# Patient Record
Sex: Female | Born: 1935 | Race: White | Hispanic: No | Marital: Married | Smoking: Former smoker
Health system: Southern US, Community
[De-identification: ages and names within clinical notes are randomized; demographics above are authoritative.]

## PROBLEM LIST (undated history)

## (undated) DIAGNOSIS — F329 Major depressive disorder, single episode, unspecified: Secondary | ICD-10-CM

## (undated) DIAGNOSIS — J449 Chronic obstructive pulmonary disease, unspecified: Secondary | ICD-10-CM

## (undated) DIAGNOSIS — I499 Cardiac arrhythmia, unspecified: Secondary | ICD-10-CM

## (undated) DIAGNOSIS — I10 Essential (primary) hypertension: Secondary | ICD-10-CM

## (undated) DIAGNOSIS — I639 Cerebral infarction, unspecified: Secondary | ICD-10-CM

## (undated) DIAGNOSIS — Z720 Tobacco use: Secondary | ICD-10-CM

## (undated) DIAGNOSIS — K56609 Unspecified intestinal obstruction, unspecified as to partial versus complete obstruction: Secondary | ICD-10-CM

## (undated) DIAGNOSIS — F32A Depression, unspecified: Secondary | ICD-10-CM

## (undated) DIAGNOSIS — Z950 Presence of cardiac pacemaker: Secondary | ICD-10-CM

## (undated) DIAGNOSIS — I779 Disorder of arteries and arterioles, unspecified: Secondary | ICD-10-CM

## (undated) DIAGNOSIS — E7800 Pure hypercholesterolemia, unspecified: Secondary | ICD-10-CM

## (undated) DIAGNOSIS — I739 Peripheral vascular disease, unspecified: Secondary | ICD-10-CM

## (undated) DIAGNOSIS — Z9289 Personal history of other medical treatment: Secondary | ICD-10-CM

## (undated) DIAGNOSIS — I4891 Unspecified atrial fibrillation: Secondary | ICD-10-CM

## (undated) HISTORY — DX: Cerebral infarction, unspecified: I63.9

## (undated) HISTORY — DX: Tobacco use: Z72.0

## (undated) HISTORY — DX: Chronic obstructive pulmonary disease, unspecified: J44.9

## (undated) HISTORY — PX: COLPORRHAPHY: SHX921

## (undated) HISTORY — PX: ABDOMINAL SURGERY: SHX537

## (undated) HISTORY — PX: ABDOMINAL HYSTERECTOMY: SHX81

## (undated) HISTORY — DX: Peripheral vascular disease, unspecified: I73.9

## (undated) HISTORY — PX: APPENDECTOMY: SHX54

## (undated) HISTORY — DX: Disorder of arteries and arterioles, unspecified: I77.9

## (undated) HISTORY — DX: Personal history of other medical treatment: Z92.89

## (undated) HISTORY — PX: TONSILLECTOMY: SUR1361

---

## 1999-04-29 ENCOUNTER — Other Ambulatory Visit: Admission: RE | Admit: 1999-04-29 | Discharge: 1999-04-29 | Payer: Self-pay | Admitting: Obstetrics and Gynecology

## 1999-07-01 ENCOUNTER — Other Ambulatory Visit: Admission: RE | Admit: 1999-07-01 | Discharge: 1999-07-01 | Payer: Self-pay | Admitting: Gastroenterology

## 1999-07-01 ENCOUNTER — Encounter (INDEPENDENT_AMBULATORY_CARE_PROVIDER_SITE_OTHER): Payer: Self-pay

## 2000-03-30 ENCOUNTER — Encounter: Payer: Self-pay | Admitting: Urology

## 2000-03-30 ENCOUNTER — Encounter: Admission: RE | Admit: 2000-03-30 | Discharge: 2000-03-30 | Payer: Self-pay | Admitting: Urology

## 2000-05-04 ENCOUNTER — Other Ambulatory Visit: Admission: RE | Admit: 2000-05-04 | Discharge: 2000-05-04 | Payer: Self-pay | Admitting: Obstetrics and Gynecology

## 2001-01-20 ENCOUNTER — Encounter: Payer: Self-pay | Admitting: Urology

## 2001-01-22 ENCOUNTER — Observation Stay (HOSPITAL_COMMUNITY): Admission: RE | Admit: 2001-01-22 | Discharge: 2001-01-23 | Payer: Self-pay | Admitting: Urology

## 2001-05-05 ENCOUNTER — Other Ambulatory Visit: Admission: RE | Admit: 2001-05-05 | Discharge: 2001-05-05 | Payer: Self-pay | Admitting: Obstetrics and Gynecology

## 2002-05-12 ENCOUNTER — Other Ambulatory Visit (HOSPITAL_COMMUNITY): Admission: RE | Admit: 2002-05-12 | Discharge: 2002-05-14 | Payer: Self-pay | Admitting: Psychiatry

## 2002-05-14 ENCOUNTER — Inpatient Hospital Stay (HOSPITAL_COMMUNITY): Admission: EM | Admit: 2002-05-14 | Discharge: 2002-05-18 | Payer: Self-pay | Admitting: Psychiatry

## 2002-07-13 ENCOUNTER — Encounter: Admission: RE | Admit: 2002-07-13 | Discharge: 2002-07-13 | Payer: Self-pay | Admitting: Psychiatry

## 2002-10-03 ENCOUNTER — Encounter: Admission: RE | Admit: 2002-10-03 | Discharge: 2002-10-03 | Payer: Self-pay | Admitting: Psychiatry

## 2003-01-02 ENCOUNTER — Encounter: Admission: RE | Admit: 2003-01-02 | Discharge: 2003-01-02 | Payer: Self-pay | Admitting: *Deleted

## 2007-04-16 ENCOUNTER — Encounter: Admission: RE | Admit: 2007-04-16 | Discharge: 2007-04-16 | Payer: Self-pay | Admitting: Family Medicine

## 2007-05-03 ENCOUNTER — Encounter: Admission: RE | Admit: 2007-05-03 | Discharge: 2007-05-03 | Payer: Self-pay | Admitting: Sports Medicine

## 2007-08-13 ENCOUNTER — Ambulatory Visit: Payer: Self-pay | Admitting: Internal Medicine

## 2007-08-13 ENCOUNTER — Observation Stay (HOSPITAL_COMMUNITY): Admission: EM | Admit: 2007-08-13 | Discharge: 2007-08-16 | Payer: Self-pay | Admitting: Emergency Medicine

## 2007-08-13 ENCOUNTER — Ambulatory Visit: Payer: Self-pay | Admitting: Surgery

## 2007-08-13 ENCOUNTER — Encounter (INDEPENDENT_AMBULATORY_CARE_PROVIDER_SITE_OTHER): Payer: Self-pay | Admitting: Internal Medicine

## 2008-01-13 ENCOUNTER — Encounter: Admission: RE | Admit: 2008-01-13 | Discharge: 2008-02-23 | Payer: Self-pay | Admitting: Neurology

## 2009-01-23 ENCOUNTER — Encounter: Admission: RE | Admit: 2009-01-23 | Discharge: 2009-01-23 | Payer: Self-pay | Admitting: Urology

## 2009-10-11 ENCOUNTER — Ambulatory Visit (HOSPITAL_COMMUNITY): Admission: RE | Admit: 2009-10-11 | Discharge: 2009-10-11 | Payer: Self-pay | Admitting: Urology

## 2009-11-07 DIAGNOSIS — Z9289 Personal history of other medical treatment: Secondary | ICD-10-CM

## 2009-11-07 HISTORY — DX: Personal history of other medical treatment: Z92.89

## 2010-02-01 ENCOUNTER — Ambulatory Visit (HOSPITAL_COMMUNITY): Admission: RE | Admit: 2010-02-01 | Discharge: 2010-02-01 | Payer: Self-pay | Admitting: Cardiology

## 2010-05-20 ENCOUNTER — Ambulatory Visit
Admission: RE | Admit: 2010-05-20 | Discharge: 2010-05-21 | Payer: Self-pay | Source: Home / Self Care | Attending: Urology | Admitting: Urology

## 2010-05-29 ENCOUNTER — Emergency Department (HOSPITAL_COMMUNITY)
Admission: EM | Admit: 2010-05-29 | Discharge: 2010-05-29 | Payer: Self-pay | Source: Home / Self Care | Admitting: Emergency Medicine

## 2010-07-04 ENCOUNTER — Other Ambulatory Visit: Payer: Self-pay | Admitting: Cardiovascular Disease

## 2010-07-04 DIAGNOSIS — R42 Dizziness and giddiness: Secondary | ICD-10-CM

## 2010-07-11 ENCOUNTER — Ambulatory Visit
Admission: RE | Admit: 2010-07-11 | Discharge: 2010-07-11 | Disposition: A | Discharge status: Home / Self Care | Payer: 59 | Source: Ambulatory Visit | Attending: Cardiovascular Disease | Admitting: Cardiovascular Disease

## 2010-07-11 DIAGNOSIS — R42 Dizziness and giddiness: Secondary | ICD-10-CM

## 2010-08-12 LAB — BASIC METABOLIC PANEL
BUN: 14 mg/dL (ref 6–23)
CO2: 25 mEq/L (ref 19–32)
CO2: 26 mEq/L (ref 19–32)
Calcium: 8.9 mg/dL (ref 8.4–10.5)
Calcium: 9.1 mg/dL (ref 8.4–10.5)
Chloride: 107 mEq/L (ref 96–112)
Chloride: 108 mEq/L (ref 96–112)
Creatinine, Ser: 0.8 mg/dL (ref 0.4–1.2)
GFR calc Af Amer: >60 mL/min (ref >60)
GFR calc Af Amer: >60 mL/min (ref >60)
Glucose, Bld: 120 mg/dL — ABNORMAL HIGH (ref 70–99)
Sodium: 141 mEq/L (ref 135–145)

## 2010-08-12 LAB — DIFFERENTIAL
Basophils Relative: 1 % (ref 0–1)
Eosinophils Absolute: 0.2 10*3/uL (ref 0.0–0.7)
Eosinophils Relative: 2 % (ref 0–5)
Lymphs Abs: 2.1 10*3/uL (ref 0.7–4.0)
Monocytes Absolute: 1 10*3/uL (ref 0.1–1.0)
Monocytes Relative: 11 % (ref 3–12)

## 2010-08-12 LAB — CBC
Hemoglobin: 10.2 g/dL — ABNORMAL LOW (ref 12.0–15.0)
MCH: 30 pg (ref 26.0–34.0)
MCHC: 33.3 g/dL (ref 30.0–36.0)
MCV: 90 fL (ref 78.0–100.0)
RBC: 3.4 MIL/uL — ABNORMAL LOW (ref 3.87–5.11)

## 2010-08-12 LAB — PROTIME-INR
INR: 1.05 (ref 0.00–1.49)
Prothrombin Time: 13.9 seconds (ref 11.6–15.2)

## 2010-08-15 LAB — PROTIME-INR: INR: 2.5 — ABNORMAL HIGH (ref 0.00–1.49)

## 2010-10-15 NOTE — Consult Note (Signed)
NAMEBETTYANNE, [name]               ACCOUNT NO.:  [id number]   MEDICAL RECORD NO.:  [id number]          PATIENT TYPE:  INP   LOCATION:  4729                         FACILITY:  [facility]   PHYSICIAN:  Pramod P. Pearlean Brownie, MD    DATE OF BIRTH:  04/06/1932   DATE OF CONSULTATION:  DATE OF DISCHARGE:                                 CONSULTATION   REASON FOR REFERRAL:  Dizziness.   HISTORY OF PRESENT ILLNESS:  [name] is a 77 year old pleasant  Caucasian lady who was admitted 2 days ago for an episode of dizziness  after she got up from sleep and tried to get up.  She describes  dizziness as a sensation of being off balance.  She denied any nausea,  vertigo, extremity weakness, numbness and double vision.  She has also  been complaining of fatigue, weakness, decreased appetite for the last  couple of weeks.  She denies any known prior history of stroke, TIAs or  significant neurological problems.  She has noticed improvement over the  last couple of days.  She now describes the dizziness only when she  makes a sudden movement, and she gets up and moves her head a sudden  way.  This lasts only for a few seconds and the feeling passes.   PAST MEDICAL HISTORY:  1. Significant for depression.  2. Psychosis.  3. Chronic low-back.  4. Hypertension.  5. __________  6. Macular degeneration.   PAST SURGICAL HISTORY:  1. Cystocele repair.  2. Cataract removal.  3. LASIK surgery.   MEDICATIONS AT HOME:  Atenolol, hydrochlorothiazide, Paxil and Tylenol.   SOCIAL HISTORY:  The patient is retired.  She is married, lives with  husband in [place].  She quit smoking 6 months ago.  She does not  drink.   FAMILY HISTORY:  Unknown.   REVIEW OF SYSTEMS:  As documented above.   PHYSICAL EXAMINATION:  GENERAL:  Reveals a frail, elderly, Caucasian  lady who is at present not in distress.  VITAL SIGNS:  She is febrile today with temperature of 97.8, pulse rate  is 64, regular rate 18, blood pressure  160/98, sats 96% on room air.  HEENT:  Head is nontraumatic.  ENT exam is unremarkable.  NECK:  Supple.  There is no bruit.  CARDIAC;  no murmur or gallop.  LUNGS:  Clear to auscultation.  ABDOMEN:  Soft, nontender.  NEUROLOGIC:  She is pleasant, awake, alert, cooperative.  There is no  aphasia, apraxia or dysarthria.  Pupils irregular but reactive.  Movements are full range.  There is no nystagmus.  Visual acuity and  fields adequate.  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  Air conduction is greater than bone conduction on  the right, but bone conduction is greater than air conduction on the  left.  Tongue is midline.  Motor system exam reveals no upper extremity  drift, symmetric strength, tone, reflexes, coordination sensation.  The  patient is able to walk with a slightly broad-based and very cautious  gait.  She appears very nervous when she is  walking.  She is unsteady on  a  narrow base and when walking tandem.  On head shaking, she complained  of subjective dizziness, but no objective nystagmus was noted.  On  Fukuda stepping test she was clearly moved off base.  Hallpike maneuver  was not done.   DATA REVIEWED:  MRI scan of the brain shows no acute infarct.  MRA of  the brain shows no antegrade flow in the proximal left vertebral artery.  There is some flow in the terminal left vertebral artery which is likely  cross filling from the right side.   IMPRESSION:  A 77 year old lady with intermittent dizziness due to gait  ataxia which is probably a peripheral labyrinthine dysfunction.  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  I would  recommend physical, occupational therapy consults as well as vestibular  rehab.   PLAN:  Start aspirin for stroke prevention and strict control of  hypertension.  I do not believe further invasive diagnostic   neurovascular evaluation is indicated at the present time unless she  clearly has more focal neurovascular symptoms.           ______________________________  Sunny Schlein. Pearlean Brownie, MD     PPS/MEDQ  D:  08/15/2007  T:  08/16/2007  Job:  [id number]

## 2010-10-18 NOTE — Discharge Summary (Signed)
[name], [name]               ACCOUNT NO.:  [id number]   MEDICAL RECORD NO.:  [id number]          PATIENT TYPE:  OBV   LOCATION:  4729                         FACILITY:  [facility]   PHYSICIAN:  Alvester Morin, M.D.  DATE OF BIRTH:  [birth year]   DATE OF ADMISSION:  08/13/2007  DATE OF DISCHARGE:  08/16/2007                               DISCHARGE SUMMARY   DISCHARGE DIAGNOSES:  1. Dizziness likely secondary to labyrinthitis.  2. Hypertension.  3. Constipation.  4. Depression.  5. Failure to thrive.  6. Generalized weakness/deconditioning.   DISCHARGE MEDICATIONS:  Lisinopril 20 mg p.o. daily, Meclizine 12.5 mg  p.o. daily, Colace 1 mg p.o. twice daily, MiraLax 17 grams p.o. daily as  needed for constipation, Paxil 30 mg p.o. daily.   CONSULTATIONS:  [facility] was consulted in regards to the  patient's dizziness.   PROCEDURE PERFORMED:  1. Carotid Dopplers August 13, 2007, indicating no significant internal      carotid stenosis.  2. MRI of brain performed August 14, 2007, indicating no acute process.      There is age related generalized atrophy.  There is chronic small      vessel disease within the hemispheric white matter.  3. MRA of the brain performed August 14, 2007, indicating no acute      anterior circulation pathology.  There is left vertebral artery      occlusion in the neck.  There is retrograde flow from the basilar      back into the left vertebral arteries as far as the PICA.  4.  CT      scan performed August 15, 2007, indicating no acute intracranial      abnormality.   FOLLOW UP:  The patient will follow with Dr. Jeannetta Nap, her primary care  physician, in regards to her dizziness which is, again, likely related  to labyrinthitis.  She should be followed up in regards to her symptoms  and any relief obtained from taking Meclizine.   HISTORY:  [name] is a 77 year old white female with a history of  hypertension and major depressive disorder who  presented to the ED with  an episode of dizziness after waking up from sleep this a.m.  She was  walking to her bathroom and she reports that she felt dizzy while  walking and while coming back from the bathroom, continued to feel dizzy  and had an acute worsening of his.  She did not fall or lose  consciousness.  She did not have any episodes of nausea, vomiting,  diarrhea, fever, abdominal pain, chest pain, shortness of breath prior  to this or after.  She has noted that ever since she began to feel dizzy  this morning she has continually felt dizzy while sitting up.  Again,  while lying down, her dizziness completely resolves.  She has noted that  she has been feeling somewhat more fatigued, weak, and had decreased  p.o. intake and decreased appetite and some social isolation over the  last two weeks.  She feels like her depression is getting worse.   PHYSICAL  EXAMINATION:  VITALS:  Temperature 97, blood pressure 163/70,  pulse 57, respirations 17, and saturation 99% on 2 liters.  GENERAL:  The patient was not in any acute distress.  She is resting  comfortably.  EYES:  Pupils equally round and reactive to light.  Extraocular eye  movements intact.  Peripheral vision intact.  ENT: Oropharynx clear.  There is no erythema or exudates.  NECK:  Supple.  No lymphadenopathy.  No carotid bruits.  RESPIRATORY:  Lungs were clear bilaterally with good air movement.  CARDIOVASCULAR:  Regular rate and rhythm.  No murmurs, rubs or gallops.  GI: Soft, nontender, nondistended.  Good bowel sounds.  EXTREMITIES:  Non-edematous.  SKIN:  No rashes found.  NEURO:  Alert and oriented x3.  Cranial nerves II-XII intact.  Muscle  strength +5 in all extremities.  Reflexes are +2 bilaterally in the  lower extremities.  Sensation is intact to light touch.  Finger-to-nose  test is normal bilaterally.  Gait is not assessed secondary to  dizziness.  PSYCHIATRIC:  The patient is appropriately interactive but  does have a  flat affect.   ADMITTING LABS:  Orthostatic blood pressures are negative for signs of  orthostatic hypotension.  White count 12.3, hemoglobin 15, platelet  count 262.  Sodium 139, potassium 3.2, chloride 103, bicarb 30, BUN 19,  creatinine 0.9, and glucose 111.   HOSPITAL COURSE:  Problem 1:  Dizziness.  The differential for the patient's dizziness  originally included labyrinthitis, benign positional vertigo,  orthostatic hypotension, CVA, cardiogenic causes such as bradycardia.  We obtained orthostatics blood pressures on the patient while she was  continuing to have problems with dizziness and they were negative.  We  obtained an MRI and this did not show any signs of a stroke.  We  obtained carotid Dopplers that were completely normal.  We did obtain a  neurology consult and Dr. Pearlean Brownie found that the patient most likely had  dizziness secondary to peripheral labyrinthitis.  He did note that her  left vertebral artery occlusion was likely unrelated.  He recommended  physical therapy, occupational therapy, aspirin, and a trial of  Meclizine.  After receiving IV fluids and general supportive care, the  patient did have improved symptoms during the course of her stay.  She  was ambulating with assistance and tolerating her Meclizine.  We decided  to let the patient go home with close hospital follow-up with her  regular primary care doctor.  Again, we will continue meclizine in the  hopes that it will help her labyrinthitis and ultimately help her regain  the same function as she had prior to feeling dizzy.   Problem 2:  Hypertension.  The patient's blood pressure was stable  during the course of her stay.  Her systolics were generally held below  160.  We discontinued the patient's atenolol because of worry that it  could be causing bradycardia which ultimately could be contributing to  her dizziness.  We continued the patient on lisinopril 20 mg p.o. daily.  Again, she  will need to be followed up on the therapy in regards to her  blood pressure by her primary care physician.   Problem 3:  Depression. The patient has significant depression with  anhedonia, decreased energy, psychomotor retardation, decreased  appetite, and social isolation.  The patient is refusing any type of  psychotherapy.  She is taking Paxil and during the course of her  hospitalization, we chose to increase the dose to 30 mg  p.o. daily.  Considering that she is refusing psychotherapy, the options of treating  her depression is somewhat limited.  We hope that her primary care  physician can help improve her depressive symptoms in the future.   Problem 4:  Constipation.  The patient has a history of chronic  constipation.  We placed her on Colace and MiraLax.  The last  colonoscopy the patient had was five years ago.  She does not know the  results of that.  She likely needs another colonoscopy.  We have  reviewed her medications and it appears that Paxil can cause  constipation in a significant amount of patients.  Given the severity of  her depression, however, we feel that we should continue the Paxil and  try to treat her constipation symptomatically.  So we continued the  patient's MiraLax and Colace on an outpatient basis.   Discharge vital signs:  Temperature 97.8, blood pressure 160/98, pulse  64, respirations 18, saturation 96% on room air.      Lollie Sails, MD  Electronically Signed      Alvester Morin, M.D.  Electronically Signed    CB/MEDQ  D:  09/15/2007  T:  09/16/2007  Job:  [id number]   cc:   Windle Guard, M.D.

## 2010-10-18 NOTE — Op Note (Signed)
[facility]  Patient:    KYRAN, WHITTIER Visit Number: [id number] MRN: [id number]          Service Type: SUR Location: 3W [id number] 01 Attending Physician:  Londell Moh Proc. Date: 01/22/01 Adm. Date:  01/22/2001 Disc. Date: 01/23/2001                             Operative Report  PREOPERATIVE DIAGNOSES: 1. Stress urinary incontinence. 2. Cystocele.  POSTOPERATIVE DIAGNOSES: 1. Stress urinary incontinence. 2. Cystocele.  PROCEDURE:  Cystoscopy, pubovaginal sling, anterior repair, and suprapubic tube placement.  SURGEON:  Jamison Neighbor, M.D.  ANESTHESIA:  General.  COMPLICATIONS:  None.  DRAINS:  Bonnano Cystocath.  BRIEF HISTORY:  This 77 year old female has been evaluated by me for problems with stress urinary incontinence as well as a large, prolapsing cystocele that has caused a significant amount of lower abdominal pressure and pain.  The patient had a standing cystogram done which showed a large, prolapsing cystocele with an elevated postvoid residual.  It also showed that she did have some leakage of the bladder.  The patient underwent cystoscopic examination.  There were no signs of interstitial cystitis or other sources for pain.  Urodynamic evaluation showed a large capacity bladder with some degree of bladder contractility and irritability trying to empty her bladder, but she did not empty particularly well due to the obstructing cystocele.  The patient is now to undergo correction of the cystocele with placement of a sling.  She understands the risks and benefits of the procedure and gave full and informed consent.  DESCRIPTION OF PROCEDURE:  After the successful induction of general anesthesia, the patient was placed in the dorsal lithotomy position and prepped with Betadine and draped in the usual sterile fashion.  Careful bimanual examination showed that she had a large, prolapsing cystocele, prolapsing well out  beyond the introitus.  There was a modest rectocele which is not symptomatic for the patient.  There were no signs of enterocele.  The anterior vaginal mucosa was infiltrated with local anesthetic.  A weighted vaginal speculum was placed, and the labia were sutured at the medial thigh for exposure.  Foley catheter was inserted.  An incision was made from the mid urethra all the way back to the vaginal cuff.  Flaps were raised bilaterally. The cystocele was developed through sharp dissection.  The space of Retzius was entered on each side, and the urethra was mobilized.  The cardinal ligaments were pulled together posteriorly with Vicryl sutures, and then the cystocele was corrected by correcting the central defect to the middle through the use of a series of Vicryl sutures.  A sling was then constructed out of a 4 x 7 piece of dermis.  This was shaped into a T-shape with the long end connected to #1 nylon sutures.  The patient had a small incision made directly above the pubic bone.  A tonsil clamp was passed through the space of Retzius on each side from the abdominal incision down to the vaginal incision. This was used to pull the #1 sutures up bilaterally.  The sling was positioned and tacked in place with a series of 2-0 Vicryls so that it completely covered over the repair and allowed for elevation of the entire bladder base.  The bottom of the sling was tacked down at the level of the cardinal ligaments. The anterior vaginal mucosa was then trimmed and closed with a series  of 2-0 Vicryl sutures.  The labial sutures were taken down.  The weighted vaginal speculum was removed.  Cystoscopy was performed, and the bladder was carefully inspected.  It was free of any tumor or stones.  The ureteral orifices were identified.  Clear urine was seen to efflux from each.  The bladder base had been distorted by the cystocele repair, but the ureters did not appear to have been injured in any way.   The lateral aspects of the bladder were carefully inspected with both 12 degree and 70 degree lenses.  There was no evidence of any suture material or sling material anywhere within the bladder.  Elevation of the sling showed good correction of the bladder neck.  Under direct vision, a Bonnano Cystocath was passed from a small stab wound into the bladder.  This was sutured in place with nylon sutures.  The sling was then tied down with an appropriate degree of tension.  With a full bladder, a crede maneuver showed a prompt, straight flow of urine, but there was no leakage without crede.  The patient had two fingers that could be placed underneath the sling as it was tied down.  There were 30-45 degrees of free play with the cystoscope, and visual inspection showed that there was coaptation but not angulation of the urethra.  The patient tolerated the procedure well and was taken to the recovery room in good condition.  She had a vaginal pack in place.  She will have 23-hour observation and will be discharged home tomorrow with pain medication as well as doxycycline.  She will be given instructions on how to use her suprapubic tube and will return to see [place] in follow-up. Attending Physician:  Londell Moh DD:  01/22/01 TD:  01/23/01 Job: [id number]

## 2010-10-18 NOTE — H&P (Signed)
[name], [name]                         ACCOUNT NO.:  [id number]   MEDICAL RECORD NO.:  [id number]                   PATIENT TYPE:  IPS   LOCATION:  0303                                 FACILITY:  [facility]   PHYSICIAN:  Geoffery Lyons, M.D.                   DATE OF BIRTH:  10-Dec-1931   DATE OF ADMISSION:  05/13/2002  DATE OF DISCHARGE:                         PSYCHIATRIC ADMISSION ASSESSMENT   DATE OF ASSESSMENT:  May 14, 2002   PATIENT IDENTIFICATION:  This is a 77 year old married white female who is a  voluntary admission.   HISTORY OF PRESENT ILLNESS:  This 77 year old white female with no prior  psychiatric hospitalization had started some treatment in the outpatient  clinic for tearfulness with depressed mood but was found crying yesterday in  the waiting room, holding a chair in the fetal position, and said to the  staff, Please help me, please help me.  The patient apparently has had a  deterioration in her mood with frequent crying episodes and poor  concentration.  She has been unable to focus on her daily activities or  accomplish her activities of daily living with any consistency.  She has  been fearful and anxious today.  She is perseverating on her husband and his  spending habits, feels that he is going spend them into the poor house.  The  patient's mother also died two years ago of Alzheimer's disease and the  patient has had some feelings of guilt about putting her mother in a nursing  home.  The patient also reports that she had a poor relationship with her  mother and that her mother had always told her repeatedly that she had  regretted having her.  The patient also has a history of bladder surgery and  has been recently worried that there might be additional physical problems.  The patient reports that she has some distant history of depression that  apparently was never treated after she had some children.  The patient,  today, reports that she has  been repeatedly fatigued, has not been able to  sleep or have any restful sleep for the past three months.   PAST PSYCHIATRIC HISTORY:  The patient has no history of prior psychiatric  treatment or inpatient hospitalization.  She had been established at [facility] and had one appointment on  the 11th.  At that time, she had been started on Lexapro 5 mg.  She denies  any prior history of suicide attempts.   SUBSTANCE ABUSE HISTORY:  The patient has no history of substance abuse  other than tobacco abuse.  She states she quit smoking approximately two  months ago.   PAST MEDICAL HISTORY:  The patient has been followed by Dr. Jeannetta Nap in  [place], [place].  Medical problems include a history of  hypertension.  The patient is also status post  contusion to her coccyx after  a fall in May 2003 from which she describes chronic pain.  Past medical  history is remarkable for tonsillectomy, adenoidectomy, total abdominal  hysterectomy, appendectomy in the past.  The patient has also had bladder  repair with a bladder sling procedure and she fears that she may have  ruptured this or somehow disrupted this surgical intervention after she fell  in May 2003, although she is unable to describe any specific symptoms  attached to her fears.  The patient has a distant history of multiple  urinary tract infections.   MEDICATIONS:  1. Atenolol 50 mg p.o. daily.  2. Hydrochlorothiazide 25 mg p.o. daily.  3. Lexapro 5 mg daily started December 5.   DRUG ALLERGIES:  SULFA.   REVIEW OF SYSTEMS:  Review of systems is remarkable for some lower back  pain, which is reportedly a 4/10 and is worse when the patient sits for long  periods of time.  She has been taking routine Tylenol or ibuprofen for this  pain.  Her reports of this pain have fluctuated from moderate amounts of  pain if she sits for times and is very tearful to no back pain at all at  the  time the nurses were assessing her.  Review of systems is also remarkable  for significant loss of sleep.  The patient reports she has been unable to  sleep more than two or three hours a night and also has nightmares and night  sweats at night.   PHYSICAL EXAMINATION:  GENERAL:  This is a well nourished, well developed,  medium built female who appears to be her stated age of 16.  She is  generally cooperative but mildly agitated and tearful, unable to collect her  thoughts and has quite a bit of difficulty interviewing because of thought  blocking.  VITAL SIGNS:  On admission to the unit, temperature 97.3, pulse 88, blood  pressure 153/94.  She is 5 feet 3 inches and weighed 134 pounds.  HEENT:  Head is normocephalic, atraumatic.  EENT: Sclerae are nonicteric.  PERRLA.  Hearing intact to normal voice.  No rhinorrhea.  Oropharynx is in  satisfactory condition, noninjected.  NECK:  Supple, no thyromegaly, no masses palpated, full range of motion  without tenderness.  CARDIOVASCULAR:  S1 and S2 heard, no clicks, murmurs, or gallops, no extra  heart sounds.  Apical pulse is synchronous with radial pulse.  No edema of  her lower extremities.  No JVD.  LUNGS:  Clear to auscultation.  ABDOMEN:  Flat, soft, and quiet, no tenderness to palpation, no masses  appreciated.  GENITALIA:  Deferred.  BREAST:  Exam is deferred.  MUSCULOSKELETAL:  Spine is generally straight, posture upright.  Strength is  5/5 throughout.  No swelling or deformity noted of joints, although she does  have some mild joint enlargement in her fingers and her hands.  NEUROLOGIC:  Cranial nerves II-XII are intact.  EOM are intact.  No evidence  of nystagmus.  She does display some psychomotor slowing and some slowed  motor movements, seems to be a bit perplexed and lost at times but motor  movements are generally smooth, no tremor.  Sensory is grossly intact. Facial symmetry is present.  Deep tendon reflexes are 1+/5  and are sluggish  by symmetrical.  Romberg: Without findings.   LABORATORY DATA:  Diagnostic studies reveal the patient had mildly white  blood cell count 11.9, hemoglobin 15.8, hematocrit 45.5.  Her electrolytes  are  within normal limits; BUN 10, creatinine 0.7.  Thyroid panel reveals  normal TSH at 1.253, T4 is mildly elevated at 11.1.  Routine UA and urine  drug screen is currently pending.   SOCIAL HISTORY:  The patient has a ninth grade education.  She reportedly  had a poor relationship with her mother as noted above.  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  The patient has been married for the past 47 years  and feels that generally, she has had a satisfactory marriage.  She has at  least one son age 57 years old who lives in a nearby town.   FAMILY HISTORY:  Family history is unclear.   MENTAL STATUS EXAM:  This is a medium-built, older female whose general  hygiene is satisfactory.  She is fully alert.  She is crying with mild  agitation.  She has significant thought blocking and has difficulty  gathering her thoughts or expressing herself in any way other than to whine  and express her fears.  Speech is mildly pressured.  Her manner is generally  cooperative.  She is directable.  Mood is depressed and tearful.  Thought  process is slowed.  She has trouble processing information or instruction,  needs gentle direction, coaxing, and cuing for all activities.  No overt  evidence of homicidal ideation or overt suicidal ideation.  She does have  significant thought blocking.  Her thoughts are dominated her concerns about  her own physical health and fears that her husband is going to make them  poor with his spending, although she is unable to clarify her thinking into  any specific complaints or goal-oriented actions.  She does seem  to have  possibly some vague paranoia.  No evidence of any auditory hallucinations or  internal distractions.  Cognitive: Intact and oriented x 3.  Her  concentration is poor.  Insight: Poor.  Judgment: Impaired.  Impulse  control: Questionable.  The patient is an unreliable historian.   ADMISSION DIAGNOSES:   AXIS I:  Major depressive disorder, recurrent, severe with  psychosis/agitation.   AXIS II:  No diagnosis.   AXIS III:  1. Chronic low back pain.  2. Hypertension.  3. Rule out urinary tract infection.   AXIS IV:  Deferred.   AXIS V:  Current --, past year --   INITIAL PLAN OF CARE:  Plan is to voluntarily admit the patient with q.66m.  checks in place with goal to alleviate her agitation, improve her focus,  alleviate her tearfulness, and ensure she can be safe and focus and  accomplish her ADLs.  We are going to check a routine urine drug screen and a routine urinalysis.  Meanwhile, we have started her back on her Lexapro 5  mg daily.  We are really unclear if she was taking this at home or not.  We  have also decided to give her Zyprexa 2.5 mg q.8h. p.r.n. for these episodes  of agitation.  We may add a continuous dose later when we see how she  responds.  We also are giving her Ambien 10 mg p.r.n. at h.s. for insomnia.   ESTIMATED LENGTH OF STAY:  Six days.     Margaret A. Scott, N.P.  Geoffery Lyons, M.D.    MAS/MEDQ  D:  05/16/2002  T:  05/16/2002  Job:  [id number]

## 2011-02-24 LAB — CBC
HCT: 43.5
Hemoglobin: 12.6
Hemoglobin: 15
MCHC: 33.7
MCV: 85.9
MCV: 87.2
Platelets: 262
RBC: 4.3
RBC: 5.07
WBC: 12.3 — ABNORMAL HIGH
WBC: 8.1

## 2011-02-24 LAB — POCT I-STAT CREATININE
Creatinine, Ser: 0.9
Operator id: 270651

## 2011-02-24 LAB — I-STAT 8, (EC8 V) (CONVERTED LAB)
BUN: 19
Chloride: 103
Glucose, Bld: 111 — ABNORMAL HIGH
HCT: 47 — ABNORMAL HIGH
Hemoglobin: 16 — ABNORMAL HIGH
Operator id: 270651
pCO2, Ven: 48.9

## 2011-02-24 LAB — BASIC METABOLIC PANEL
CO2: 29
Chloride: 106
GFR calc Af Amer: >60
Sodium: 140

## 2011-02-24 LAB — COMPREHENSIVE METABOLIC PANEL
ALT: 14
Alkaline Phosphatase: 41
CO2: 29
Chloride: 103
GFR calc non Af Amer: >60
Glucose, Bld: 112 — ABNORMAL HIGH
Potassium: 3.5
Sodium: 140
Total Bilirubin: 0.5

## 2011-02-24 LAB — DIFFERENTIAL
Eosinophils Relative: 0
Lymphocytes Relative: 18
Lymphs Abs: 2.2
Monocytes Absolute: 0.7
Monocytes Relative: 6

## 2011-02-24 LAB — CARDIAC PANEL(CRET KIN+CKTOT+MB+TROPI)
CK, MB: 0.8
CK, MB: 0.9
Relative Index: INVALID

## 2011-02-24 LAB — MAGNESIUM
Magnesium: 2
Magnesium: 2.1

## 2011-02-24 LAB — URINALYSIS, ROUTINE W REFLEX MICROSCOPIC
Bilirubin Urine: NEGATIVE
Ketones, ur: NEGATIVE
Nitrite: NEGATIVE
Specific Gravity, Urine: 1.019
Urobilinogen, UA: 1

## 2011-02-24 LAB — SEDIMENTATION RATE: Sed Rate: 1

## 2011-02-24 LAB — CK TOTAL AND CKMB (NOT AT ARMC): Relative Index: INVALID

## 2011-09-29 ENCOUNTER — Encounter (INDEPENDENT_AMBULATORY_CARE_PROVIDER_SITE_OTHER): Payer: Medicare Other | Admitting: Ophthalmology

## 2011-09-29 DIAGNOSIS — H43819 Vitreous degeneration, unspecified eye: Secondary | ICD-10-CM

## 2011-09-29 DIAGNOSIS — H3530 Unspecified macular degeneration: Secondary | ICD-10-CM

## 2011-09-29 DIAGNOSIS — H26499 Other secondary cataract, unspecified eye: Secondary | ICD-10-CM

## 2011-10-07 ENCOUNTER — Ambulatory Visit (INDEPENDENT_AMBULATORY_CARE_PROVIDER_SITE_OTHER): Payer: Medicare Other | Admitting: Ophthalmology

## 2011-10-09 ENCOUNTER — Ambulatory Visit (INDEPENDENT_AMBULATORY_CARE_PROVIDER_SITE_OTHER): Payer: Medicare Other | Admitting: Ophthalmology

## 2011-10-09 DIAGNOSIS — H2700 Aphakia, unspecified eye: Secondary | ICD-10-CM

## 2011-10-09 DIAGNOSIS — H3530 Unspecified macular degeneration: Secondary | ICD-10-CM

## 2012-05-17 ENCOUNTER — Emergency Department (HOSPITAL_COMMUNITY): Payer: Medicare Other

## 2012-05-17 ENCOUNTER — Encounter (HOSPITAL_COMMUNITY): Payer: Self-pay | Admitting: Adult Health

## 2012-05-17 ENCOUNTER — Inpatient Hospital Stay (HOSPITAL_COMMUNITY)
Admission: EM | Admit: 2012-05-17 | Discharge: 2012-05-24 | DRG: 193 | Disposition: A | Discharge status: Skilled Nursing Facility | Payer: Medicare Other | Attending: Internal Medicine | Admitting: Internal Medicine

## 2012-05-17 DIAGNOSIS — I480 Paroxysmal atrial fibrillation: Secondary | ICD-10-CM | POA: Diagnosis present

## 2012-05-17 DIAGNOSIS — I5033 Acute on chronic diastolic (congestive) heart failure: Secondary | ICD-10-CM | POA: Diagnosis present

## 2012-05-17 DIAGNOSIS — I5031 Acute diastolic (congestive) heart failure: Secondary | ICD-10-CM

## 2012-05-17 DIAGNOSIS — I2510 Atherosclerotic heart disease of native coronary artery without angina pectoris: Secondary | ICD-10-CM | POA: Diagnosis present

## 2012-05-17 DIAGNOSIS — J9691 Respiratory failure, unspecified with hypoxia: Secondary | ICD-10-CM

## 2012-05-17 DIAGNOSIS — F3289 Other specified depressive episodes: Secondary | ICD-10-CM | POA: Diagnosis present

## 2012-05-17 DIAGNOSIS — A419 Sepsis, unspecified organism: Secondary | ICD-10-CM

## 2012-05-17 DIAGNOSIS — I4891 Unspecified atrial fibrillation: Secondary | ICD-10-CM | POA: Diagnosis present

## 2012-05-17 DIAGNOSIS — I509 Heart failure, unspecified: Secondary | ICD-10-CM

## 2012-05-17 DIAGNOSIS — J9611 Chronic respiratory failure with hypoxia: Secondary | ICD-10-CM | POA: Diagnosis present

## 2012-05-17 DIAGNOSIS — E876 Hypokalemia: Secondary | ICD-10-CM | POA: Diagnosis present

## 2012-05-17 DIAGNOSIS — I10 Essential (primary) hypertension: Secondary | ICD-10-CM | POA: Diagnosis present

## 2012-05-17 DIAGNOSIS — J811 Chronic pulmonary edema: Secondary | ICD-10-CM

## 2012-05-17 DIAGNOSIS — Z7902 Long term (current) use of antithrombotics/antiplatelets: Secondary | ICD-10-CM

## 2012-05-17 DIAGNOSIS — E7800 Pure hypercholesterolemia, unspecified: Secondary | ICD-10-CM | POA: Diagnosis present

## 2012-05-17 DIAGNOSIS — Z79899 Other long term (current) drug therapy: Secondary | ICD-10-CM

## 2012-05-17 DIAGNOSIS — Z7982 Long term (current) use of aspirin: Secondary | ICD-10-CM

## 2012-05-17 DIAGNOSIS — J9600 Acute respiratory failure, unspecified whether with hypoxia or hypercapnia: Secondary | ICD-10-CM | POA: Diagnosis present

## 2012-05-17 DIAGNOSIS — F329 Major depressive disorder, single episode, unspecified: Secondary | ICD-10-CM | POA: Diagnosis present

## 2012-05-17 DIAGNOSIS — D72829 Elevated white blood cell count, unspecified: Secondary | ICD-10-CM | POA: Diagnosis present

## 2012-05-17 DIAGNOSIS — J8410 Pulmonary fibrosis, unspecified: Secondary | ICD-10-CM | POA: Diagnosis present

## 2012-05-17 DIAGNOSIS — J189 Pneumonia, unspecified organism: Principal | ICD-10-CM | POA: Diagnosis present

## 2012-05-17 HISTORY — DX: Pure hypercholesterolemia, unspecified: E78.00

## 2012-05-17 HISTORY — DX: Depression, unspecified: F32.A

## 2012-05-17 HISTORY — DX: Cardiac arrhythmia, unspecified: I49.9

## 2012-05-17 HISTORY — DX: Essential (primary) hypertension: I10

## 2012-05-17 HISTORY — DX: Unspecified atrial fibrillation: I48.91

## 2012-05-17 HISTORY — DX: Major depressive disorder, single episode, unspecified: F32.9

## 2012-05-17 LAB — URINE MICROSCOPIC-ADD ON

## 2012-05-17 LAB — BASIC METABOLIC PANEL
Calcium: 8.8 mg/dL (ref 8.4–10.5)
GFR calc Af Amer: >90 mL/min (ref >90)
GFR calc non Af Amer: 85 mL/min — ABNORMAL LOW (ref >90)
Glucose, Bld: 143 mg/dL — ABNORMAL HIGH (ref 70–99)
Potassium: 3 mEq/L — ABNORMAL LOW (ref 3.5–5.1)
Sodium: 137 mEq/L (ref 135–145)

## 2012-05-17 LAB — POCT I-STAT 3, ART BLOOD GAS (G3+)
Acid-Base Excess: 5 mmol/L — ABNORMAL HIGH (ref 0.0–2.0)
Bicarbonate: 26.8 mEq/L — ABNORMAL HIGH (ref 20.0–24.0)
O2 Saturation: 95 %
Patient temperature: 98.4
TCO2: 28 mmol/L (ref 0–100)
pO2, Arterial: 66 mmHg — ABNORMAL LOW (ref 80.0–100.0)

## 2012-05-17 LAB — URINALYSIS, ROUTINE W REFLEX MICROSCOPIC
Glucose, UA: NEGATIVE mg/dL
Nitrite: NEGATIVE
Nitrite: NEGATIVE
Protein, ur: NEGATIVE mg/dL
Specific Gravity, Urine: 1.006 (ref 1.005–1.030)
Specific Gravity, Urine: 1.006 (ref 1.005–1.030)
Urobilinogen, UA: 1 mg/dL (ref 0.0–1.0)
pH: 7.5 (ref 5.0–8.0)

## 2012-05-17 LAB — CBC
Hemoglobin: 13.9 g/dL (ref 12.0–15.0)
MCH: 29.2 pg (ref 26.0–34.0)
MCHC: 33.9 g/dL (ref 30.0–36.0)
Platelets: 377 10*3/uL (ref 150–400)
RBC: 4.76 MIL/uL (ref 3.87–5.11)

## 2012-05-17 LAB — POCT I-STAT TROPONIN I: Troponin i, poc: 0.06 ng/mL (ref 0.00–0.08)

## 2012-05-17 MED ORDER — LEVOFLOXACIN IN D5W 750 MG/150ML IV SOLN
750.0000 mg | Freq: Once | INTRAVENOUS | Status: AC
  Administered 2012-05-17: 750 mg via INTRAVENOUS
  Filled 2012-05-17: qty 150

## 2012-05-17 MED ORDER — ASPIRIN 81 MG PO CHEW
CHEWABLE_TABLET | ORAL | Status: AC
  Filled 2012-05-17: qty 1

## 2012-05-17 MED ORDER — SODIUM CHLORIDE 0.9 % IV SOLN
1000.0000 mL | Freq: Once | INTRAVENOUS | Status: AC
  Administered 2012-05-17: 1000 mL via INTRAVENOUS

## 2012-05-17 MED ORDER — SODIUM CHLORIDE 0.9 % IV SOLN
1000.0000 mL | INTRAVENOUS | Status: DC
  Administered 2012-05-17: 1000 mL via INTRAVENOUS

## 2012-05-17 MED ORDER — DEXTROSE 5 % IV SOLN
2.0000 g | Freq: Once | INTRAVENOUS | Status: AC
  Administered 2012-05-17: 2 g via INTRAVENOUS
  Filled 2012-05-17 (×2): qty 2

## 2012-05-17 MED ORDER — DIGOXIN 125 MCG PO TABS
0.5000 mg | ORAL_TABLET | Freq: Once | ORAL | Status: AC
  Administered 2012-05-17: 0.5 mg via ORAL
  Filled 2012-05-17: qty 4

## 2012-05-17 MED ORDER — ALBUTEROL SULFATE (5 MG/ML) 0.5% IN NEBU
2.5000 mg | INHALATION_SOLUTION | RESPIRATORY_TRACT | Status: DC
  Administered 2012-05-17 – 2012-05-20 (×12): 2.5 mg via RESPIRATORY_TRACT
  Filled 2012-05-17 (×11): qty 0.5

## 2012-05-17 MED ORDER — FUROSEMIDE 10 MG/ML IJ SOLN
40.0000 mg | Freq: Two times a day (BID) | INTRAMUSCULAR | Status: AC
  Administered 2012-05-17 – 2012-05-19 (×4): 40 mg via INTRAVENOUS
  Filled 2012-05-17 (×4): qty 4

## 2012-05-17 MED ORDER — ASPIRIN EC 81 MG PO TBEC
81.0000 mg | DELAYED_RELEASE_TABLET | Freq: Every day | ORAL | Status: DC
  Administered 2012-05-18 – 2012-05-24 (×7): 81 mg via ORAL
  Filled 2012-05-17 (×9): qty 1

## 2012-05-17 MED ORDER — POTASSIUM CHLORIDE CRYS ER 20 MEQ PO TBCR
80.0000 meq | EXTENDED_RELEASE_TABLET | Freq: Once | ORAL | Status: AC
  Administered 2012-05-17: 80 meq via ORAL
  Filled 2012-05-17: qty 4

## 2012-05-17 MED ORDER — IPRATROPIUM BROMIDE 0.02 % IN SOLN
0.5000 mg | RESPIRATORY_TRACT | Status: DC | PRN
  Administered 2012-05-18: 0.5 mg via RESPIRATORY_TRACT
  Filled 2012-05-17 (×2): qty 2.5

## 2012-05-17 MED ORDER — IPRATROPIUM BROMIDE 0.02 % IN SOLN
0.5000 mg | RESPIRATORY_TRACT | Status: DC
  Administered 2012-05-17 – 2012-05-20 (×14): 0.5 mg via RESPIRATORY_TRACT
  Filled 2012-05-17 (×15): qty 2.5

## 2012-05-17 MED ORDER — DEXTROSE 5 % IV SOLN
2.0000 g | Freq: Three times a day (TID) | INTRAVENOUS | Status: DC
  Administered 2012-05-18 – 2012-05-20 (×8): 2 g via INTRAVENOUS
  Filled 2012-05-17 (×10): qty 2

## 2012-05-17 MED ORDER — BIOTENE DRY MOUTH MT LIQD
15.0000 mL | Freq: Two times a day (BID) | OROMUCOSAL | Status: DC
  Administered 2012-05-17 – 2012-05-20 (×6): 15 mL via OROMUCOSAL

## 2012-05-17 MED ORDER — LEVOFLOXACIN IN D5W 750 MG/150ML IV SOLN
750.0000 mg | INTRAVENOUS | Status: DC
  Administered 2012-05-18 – 2012-05-20 (×3): 750 mg via INTRAVENOUS
  Filled 2012-05-17 (×4): qty 150

## 2012-05-17 MED ORDER — VANCOMYCIN HCL IN DEXTROSE 1-5 GM/200ML-% IV SOLN
1000.0000 mg | INTRAVENOUS | Status: DC
  Administered 2012-05-18: 1000 mg via INTRAVENOUS
  Filled 2012-05-17 (×2): qty 200

## 2012-05-17 MED ORDER — SIMVASTATIN 20 MG PO TABS
20.0000 mg | ORAL_TABLET | Freq: Every day | ORAL | Status: DC
  Administered 2012-05-18 – 2012-05-19 (×2): 20 mg via ORAL
  Filled 2012-05-17 (×3): qty 1

## 2012-05-17 MED ORDER — HEPARIN SODIUM (PORCINE) 5000 UNIT/ML IJ SOLN
5000.0000 [IU] | Freq: Three times a day (TID) | INTRAMUSCULAR | Status: DC
  Administered 2012-05-18 – 2012-05-19 (×6): 5000 [IU] via SUBCUTANEOUS
  Filled 2012-05-17 (×7): qty 1

## 2012-05-17 MED ORDER — FUROSEMIDE 10 MG/ML IJ SOLN
40.0000 mg | Freq: Once | INTRAMUSCULAR | Status: AC
  Administered 2012-05-17: 40 mg via INTRAVENOUS
  Filled 2012-05-17: qty 4

## 2012-05-17 MED ORDER — ALBUTEROL SULFATE (5 MG/ML) 0.5% IN NEBU
2.5000 mg | INHALATION_SOLUTION | RESPIRATORY_TRACT | Status: DC
  Administered 2012-05-17 (×2): 2.5 mg via RESPIRATORY_TRACT
  Filled 2012-05-17 (×3): qty 0.5

## 2012-05-17 MED ORDER — ALBUTEROL SULFATE (5 MG/ML) 0.5% IN NEBU
2.5000 mg | INHALATION_SOLUTION | RESPIRATORY_TRACT | Status: DC | PRN
  Administered 2012-05-18: 2.5 mg via RESPIRATORY_TRACT
  Filled 2012-05-17 (×2): qty 0.5

## 2012-05-17 MED ORDER — CLOPIDOGREL BISULFATE 75 MG PO TABS
75.0000 mg | ORAL_TABLET | Freq: Every day | ORAL | Status: DC
  Administered 2012-05-18 – 2012-05-20 (×4): 75 mg via ORAL
  Filled 2012-05-17 (×6): qty 1

## 2012-05-17 MED ORDER — DILTIAZEM HCL 100 MG IV SOLR
5.0000 mg/h | INTRAVENOUS | Status: DC
  Administered 2012-05-17: 5 mg/h via INTRAVENOUS
  Administered 2012-05-17: 15 mg/h via INTRAVENOUS
  Administered 2012-05-18: 5 mg/h via INTRAVENOUS
  Filled 2012-05-17 (×2): qty 100

## 2012-05-17 NOTE — ED Notes (Signed)
One week of Shortness of breath. Went to Dr. Isidore Moos today and sent here for further evaluation. Pt is tachypneic, with accessory muscle use sats 94-96% on 2 liters. Heart rate 130s. +cough that is productive thick phlegm, c/o body aches.

## 2012-05-17 NOTE — ED Notes (Signed)
istat lactic acid reported to Dr.Lockwood  

## 2012-05-17 NOTE — H&P (Signed)
PULMONARY  / CRITICAL CARE MEDICINE  Name: [name] MRN: [id number] DOB: [birth year]    LOS: 0  REFERRING MD:  EDP  CHIEF COMPLAINT:  Acute dyspnea  BRIEF PATIENT DESCRIPTION: 78 yo with history of atrial fibrillation who presented to [facility] ED om 12/16 with dyspnea after being seen by her PCP.  Seen one week ago with influenza symptoms and started on Tamiflu.  Since then had increased respiratory symptoms, weakness and decreased fluid intake as well as fevers. Denies chest pain or pedal edema. In ED noted to be in atrial fibrillation with RVR.  LINES / TUBES: 12/16  Foley  CULTURES: 12/16  Urine >>> 12/16  Blood >>>  ANTIBIOTICS: Aztreonam (pneumonia, PCN allergy) >>> Levaquin (pneumonia, atypical) >>>  Vancomycin (pneumonia, community MRSA) >>>  SIGNIFICANT EVENTS:  12/16  Admitted with pneumonia / pulmonary edema, afib/RVR, impending respiratory failure  LEVEL OF CARE:  ICU  PRIMARY SERVICE:  PCCN  CONSULTANTS:    CODE STATUS:  Full  DIET:  NPO  DVT Px:  Heparin  GI Px:  Not indicated  HISTORY OF PRESENT ILLNESS:  78 yo with history of atrial fibrillation who presented to [facility] ED om 12/16 with dyspnea after being seen by her PCP.  Seen one week ago with influenza symptoms and started on Tamiflu.  Since then had increased respiratory symptoms, weakness and decreased fluid intake as well as fevers.  In ED noted to be in atrial fibrillation with RVR.  Denies chest pain or pedal edema.  PAST MEDICAL HISTORY :  Past Medical History  Diagnosis Date  . Irregular heart beat   . Hypertension   . Hypercholesteremia   . Depression   . Atrial fibrillation    History reviewed. No pertinent past surgical history. Prior to Admission medications   Medication Sig Start Date End Date Taking? Authorizing Provider  acetaminophen (TYLENOL) 500 MG tablet Take 500 mg by mouth at bedtime as needed. For pain to help sleep   Yes Historical Provider, MD  aspirin EC 81 MG tablet Take  81 mg by mouth daily.   Yes Historical Provider, MD  clopidogrel (PLAVIX) 75 MG tablet Take 75 mg by mouth daily.   Yes Historical Provider, MD  diltiazem (CARDIZEM CD) 180 MG 24 hr capsule Take 180 mg by mouth daily.   Yes Historical Provider, MD  lisinopril (PRINIVIL,ZESTRIL) 10 MG tablet Take 10 mg by mouth daily.   Yes Historical Provider, MD  pravastatin (PRAVACHOL) 40 MG tablet Take 40 mg by mouth daily.   Yes Historical Provider, MD   Allergies  Allergen Reactions  . Amoxicillin     Can't remember reaction  . Doxycycline     Can't remember reaction  . Penicillins     Can't remember reaction  . Sulfa Antibiotics     Can't remember reaction   FAMILY HISTORY:  History reviewed. No pertinent family history.  SOCIAL HISTORY:  reports that she has never smoked. She does not have any smokeless tobacco history on file. She reports that she does not drink alcohol or use illicit drugs.  REVIEW OF SYSTEMS:  Negative, except as in HPI.  INTERVAL HISTORY:  VITAL SIGNS: Temp:  [98.4 F (36.9 C)-100 F (37.8 C)] 98.4 F (36.9 C) (12/16 1830) Pulse Rate:  [89-148] 113  (12/16 2130) Resp:  [25-39] 26  (12/16 2130) BP: (95-138)/(64-98) 122/75 mmHg (12/16 2130) SpO2:  [91 %-95 %] 92 % (12/16 2130) HEMODYNAMICS:   VENTILATOR SETTINGS:   INTAKE / OUTPUT:  Intake/Output    None    PHYSICAL EXAMINATION: General:  Appears acutely ill, increased work of breathing Neuro:  Somnolent, but arouses to stimulation, nonfocal HEENT:  PERRL Cardiovascular:  Irregular, rappid Lungs:  Bilateral diminished air entry, rales, wheezes Abdomen:  Soft, nontender, bowel sounds present Musculoskeletal:  Moves all extremities, no edema Skin:  Intact  LABS:  Lab 05/17/12 1648 05/17/12 1615  HGB -- 13.9  WBC -- 19.9*  PLT -- 377  NA -- 137  K -- 3.0*  CL -- 97  CO2 -- 26  GLUCOSE -- 143*  BUN -- 11  CREATININE -- 0.60  CALCIUM -- 8.8  MG -- --  PHOS -- --  AST -- --  ALT -- --  ALKPHOS  -- --  BILITOT -- --  PROT -- --  ALBUMIN -- --  APTT -- --  INR -- --  LATICACIDVEN 1.61 --  TROPONINI -- --  PROCALCITON -- --  PROBNP -- 1688.0*  O2SATVEN -- --  PHART -- --  PCO2ART -- --  PO2ART -- --   No results found for this basename: GLUCAP:5 in the last 168 hours  IMAGING: 12/16  Alveolar / interstitial airspace disease  ECG:  12/16 >>> Afib/RVR, nonspecific ST-T changes  DIAGNOSES: Active Problems:  Atrial fibrillation with rapid ventricular response  Pulmonary edema  Congestive heart failure  CAP (community acquired pneumonia)  Respiratory failure with hypoxia  Hypokalemia  ASSESSMENT / PLAN:  PULMONARY  A:  CAP.  Acute pulmonary edema. P:   Gaol SpO2>92 Supplemental oxygen Albuterol / Atrovent ABG CXR in AM  CARDIOVASCULAR  A: Atrial fibrillation with RVR.  Suspected acute diastolic CHF.  CAD. R/o ACS.   P:  Goal MAP>60 Trend troponin 2D echo Cardizem gtt Continue ASA, Plavix, Pravachol Hold Lisinopril - risk of hypotension with diuresis Given Digoxin IV and Metoprolol IV x 1  RENAL  A:  Normal renal function.  Hypokalemia. P:   Trend BMP Mg level Lasix 40 bid x 2 days  GASTROINTESTINAL  A:  No active issues. P:   NPO as at risk for intubation  HEMATOLOGIC  A:  Leukocytosis. P:  Trend CBC  INFECTIOUS  A:  CAP.  Treated empirically for influenza x 1 week. P:   Cultures and antibiotics as above PCT Rapid flu  ENDOCRINE   A:  No active issues. P:   No interventions required  NEUROLOGIC  A:  No active issues. P:   No interventions required  CLINICAL SUMMARY: 77 yo with history of atrial fibrillation who presented to [facility] ED om 12/16 with dyspnea after being seen by her PCP.  Seen one week prior with influenza symptoms and started on Tamiflu.  Since then had increased respiratory symptoms, weakness and decreased fluid intake as well as fevers. Denies chest pain or pedal edema. In ED noted to be in atrial  fibrillation with RVR.  Hypoxemic respiratory failure.  Empirical antibiotics for CAP.  Diuresis.  Cardizem gtt. 2D echo / cardiac enzymes. Possible BiPAP / intubation.  I have personally obtained a history, examined the patient, evaluated laboratory and imaging results, formulated the assessment and plan and placed orders.  CRITICAL CARE:  The patient is critically ill with multiple organ systems failure and requires high complexity decision making for assessment and support, frequent evaluation and titration of therapies, application of advanced monitoring technologies and extensive interpretation of multiple databases. Critical Care Time devoted to patient care services described in this note is 45 minutes.  Lonia Farber, MD  Pulmonary and Critical Care Medicine [facility] Pager: [phone]  05/17/2012, 9:57 PM

## 2012-05-17 NOTE — ED Provider Notes (Signed)
History     CSN: [id number]  Arrival date & time 05/17/12  1547   First MD Initiated Contact with Patient 05/17/12 1620      Chief Complaint  Patient presents with  . Shortness of Breath    (Consider location/radiation/quality/duration/timing/severity/associated sxs/prior treatment) HPI 78 year old female presents to the emergency department with difficulty breathing.  Patient was sent over from her primary care physician at [facility].  Patient was seen one week ago with flulike symptoms and started on Tamiflu.  She had worsening of upper respiratory symptoms weakness, decreased appetite and fluid intake.  Had some sick Subjective fevers.  Today she felt extremely short of breath and weak.  Her initial EKG showed A. fib with RVR.  She has a history of atrial fibrillation. Family is present and states that patient is on blood thinner,but they are unsure of which one.  Denies CP, radiation to left arm, jaw or back, or diaphoresis. Denies dysuria, flank pain, suprapubic pain, frequency, urgency, or hematuria. Denies headaches, light headedness, weakness, visual disturbances. Denies abdominal pain, nausea, vomiting, diarrhea or constipation.   Past Medical History  Diagnosis Date  . Irregular heart beat   . Hypertension   . Hypercholesteremia   . Depression   . Atrial fibrillation     History reviewed. No pertinent past surgical history.  History reviewed. No pertinent family history.  History  Substance Use Topics  . Smoking status: Never Smoker   . Smokeless tobacco: Not on file  . Alcohol Use: No    OB History    Grav Para Term Preterm Abortions TAB SAB Ect Mult Living                  Review of Systems Ten systems reviewed and are negative for acute change, except as noted in the HPI.   Allergies  Amoxicillin; Doxycycline; Penicillins; and Sulfa antibiotics  Home Medications  No current outpatient prescriptions on file.  BP 138/98  Pulse  137  Temp 100 F (37.8 C) (Oral)  Resp 35  SpO2 93%  Physical Exam  Nursing note and vitals reviewed. Constitutional: She is oriented to person, place, and time. She appears distressed.       She appears acutely ill.   HENT:  Head: Normocephalic and atraumatic.  Eyes: Conjunctivae normal are normal. No scleral icterus.       Very dry mucous membranes  Neck: Normal range of motion.  Cardiovascular: Intact distal pulses.  Exam reveals no gallop and no friction rub.   No murmur heard.      Irregular rhythm. Rapid rate  Pulmonary/Chest:       Tachypneic, using accesory mm. Diffuse inspiratory and expiratory wheezes. Productive cough  Abdominal: Soft. Bowel sounds are normal. She exhibits no distension and no mass. There is no tenderness. There is no guarding.  Musculoskeletal:       No peripheral edema   Neurological: She is alert and oriented to person, place, and time.  Skin: Skin is warm.    ED Course  Procedures (including critical care time) CRITICAL CARE Performed by: Arthor Captain   Total critical care time: 60  Critical care time was exclusive of separately billable procedures and treating other patients.  Critical care was necessary to treat or prevent imminent or life-threatening deterioration.  Critical care was time spent personally by me on the following activities: development of treatment plan with patient and/or surrogate as well as nursing, discussions with consultants, evaluation of patient's response  to treatment, examination of patient, obtaining history from patient or surrogate, ordering and performing treatments and interventions, ordering and review of laboratory studies, ordering and review of radiographic studies, pulse oximetry and re-evaluation of patient's condition.  Labs Reviewed  CBC - Abnormal; Notable for the following:    WBC 19.9 (*)     All other components within normal limits  BASIC METABOLIC PANEL - Abnormal; Notable for the  following:    Potassium 3.0 (*)     Glucose, Bld 143 (*)     GFR calc non Af Amer 85 (*)     All other components within normal limits  URINALYSIS, ROUTINE W REFLEX MICROSCOPIC - Abnormal; Notable for the following:    Hgb urine dipstick SMALL (*)     Leukocytes, UA SMALL (*)     All other components within normal limits  PRO B NATRIURETIC PEPTIDE - Abnormal; Notable for the following:    Pro B Natriuretic peptide (BNP) 1688.0 (*)     All other components within normal limits  CBC - Abnormal; Notable for the following:    WBC 17.4 (*)     Hemoglobin 11.9 (*)     HCT 35.7 (*)     All other components within normal limits  BASIC METABOLIC PANEL - Abnormal; Notable for the following:    Potassium 3.3 (*)     Glucose, Bld 130 (*)     Calcium 8.1 (*)     GFR calc non Af Amer 82 (*)     All other components within normal limits  URINALYSIS, ROUTINE W REFLEX MICROSCOPIC - Abnormal; Notable for the following:    APPearance CLOUDY (*)     Hgb urine dipstick TRACE (*)     Leukocytes, UA SMALL (*)     All other components within normal limits  POCT I-STAT 3, BLOOD GAS (G3+) - Abnormal; Notable for the following:    pH, Arterial 7.539 (*)     pCO2 arterial 31.5 (*)     pO2, Arterial 66.0 (*)     Bicarbonate 26.8 (*)     Acid-Base Excess 5.0 (*)     All other components within normal limits  GLUCOSE, CAPILLARY - Abnormal; Notable for the following:    Glucose-Capillary 133 (*)     All other components within normal limits  CULTURE, BLOOD (ROUTINE X 2)  CULTURE, BLOOD (ROUTINE X 2)  CG4 I-STAT (LACTIC ACID)  POCT I-STAT TROPONIN I  PHOSPHORUS  MAGNESIUM  PROCALCITONIN  TROPONIN I  TROPONIN I  TROPONIN I  URINE MICROSCOPIC-ADD ON  MRSA PCR SCREENING  INFLUENZA PANEL BY PCR  URINE MICROSCOPIC-ADD ON  PROCALCITONIN  TSH  URINE CULTURE  PROCALCITONIN  CBC WITH DIFFERENTIAL  COMPREHENSIVE METABOLIC PANEL   No results found.   1. Sepsis   2. Community acquired pneumonia    3. A-fib   4. Hypokalemia   5. Atrial fibrillation with rapid ventricular response   6. CAP (community acquired pneumonia)   7. Congestive heart failure   8. Pulmonary edema   9. Respiratory failure with hypoxia       Date: 05/17/2012  Rate: 130  Rhythm: atrial fibrillation  QRS Axis: left  Intervals: normal  ST/T Wave abnormalities: normal  Conduction Disutrbances:none  Narrative Interpretation: Afib With RVR  Old EKG Reviewed: none available    MDM  4:58 PM Filed Vitals:   05/17/12 1556 05/17/12 1630  BP: 138/98   Pulse:  137  Temp: 100 F (37.8 C)  TempSrc: Oral   Resp: 36 35  SpO2: 92% 93%   Patient with suspected pneumonia, Possible sepsis and afib with RVR. I have ordered a repeat EKG. Lactate is normal. Pending CXR.   5:41 PM Patient with confirmed pneumonia and fitting sepsis criteria. I have begun treatment with azactam and levaquin. Starting diltiazem. Patient appears to be breathing better, but still feels SOB. She is more tachycardic and still has diffuse wheezes. I am witholding further tx as i would like to bring her HR down     7:51 PM Filed Vitals:   05/17/12 1945  BP: 119/77  Pulse: 116  Temp:   Resp: 31   I have spoken with Dr. Blima Dessert. WHo feels that some of her sxs may be due  to pulmonary vascular congestion. He asks that we administer 40 ov Lasix and digoxin 0.5 mg.   Patient will be admitted by CCM. The patient appears reasonably stabilized for admission considering the current resources, flow, and capabilities available in the ED at this time, and I doubt any other [facility] requiring further screening and/or treatment in the ED prior to admission.   Arthor Captain, PA-C 05/18/12 [phone]

## 2012-05-17 NOTE — Progress Notes (Signed)
ANTIBIOTIC CONSULT NOTE - INITIAL  Pharmacy Consult for Vancomycin Indication: pneumonia  Allergies  Allergen Reactions  . Amoxicillin     Can't remember reaction  . Doxycycline     Can't remember reaction  . Penicillins     Can't remember reaction  . Sulfa Antibiotics     Can't remember reaction    Patient Measurements: Height: 5' (152.4 cm) Weight: 148 lb (67.132 kg) IBW/kg (Calculated) : 45.5   Vital Signs: Temp: 98.4 F (36.9 C) (12/16 1830) Temp src: Oral (12/16 1830) BP: 116/56 mmHg (12/16 2200) Pulse Rate: 112  (12/16 2200) Intake/Output from previous day:   Intake/Output from this shift:    Labs:  [facility] 05/17/12 1615  WBC 19.9*  HGB 13.9  PLT 377  LABCREA --  CREATININE 0.60   Estimated Creatinine Clearance: 49.5 ml/min (by C-G formula based on Cr of 0.6). No results found for this basename: VANCOTROUGH:2,VANCOPEAK:2,VANCORANDOM:2,GENTTROUGH:2,GENTPEAK:2,GENTRANDOM:2,TOBRATROUGH:2,TOBRAPEAK:2,TOBRARND:2,AMIKACINPEAK:2,AMIKACINTROU:2,AMIKACIN:2, in the last 72 hours   Microbiology: No results found for this or any previous visit (from the past 720 hour(s)).  Medical History: Past Medical History  Diagnosis Date  . Irregular heart beat   . Hypertension   . Hypercholesteremia   . Depression   . Atrial fibrillation     Medications:   (Not in a hospital admission) Assessment: 78 y/o female patient admitted with shortness of breath, tachypnea and leukocytosis requiring broad spectrum antibiotics for pneumonia.  Goal of Therapy:  Vancomycin trough level 15-20 mcg/ml  Plan:  Vancomycin 1g IV q24, monitor renal function and f/u c&s. Measure antibiotic drug levels at steady state  Verlene Mayer, PharmD, [place] Pager [phone] 05/17/2012,10:11 PM

## 2012-05-17 NOTE — ED Notes (Signed)
CCM at bedside 

## 2012-05-18 ENCOUNTER — Inpatient Hospital Stay (HOSPITAL_COMMUNITY): Payer: Medicare Other

## 2012-05-18 DIAGNOSIS — A419 Sepsis, unspecified organism: Secondary | ICD-10-CM

## 2012-05-18 LAB — PHOSPHORUS: Phosphorus: 2.4 mg/dL (ref 2.3–4.6)

## 2012-05-18 LAB — MAGNESIUM: Magnesium: 1.6 mg/dL (ref 1.5–2.5)

## 2012-05-18 LAB — BASIC METABOLIC PANEL
Calcium: 8.1 mg/dL — ABNORMAL LOW (ref 8.4–10.5)
Creatinine, Ser: 0.68 mg/dL (ref 0.50–1.10)
GFR calc non Af Amer: 82 mL/min — ABNORMAL LOW (ref >90)
Sodium: 142 mEq/L (ref 135–145)

## 2012-05-18 LAB — CBC
MCH: 28.5 pg (ref 26.0–34.0)
MCHC: 33.3 g/dL (ref 30.0–36.0)
MCV: 85.6 fL (ref 78.0–100.0)
Platelets: 361 10*3/uL (ref 150–400)

## 2012-05-18 LAB — INFLUENZA PANEL BY PCR (TYPE A & B)
H1N1 flu by pcr: NOT DETECTED
Influenza B By PCR: NEGATIVE

## 2012-05-18 LAB — MRSA PCR SCREENING: MRSA by PCR: NEGATIVE

## 2012-05-18 LAB — TROPONIN I
Troponin I: <0.3 ng/mL (ref <0.30)
Troponin I: <0.3 ng/mL (ref <0.30)

## 2012-05-18 MED ORDER — MAGNESIUM SULFATE 50 % IJ SOLN
2.0000 g | Freq: Once | INTRAMUSCULAR | Status: DC
  Filled 2012-05-18: qty 4

## 2012-05-18 MED ORDER — DILTIAZEM HCL 100 MG IV SOLR
5.0000 mg/h | INTRAVENOUS | Status: AC
  Administered 2012-05-19 – 2012-05-20 (×4): 10 mg/h via INTRAVENOUS
  Filled 2012-05-18 (×4): qty 100

## 2012-05-18 MED ORDER — POTASSIUM CHLORIDE CRYS ER 20 MEQ PO TBCR
20.0000 meq | EXTENDED_RELEASE_TABLET | ORAL | Status: AC
  Administered 2012-05-18 (×2): 20 meq via ORAL
  Filled 2012-05-18 (×2): qty 1

## 2012-05-18 MED ORDER — ACETAMINOPHEN 325 MG PO TABS
650.0000 mg | ORAL_TABLET | ORAL | Status: DC | PRN
  Administered 2012-05-18 – 2012-05-21 (×2): 650 mg via ORAL
  Filled 2012-05-18 (×2): qty 2

## 2012-05-18 MED ORDER — MAGNESIUM SULFATE 40 MG/ML IJ SOLN: 2.0000 g | Freq: Once | INTRAMUSCULAR | Status: DC

## 2012-05-18 MED ORDER — DILTIAZEM HCL 60 MG PO TABS
60.0000 mg | ORAL_TABLET | Freq: Three times a day (TID) | ORAL | Status: DC
  Filled 2012-05-18 (×3): qty 1

## 2012-05-18 MED ORDER — VANCOMYCIN HCL 500 MG IV SOLR
500.0000 mg | Freq: Two times a day (BID) | INTRAVENOUS | Status: DC
  Administered 2012-05-18 – 2012-05-19 (×3): 500 mg via INTRAVENOUS
  Filled 2012-05-18 (×4): qty 500

## 2012-05-18 NOTE — Progress Notes (Signed)
[facility] ADULT ICU REPLACEMENT PROTOCOL FOR AM LAB REPLACEMENT ONLY  The patient does apply for the [facility] Adult ICU Electrolyte Replacment Protocol based on the criteria listed below:   1. Is GFR >/= 50 ml/min? yes  Patient's GFR today is 82 2. Is urine output >/= 0.5 ml/kg/hr for the last 8 hours? yes Patient's UOP is 4.0 ml/kg/hr 3. Is BUN < 30 mg/dL? yes  Patient's BUN today is 9 4. Abnormal electrolyte(s):K3.3 5. Ordered repletion with: 68meq/KCL   Melrose Nakayama 05/18/2012 5:35 AM

## 2012-05-18 NOTE — Progress Notes (Signed)
PULMONARY  / CRITICAL CARE MEDICINE  Name: [name] MRN: [id number] DOB: [birth year]    LOS: 1  REFERRING MD:  EDP  CHIEF COMPLAINT:  Acute dyspnea  BRIEF PATIENT DESCRIPTION: 78 yo with history of atrial fibrillation who presented to [facility] ED om 12/16 with dyspnea after being seen by her PCP.  Seen one week ago with influenza symptoms and started on Tamiflu.  Since then had increased respiratory symptoms, weakness and decreased fluid intake as well as fevers. Denies chest pain or pedal edema. In ED noted to be in atrial fibrillation with RVR.  LINES / TUBES: 12/16  Foley  CULTURES: 12/16  Urine >>> 12/16  Blood >>> 12/16 flul>>>neg  ANTIBIOTICS: Aztreonam (pneumonia, PCN allergy) >>> Levaquin (pneumonia, atypical) >>>  Vancomycin (pneumonia, community MRSA) >>>  SIGNIFICANT EVENTS:  12/16  Admitted with pneumonia / pulmonary edema, afib/RVR, impending respiratory failure 12/17- improved resp status, fib rvr  LEVEL OF CARE:  ICU  PRIMARY SERVICE:  PCCN  CONSULTANTS:    CODE STATUS:  Full  DIET:  NPO  DVT Px:  Heparin  GI Px:  Not indicated   INTAKE / OUTPUT: Intake/Output      12/16 0701 - 12/17 0700 12/17 0701 - 12/18 0700   P.O. 228    I.V. (mL/kg) 120.4 (1.8) 20 (0.3)   IV Piggyback 250    Total Intake(mL/kg) 598.4 (9.1) 20 (0.3)   Urine (mL/kg/hr) 2235 (1.4) 45 (0.2)   Total Output 2235 45   Net -1636.6 -25         PHYSICAL EXAMINATION: Gen: no increased wob today Neuro:  Awake, alert HEENT:  PERRL 3mm Cardiovascular:  Irregular, rappid Lungs:  Bilateral wheezing, mild, diffuse Abdomen:  Soft, nontender, bowel sounds present Musculoskeletal:  Moves all extremities, no edema Skin:  Intact  LABS:  Lab 05/18/12 0425 05/17/12 2348 05/17/12 2213 05/17/12 2056 05/17/12 1648 05/17/12 1615  HGB 11.9* -- -- -- -- 13.9  WBC 17.4* -- -- -- -- 19.9*  PLT 361 -- -- -- -- 377  NA 142 -- -- -- -- 137  K 3.3* -- -- -- -- 3.0*  CL 103 -- -- -- -- 97   CO2 27 -- -- -- -- 26  GLUCOSE 130* -- -- -- -- 143*  BUN 9 -- -- -- -- 11  CREATININE 0.68 -- -- -- -- 0.60  CALCIUM 8.1* -- -- -- -- 8.8  MG 1.6 -- -- -- -- --  PHOS 2.4 -- -- -- -- --  AST -- -- -- -- -- --  ALT -- -- -- -- -- --  ALKPHOS -- -- -- -- -- --  BILITOT -- -- -- -- -- --  PROT -- -- -- -- -- --  ALBUMIN -- -- -- -- -- --  APTT -- -- -- -- -- --  INR -- -- -- -- -- --  LATICACIDVEN -- -- -- -- 1.61 --  TROPONINI <0.30 -- <0.30 -- -- --  PROCALCITON 0.34 -- -- 0.37 -- --  PROBNP -- -- -- -- -- 1688.0*  O2SATVEN -- -- -- -- -- --  PHART -- 7.539* -- -- -- --  PCO2ART -- 31.5* -- -- -- --  PO2ART -- 66.0* -- -- -- --    Lab 05/18/12 0018  GLUCAP 133*    IMAGING: 12/16  Alveolar / interstitial airspace disease, edema vs infectious cannot r/o, unchanged  ECG:  12/16 >>> Afib/RVR, nonspecific ST-T changes  DIAGNOSES: Active Problems:  Atrial fibrillation with rapid ventricular response  Pulmonary edema  Congestive heart failure  CAP (community acquired pneumonia)  Respiratory failure with hypoxia  Hypokalemia  ASSESSMENT / PLAN:  PULMONARY  A:  CAP.  Acute pulmonary edema. P:   Gaol SpO2>92 Supplemental oxygen Albuterol / Atrovent, may need xopenex Neg balance goal important given clinical status, edema likely pcxr reviewed Control rate abg x 1  CARDIOVASCULAR  A: Atrial fibrillation with RVR.  Suspected acute diastolic CHF.  CAD. R/o ACS.   P:  Goal MAP>60 Trend troponin 2D echo done, awaited Cardizem gttm roaly load cardizem, titrating drip to HR 65-105 Continue ASA, Plavix, Pravachol Correct lytes tsh  RENAL  A:  Normal renal function.  Hypokalemia. hypomag P:   Trend BMP in am  Mg replace Replace K  Lasix 40 bid kvo  GASTROINTESTINAL  A:  No active issues. P:   No  Distress ad diet  HEMATOLOGIC  A:  Leukocytosis, dvt risk P:  Trend CBC again in am Sub q heparin Known fib , no coumadin, fall risk? Hold off  systemic  INFECTIOUS  A:  CAP.  Treated empirically for influenza x 1 week. P:   continua empiric coverage Rapid flu neg  ENDOCRINE   A:  No active issues. P:   tsh with fib rvr  NEUROLOGIC  A:  No active issues. P:   No interventions required  CLINICAL SUMMARY: 78 yo with history of atrial fibrillation who presented to [facility] ED om 12/16 with dyspnea after being seen by her PCP.  Seen one week prior with influenza symptoms and started on Tamiflu. Failure a component, echo pending, improved, treating afib rvr, lasix Keep in icu  Ccm time 30 min     Nelda Bucks., MD  Pulmonary and Critical Care Medicine [facility] Pager: [phone]  05/18/2012, 11:24 AM

## 2012-05-18 NOTE — Progress Notes (Signed)
05/18/12 0300  Clinical Encounter Type  Visited With Patient  Visit Type Initial  Advance Directives (For Healthcare)  Advance Directive Patient does not have advance directive;Patient would like information  Patient requests advance directive information Advance directive packet given  Pre-existing out of facility DNR order (yellow form or pink MOST form) No   Visited with the patient and prayed for her.  [name]

## 2012-05-18 NOTE — ED Provider Notes (Signed)
This was a shared patient encounter.  With our mid-level provider, I discussed, managed to the patient.  He was clear from arrival, with her tachycardia, tachypnea, poor breath sounds, that she was acutely ill.  Given these initial vital signs are suspicion of both infection, as well as congestive heart failure, though the initial heart rhythm of atrial fibrillation was also noted.  Initial interventions included diltiazem, fluids.  With marginal improvement in her vital signs, but with more pronounced improvement clinically, we discussed the patient's care with critical care.  Given the initial dose of the patient was also treated empirically for sepsis.  The most reassuring for an initial evaluation with the patient's preserved mentation.  Following minimal improvement with diltiazem, digoxin, Lasix were provided.  Given the persistency of her symptoms, ongoing vital sign abnormalities, she required ICU admission for further evaluation and management.  I saw the ECG, relevant labs and studies - I agree with the interpretation.  CRITICAL CARE Performed by: Gerhard Munch   Total critical care time: 35  Critical care time was exclusive of separately billable procedures and treating other patients.  Critical care was necessary to treat or prevent imminent or life-threatening deterioration.  Critical care was time spent personally by me on the following activities: development of treatment plan with patient and/or surrogate as well as nursing, discussions with consultants, evaluation of patient's response to treatment, examination of patient, obtaining history from patient or surrogate, ordering and performing treatments and interventions, ordering and review of laboratory studies, ordering and review of radiographic studies, pulse oximetry and re-evaluation of patient's condition.   Gerhard Munch, MD 05/18/12 [phone]

## 2012-05-18 NOTE — Progress Notes (Signed)
Clinical Social Work Department BRIEF PSYCHOSOCIAL ASSESSMENT 05/18/2012  Patient:  Neidhardt,Adlynn A     Account Number:  [id number]     Admit date:  05/17/2012  Clinical Social Worker:  Dennison Bulla  Date/Time:  05/18/2012 11:15 AM  Referred by:  Physician  Date Referred:  05/18/2012 Referred for  SNF Placement   Other Referral:   Interview type:  Patient Other interview type:    PSYCHOSOCIAL DATA Living Status:  ALONE Admitted from facility:   Level of care:   Primary support name:  Fayrene Fearing Primary support relationship to patient:  SPOUSE Degree of support available:   Lacking at this time    CURRENT CONCERNS Current Concerns  Post-Acute Placement   Other Concerns:    SOCIAL WORK ASSESSMENT / PLAN CSW received referral to complete psychosocial assessment. CSW reviewed chart and met with patient at bedside. No visitors present.    CSW introduced myself and explained role. Patient reports that she came to the hospital because she was feeling weak. Patient is married but husband is at [employer] for the past month due to having a stroke. Patient reports that she has a son that lives in [place] and a sister that lives in [place] who is coming to visit. Patient reports that she was independent PTA and does not want placement. CSW and patient discussed options. Patient reports that "you pick up all those germs" at  SNF and prefers to return home. Patient has had HH in the past and is agreeable if needed.    CSW agreed to continue to follow patient and to assist with needs once disposition is determined.   Assessment/plan status:  Psychosocial Support/Ongoing Assessment of Needs Other assessment/ plan:   Information/referral to community resources:   [facility] vs SNF    PATIENT'S/FAMILY'S RESPONSE TO PLAN OF CARE: Patient alert and oriented. Patient engaged during assessment and receptive to CSW. Patient agreeable to CSW follow up throughout stay.

## 2012-05-18 NOTE — Progress Notes (Signed)
  Echocardiogram 2D Echocardiogram has been performed.  [name] 05/18/2012, 10:05 AM

## 2012-05-18 NOTE — Progress Notes (Signed)
ANTIBIOTIC CONSULT NOTE - FOLLOW UP  Pharmacy Consult for vancomycin Indication: pneumonia  Allergies  Allergen Reactions  . Amoxicillin     Can't remember reaction  . Doxycycline     Can't remember reaction  . Penicillins     Can't remember reaction  . Sulfa Antibiotics     Can't remember reaction    Patient Measurements: Height: 5' (152.4 cm) Weight: 144 lb 13.5 oz (65.7 kg) IBW/kg (Calculated) : 45.5   Vital Signs: Temp: 99.1 F (37.3 C) (12/17 0810) Temp src: Oral (12/17 0810) BP: 111/61 mmHg (12/17 1240) Pulse Rate: 106  (12/17 1240) Intake/Output from previous day: 12/16 0701 - 12/17 0700 In: 598.4 [P.O.:228; I.V.:120.4; IV Piggyback:250] Out: 2235 [Urine:2235] Intake/Output from this shift: Total I/O In: 20 [I.V.:20] Out: 45 [Urine:45]  Labs:  [facility] 05/18/12 0425 05/17/12 1615  WBC 17.4* 19.9*  HGB 11.9* 13.9  PLT 361 377  LABCREA -- --  CREATININE 0.68 0.60   Estimated Creatinine Clearance: 49 ml/min (by C-G formula based on Cr of 0.68).   Assessment: 78 yo female with CAP  On day 2 antibiotics. Cultures are pending, WBC= 17.4 and tmax= 100.  12/16 vanco> 12/16 levaquin> 12/16 azactam>  12/16 blood - ngtd 12/16 urine- ngtd  Goal of Therapy:  Vancomycin trough level 15-20 mcg/ml  Plan:  -Will change vancomycin to 500mg  Iv q12h -Will follow cultures, renal function and clinical progress  Harland German, Pharm D 05/18/2012 12:46 PM

## 2012-05-19 ENCOUNTER — Inpatient Hospital Stay (HOSPITAL_COMMUNITY): Payer: Medicare Other

## 2012-05-19 ENCOUNTER — Encounter (HOSPITAL_COMMUNITY): Payer: Self-pay | Admitting: Cardiology

## 2012-05-19 LAB — URINE CULTURE
Colony Count: NO GROWTH
Culture: NO GROWTH

## 2012-05-19 LAB — COMPREHENSIVE METABOLIC PANEL
Albumin: 2.4 g/dL — ABNORMAL LOW (ref 3.5–5.2)
Alkaline Phosphatase: 100 U/L (ref 39–117)
BUN: 13 mg/dL (ref 6–23)
Potassium: 3.1 mEq/L — ABNORMAL LOW (ref 3.5–5.1)
Total Protein: 6.5 g/dL (ref 6.0–8.3)

## 2012-05-19 LAB — LEGIONELLA ANTIGEN, URINE: Legionella Antigen, Urine: NEGATIVE

## 2012-05-19 LAB — CBC WITH DIFFERENTIAL/PLATELET
Basophils Relative: 0 % (ref 0–1)
Eosinophils Absolute: 0.1 10*3/uL (ref 0.0–0.7)
Hemoglobin: 12.1 g/dL (ref 12.0–15.0)
MCH: 28.3 pg (ref 26.0–34.0)
MCHC: 32.9 g/dL (ref 30.0–36.0)
Monocytes Relative: 10 % (ref 3–12)
Neutrophils Relative %: 75 % (ref 43–77)
Platelets: 366 10*3/uL (ref 150–400)

## 2012-05-19 LAB — STREP PNEUMONIAE URINARY ANTIGEN: Strep Pneumo Urinary Antigen: NEGATIVE

## 2012-05-19 MED ORDER — AMIODARONE LOAD VIA INFUSION
150.0000 mg | Freq: Once | INTRAVENOUS | Status: AC
  Administered 2012-05-19: 150 mg via INTRAVENOUS
  Filled 2012-05-19: qty 83.34

## 2012-05-19 MED ORDER — SODIUM CHLORIDE 0.9 % IV SOLN
INTRAVENOUS | Status: DC | PRN
  Administered 2012-05-21: 12:00:00 via INTRAVENOUS

## 2012-05-19 MED ORDER — SODIUM CHLORIDE 0.9 % IV SOLN
INTRAVENOUS | Status: DC | PRN
  Administered 2012-05-19: 17:00:00 via INTRAVENOUS
  Administered 2012-05-21: 20 mL/h via INTRAVENOUS

## 2012-05-19 MED ORDER — AMIODARONE HCL IN DEXTROSE 360-4.14 MG/200ML-% IV SOLN
0.5000 mg/min | INTRAVENOUS | Status: DC
  Administered 2012-05-19 – 2012-05-22 (×5): 0.5 mg/min via INTRAVENOUS
  Filled 2012-05-19 (×11): qty 200

## 2012-05-19 MED ORDER — HEPARIN (PORCINE) IN NACL 100-0.45 UNIT/ML-% IJ SOLN
1000.0000 [IU]/h | INTRAMUSCULAR | Status: DC
  Administered 2012-05-19: 800 [IU]/h via INTRAVENOUS
  Filled 2012-05-19 (×2): qty 250

## 2012-05-19 MED ORDER — POTASSIUM CHLORIDE CRYS ER 10 MEQ PO TBCR
30.0000 meq | EXTENDED_RELEASE_TABLET | ORAL | Status: AC
  Administered 2012-05-19 (×2): 30 meq via ORAL
  Filled 2012-05-19: qty 2
  Filled 2012-05-19: qty 1

## 2012-05-19 MED ORDER — AMIODARONE HCL IN DEXTROSE 360-4.14 MG/200ML-% IV SOLN
1.0000 mg/min | INTRAVENOUS | Status: AC
  Administered 2012-05-19 (×2): 1 mg/min via INTRAVENOUS
  Filled 2012-05-19 (×2): qty 200

## 2012-05-19 MED ORDER — FUROSEMIDE 10 MG/ML IJ SOLN
60.0000 mg | Freq: Two times a day (BID) | INTRAMUSCULAR | Status: AC
  Administered 2012-05-19 – 2012-05-21 (×4): 60 mg via INTRAVENOUS
  Filled 2012-05-19 (×4): qty 6

## 2012-05-19 NOTE — Progress Notes (Signed)
PULMONARY  / CRITICAL CARE MEDICINE  Name: [name] MRN: [id number] DOB: [birth year]    LOS: 2  REFERRING MD:  EDP  CHIEF COMPLAINT:  Acute dyspnea  BRIEF PATIENT DESCRIPTION: Regina yo with history of atrial fibrillation who presented to [facility] ED om 12/16 with dyspnea after being seen by her PCP.  Seen one week ago with influenza symptoms and started on Tamiflu.  Since then had increased respiratory symptoms, weakness and decreased fluid intake as well as fevers. Denies chest pain or pedal edema. In ED noted to be in atrial fibrillation with RVR.  LINES / TUBES: 12/16  Foley  CULTURES: 12/16  Urine >>> 12/16  Blood >>> 12/16 flul>>>neg  ANTIBIOTICS: Aztreonam (pneumonia, PCN allergy) >>> Levaquin (pneumonia, atypical) >>>  Vancomycin (pneumonia, community MRSA) >>>  SIGNIFICANT EVENTS:  12/16  Admitted with pneumonia / pulmonary edema, afib/RVR, impending respiratory failure 12/17- improved resp status, fib rvr 12/18- rising cardizem needs, tachy  LEVEL OF CARE:  ICU  PRIMARY SERVICE:  PCCN  CONSULTANTS:    CODE STATUS:  Full  DIET:  NPO  DVT Px:  Heparin  GI Px:  Not indicated   INTAKE / OUTPUT: Intake/Output      12/17 0701 - 12/18 0700 12/18 0701 - 12/19 0700   P.O. 600 180   I.V. (mL/kg) 136.5 (2.1) 120 (1.8)   IV Piggyback 512    Total Intake(mL/kg) 1248.5 (18.9) 300 (4.5)   Urine (mL/kg/hr) 1270 (0.8) 175 (0.4)   Total Output 1270 175   Net -21.5 +125         PHYSICAL EXAMINATION: Gen: no increased wob, appears good Neuro:  Awake, alert HEENT:  PERRL 3mm Cardiovascular: S1 S2 rr Irregular, rapid again Lungs:  wheezing mild diffuse Abdomen:  Soft, nontender, bowel sounds present Musculoskeletal:  Moves all extremities, no edema Skin:  Intact  LABS:  Lab 05/19/12 0415 05/18/12 1046 05/18/12 0425 05/17/12 2348 05/17/12 2213 05/17/12 2056 05/17/12 1648 05/17/12 1615  HGB 12.1 -- 11.9* -- -- -- -- 13.9  WBC 17.1* -- 17.4* -- -- -- -- 19.9*  PLT  366 -- 361 -- -- -- -- 377  NA 139 -- 142 -- -- -- -- 137  K 3.1* -- 3.3* -- -- -- -- --  CL 99 -- 103 -- -- -- -- 97  CO2 26 -- 27 -- -- -- -- 26  GLUCOSE 138* -- 130* -- -- -- -- 143*  BUN 13 -- 9 -- -- -- -- 11  CREATININE 0.67 -- 0.68 -- -- -- -- 0.60  CALCIUM 8.7 -- 8.1* -- -- -- -- 8.8  MG -- -- 1.6 -- -- -- -- --  PHOS -- -- 2.4 -- -- -- -- --  AST 111* -- -- -- -- -- -- --  ALT 98* -- -- -- -- -- -- --  ALKPHOS 100 -- -- -- -- -- -- --  BILITOT 0.3 -- -- -- -- -- -- --  PROT 6.5 -- -- -- -- -- -- --  ALBUMIN 2.4* -- -- -- -- -- -- --  APTT -- -- -- -- -- -- -- --  INR -- -- -- -- -- -- -- --  LATICACIDVEN -- -- -- -- -- -- 1.61 --  TROPONINI -- <0.30 <0.30 -- <0.30 -- -- --  PROCALCITON 0.67 -- 0.34 -- -- 0.37 -- --  PROBNP -- -- -- -- -- -- -- 1688.0*  O2SATVEN -- -- -- -- -- -- -- --  PHART -- -- -- 7.539* -- -- -- --  PCO2ART -- -- -- 31.5* -- -- -- --  PO2ART -- -- -- 66.0* -- -- -- --    Lab 05/18/12 0018  GLUCAP 133*    IMAGING: 12/17  Alveolar / interstitial airspace disease, improved aeration slight  ECG:  12/16 >>> Afib/RVR, nonspecific ST-T changes  DIAGNOSES: Active Problems:  Atrial fibrillation with rapid ventricular response  Pulmonary edema  Congestive heart failure  CAP (community acquired pneumonia)  Respiratory failure with hypoxia  Hypokalemia  ASSESSMENT / PLAN:  PULMONARY  A:  CAP.  Acute pulmonary edema. P:   Gaol SpO2>92 Control arte further Even to neg balance goal pcxr follow up, some chronic  CARDIOVASCULAR  A: Atrial fibrillation with RVR.  Suspected acute diastolic CHF.  CAD. No evidence ACS P:  Correct k Goal MAP>60 2D echo - 55%, severe dil LA, Pa 40 Cardizem gttm increased, in spite of oral load Continue ASA, Plavix, Pravachol tsh 0.667 Consider amio load , short term only use Cards call, has seen [facility] per pt Consider heparin, she was not on coum as outpt, fall risk??, will add heparin for now    RENAL  A:  Normal renal function.  Hypokalemia. hypomag P:   Trend BMP in am  Replace K  Lasix 40 bid, maintain kvo  GASTROINTESTINAL  A:  No active issues. P:   Diet tolerated ppi  HEMATOLOGIC  A:  Leukocytosis, dvt risk P:  Trend CBC again in am Hep start  pharmacy  INFECTIOUS  A:  CAP. r/o P:   Consider empiric levo Dc vanc  ENDOCRINE   A:  No active issues. P:   tsh wnl  NEUROLOGIC  A:  No active issues. P:   No interventions required  CLINICAL SUMMARY: Regina yo with history of atrial fibrillation who presented to [facility] ED om 12/16 with dyspnea after being seen by her PCP.  Seen one week prior with influenza symptoms and started on Tamiflu. rvr an issue , add amio to cardizem, add hep for now,. Cards eval, continue lasix, correct K  Keep in icu  Ccm time 30 min   Nelda Bucks., MD  Pulmonary and Critical Care Medicine [facility] Pager: [phone]  05/19/2012, 2:25 PM

## 2012-05-19 NOTE — Progress Notes (Signed)
ANTICOAGULATION CONSULT NOTE - Initial Consult  Pharmacy Consult for heparin Indication: atrial fibrillation  Allergies  Allergen Reactions  . Amoxicillin     Can't remember reaction  . Doxycycline     Can't remember reaction  . Penicillins     Can't remember reaction  . Sulfa Antibiotics     Can't remember reaction    Patient Measurements: Height: 5' (152.4 cm) Weight: 145 lb 8.1 oz (66 kg) IBW/kg (Calculated) : 45.5   Vital Signs: Temp: 99 F (37.2 C) (12/18 1355) Temp src: Oral (12/18 1355) BP: 109/69 mmHg (12/18 1330) Pulse Rate: 106  (12/18 1330)  Labs:  [facility] 05/19/12 0415 05/18/12 1046 05/18/12 0425 05/17/12 2213 05/17/12 1615  HGB 12.1 -- 11.9* -- --  HCT 36.8 -- 35.7* -- 41.0  PLT 366 -- 361 -- 377  APTT -- -- -- -- --  LABPROT -- -- -- -- --  INR -- -- -- -- --  HEPARINUNFRC -- -- -- -- --  CREATININE 0.67 -- 0.68 -- 0.60  CKTOTAL -- -- -- -- --  CKMB -- -- -- -- --  TROPONINI -- <0.30 <0.30 <0.30 --    Estimated Creatinine Clearance: 49.1 ml/min (by C-G formula based on Cr of 0.67).   Medical History: Past Medical History  Diagnosis Date  . Irregular heart beat   . Hypertension   . Hypercholesteremia   . Depression   . Atrial fibrillation     Medications:  Prescriptions prior to admission  Medication Sig Dispense Refill  . acetaminophen (TYLENOL) 500 MG tablet Take 500 mg by mouth at bedtime as needed. For pain to help sleep      . aspirin EC 81 MG tablet Take 81 mg by mouth daily.      . clopidogrel (PLAVIX) 75 MG tablet Take 75 mg by mouth daily.      Marland Kitchen diltiazem (CARDIZEM CD) 180 MG 24 hr capsule Take 180 mg by mouth daily.      Marland Kitchen lisinopril (PRINIVIL,ZESTRIL) 10 MG tablet Take 10 mg by mouth daily.      . pravastatin (PRAVACHOL) 40 MG tablet Take 40 mg by mouth daily.        Assessment: 78 yo female with afib to start heparin. Patient noted on heparin sq (last dose was 5000 units at 2:40pm)  Goal of Therapy:  Heparin level  0.3-0.7 units/ml Monitor platelets by anticoagulation protocol: Yes   Plan:  -No heparin bolus due to recent sq dose -Heparin infusion at 800 units/hr -Heparin level in 8 hours and daily wth CBC daily  Harland German, Pharm D 05/19/2012 3:03 PM

## 2012-05-19 NOTE — Consult Note (Signed)
Reason for Consult: PAF    Cardiologist: Dr. Alanda Amass  Referring Physician: Dr. [name] is an 78 y.o. female.    Chief Complaint: admitted 05/17/12 with PNA type complaints and pul edema and afib with RVR.   HPI: 78 yo with history of P. atrial fibrillation who presented to [facility] ED on 12/16 with dyspnea after being seen by her PCP. Seen one week ago with influenza symptoms and started on Tamiflu. Since then had increased respiratory symptoms, weakness and decreased fluid intake as well as fevers. Denies chest pain or pedal edema. In ED noted to be in atrial fibrillation with RVR.  Pt has been on Cardizem drip but rate continues to be elevated.  Dr. Tyson Alias is planning on using IV amiodarone just to control the rate, but does not want to use it long term.  Heparin is being added. Troponin I has been negative and she denies chest pain, now or before acute illness.   Pt has hx. Of a. fib in 2011 prior to surgery, she was treated with rate control meds and coumadin and cardioverted 2011.  She had maintained SR since DCCV until now and was off coumadin since 08/2010 but on asa and Plavix, prior to this episode.  No history of Cardiac Cath, but negative myoview for ischemia in 10/2009.  Last Echo was 3/13 and normal LV function with diastolic relaxation abnormality and no significant valve disease.   ECHO now: Left ventricle: The cavity size was normal. There was mild concentric hypertrophy. Systolic function was vigorous. The estimated ejection fraction was in the range of 65% to 70%. Wall motion was normal; there were no regional wall motion abnormalities. Atrial fibrillation limitsevaluation of LV diastolic function. E/e' ratio is equivocal - indeterminate LA pressure. - Mitral valve: Mildly calcified annulus. - Left atrium: The atrium was moderately to severely dilated. - Atrial septum: No defect or patent foramen ovale was identified. - Pulmonary arteries: Systolic  pressure was mildly increased. PA peak pressure: 40mm Hg (S). - Pericardium, extracardiac: A trivial pericardial effusion was identified  Past Medical History  Diagnosis Date  . Irregular heart beat   . Hypertension   . Hypercholesteremia   . Depression   . Atrial fibrillation     History reviewed. No pertinent past surgical history.  History reviewed. No pertinent family history. Social History:  reports that she has never smoked. She does not have any smokeless tobacco history on file. She reports that she does not drink alcohol or use illicit drugs. married, 1 child.  Her husband has recently had a stroke and is SNF for rehab.     Allergies:  Allergies  Allergen Reactions  . Amoxicillin     Can't remember reaction  . Doxycycline     Can't remember reaction  . Penicillins     Can't remember reaction  . Sulfa Antibiotics     Can't remember reaction    Medications Prior to Admission  Medication Sig Dispense Refill  . acetaminophen (TYLENOL) 500 MG tablet Take 500 mg by mouth at bedtime as needed. For pain to help sleep      . aspirin EC 81 MG tablet Take 81 mg by mouth daily.      . clopidogrel (PLAVIX) 75 MG tablet Take 75 mg by mouth daily.      Marland Kitchen diltiazem (CARDIZEM CD) 180 MG 24 hr capsule Take 180 mg by mouth daily.      Marland Kitchen lisinopril (PRINIVIL,ZESTRIL) 10 MG tablet Take  10 mg by mouth daily.      . pravastatin (PRAVACHOL) 40 MG tablet Take 40 mg by mouth daily.        Results for orders placed during the hospital encounter of 05/17/12 (from the past 48 hour(s))  CBC     Status: Abnormal   Collection Time   05/17/12  4:15 PM      Component Value Range Comment   WBC 19.9 (*) 4.0 - 10.5 K/uL    RBC 4.76  3.87 - 5.11 MIL/uL    Hemoglobin 13.9  12.0 - 15.0 g/dL    HCT 40.9  81.1 - 91.4 %    MCV 86.1  78.0 - 100.0 fL    MCH 29.2  26.0 - 34.0 pg    MCHC 33.9  30.0 - 36.0 g/dL    RDW 78.2  95.6 - 21.3 %    Platelets 377  150 - 400 K/uL   BASIC METABOLIC PANEL      Status: Abnormal   Collection Time   05/17/12  4:15 PM      Component Value Range Comment   Sodium 137  135 - 145 mEq/L    Potassium 3.0 (*) 3.5 - 5.1 mEq/L    Chloride 97  96 - 112 mEq/L    CO2 26  19 - 32 mEq/L    Glucose, Bld 143 (*) 70 - 99 mg/dL    BUN 11  6 - 23 mg/dL    Creatinine, Ser 0.86  0.50 - 1.10 mg/dL    Calcium 8.8  8.4 - 57.8 mg/dL    GFR calc non Af Amer 85 (*) >90 mL/min    GFR calc Af Amer >90  >90 mL/min   PRO B NATRIURETIC PEPTIDE     Status: Abnormal   Collection Time   05/17/12  4:15 PM      Component Value Range Comment   Pro B Natriuretic peptide (BNP) 1688.0 (*) 0 - 450 pg/mL   POCT I-STAT TROPONIN I     Status: Normal   Collection Time   05/17/12  4:46 PM      Component Value Range Comment   Troponin i, poc 0.06  0.00 - 0.08 ng/mL    Comment 3            CG4 I-STAT (LACTIC ACID)     Status: Normal   Collection Time   05/17/12  4:48 PM      Component Value Range Comment   Lactic Acid, Venous 1.61  0.5 - 2.2 mmol/L   CULTURE, BLOOD (ROUTINE X 2)     Status: Normal (Preliminary result)   Collection Time   05/17/12  5:15 PM      Component Value Range Comment   Specimen Description BLOOD ARM LEFT      Special Requests BOTTLES DRAWN AEROBIC AND ANAEROBIC 10CC      Culture  Setup Time 05/17/2012 23:23      Culture        Value:        BLOOD CULTURE RECEIVED NO GROWTH TO DATE CULTURE WILL BE HELD FOR 5 DAYS BEFORE ISSUING A FINAL NEGATIVE REPORT   Report Status PENDING     CULTURE, BLOOD (ROUTINE X 2)     Status: Normal (Preliminary result)   Collection Time   05/17/12  5:20 PM      Component Value Range Comment   Specimen Description BLOOD HAND LEFT      Special Requests BOTTLES DRAWN  AEROBIC ONLY 6CC      Culture  Setup Time 05/17/2012 23:24      Culture        Value:        BLOOD CULTURE RECEIVED NO GROWTH TO DATE CULTURE WILL BE HELD FOR 5 DAYS BEFORE ISSUING A FINAL NEGATIVE REPORT   Report Status PENDING     PROCALCITONIN     Status: Normal    Collection Time   05/17/12  8:56 PM      Component Value Range Comment   Procalcitonin 0.37     URINALYSIS, ROUTINE W REFLEX MICROSCOPIC     Status: Abnormal   Collection Time   05/17/12 10:03 PM      Component Value Range Comment   Color, Urine YELLOW  YELLOW    APPearance CLEAR  CLEAR    Specific Gravity, Urine 1.006  1.005 - 1.030    pH 6.5  5.0 - 8.0    Glucose, UA NEGATIVE  NEGATIVE mg/dL    Hgb urine dipstick SMALL (*) NEGATIVE    Bilirubin Urine NEGATIVE  NEGATIVE    Ketones, ur NEGATIVE  NEGATIVE mg/dL    Protein, ur NEGATIVE  NEGATIVE mg/dL    Urobilinogen, UA 1.0  0.0 - 1.0 mg/dL    Nitrite NEGATIVE  NEGATIVE    Leukocytes, UA SMALL (*) NEGATIVE   URINE CULTURE     Status: Normal   Collection Time   05/17/12 10:03 PM      Component Value Range Comment   Specimen Description URINE, CATHETERIZED      Special Requests NONE      Culture  Setup Time 05/17/2012 22:44      Colony Count NO GROWTH      Culture NO GROWTH      Report Status 05/19/2012 FINAL     URINE MICROSCOPIC-ADD ON     Status: Normal   Collection Time   05/17/12 10:03 PM      Component Value Range Comment   Squamous Epithelial / LPF RARE  RARE    WBC, UA 7-10  <3 WBC/hpf    RBC / HPF 0-2  <3 RBC/hpf    Bacteria, UA RARE  RARE   TROPONIN I     Status: Normal   Collection Time   05/17/12 10:13 PM      Component Value Range Comment   Troponin I <0.30  <0.30 ng/mL   MRSA PCR SCREENING     Status: Normal   Collection Time   05/17/12 11:09 PM      Component Value Range Comment   MRSA by PCR NEGATIVE  NEGATIVE   URINALYSIS, ROUTINE W REFLEX MICROSCOPIC     Status: Abnormal   Collection Time   05/17/12 11:12 PM      Component Value Range Comment   Color, Urine YELLOW  YELLOW    APPearance CLOUDY (*) CLEAR    Specific Gravity, Urine 1.006  1.005 - 1.030    pH 7.5  5.0 - 8.0    Glucose, UA NEGATIVE  NEGATIVE mg/dL    Hgb urine dipstick TRACE (*) NEGATIVE    Bilirubin Urine NEGATIVE  NEGATIVE     Ketones, ur NEGATIVE  NEGATIVE mg/dL    Protein, ur NEGATIVE  NEGATIVE mg/dL    Urobilinogen, UA 1.0  0.0 - 1.0 mg/dL    Nitrite NEGATIVE  NEGATIVE    Leukocytes, UA SMALL (*) NEGATIVE   URINE MICROSCOPIC-ADD ON     Status: Normal  Collection Time   05/17/12 11:12 PM      Component Value Range Comment   Squamous Epithelial / LPF RARE  RARE    WBC, UA 3-6  <3 WBC/hpf    RBC / HPF 3-6  <3 RBC/hpf    Bacteria, UA RARE  RARE   INFLUENZA PANEL BY PCR     Status: Normal   Collection Time   05/17/12 11:16 PM      Component Value Range Comment   Influenza A By PCR NEGATIVE  NEGATIVE    Influenza B By PCR NEGATIVE  NEGATIVE    H1N1 flu by pcr NOT DETECTED  NOT DETECTED   POCT I-STAT 3, BLOOD GAS (G3+)     Status: Abnormal   Collection Time   05/17/12 11:48 PM      Component Value Range Comment   pH, Arterial 7.539 (*) 7.350 - 7.450    pCO2 arterial 31.5 (*) 35.0 - 45.0 mmHg    pO2, Arterial 66.0 (*) 80.0 - 100.0 mmHg    Bicarbonate 26.8 (*) 20.0 - 24.0 mEq/L    TCO2 28  0 - 100 mmol/L    O2 Saturation 95.0      Acid-Base Excess 5.0 (*) 0.0 - 2.0 mmol/L    Patient temperature 98.4 F      Collection site RADIAL, ALLEN'S TEST ACCEPTABLE      Drawn by RT      Sample type ARTERIAL     GLUCOSE, CAPILLARY     Status: Abnormal   Collection Time   05/18/12 12:18 AM      Component Value Range Comment   Glucose-Capillary 133 (*) 70 - 99 mg/dL   CBC     Status: Abnormal   Collection Time   05/18/12  4:25 AM      Component Value Range Comment   WBC 17.4 (*) 4.0 - 10.5 K/uL    RBC 4.17  3.87 - 5.11 MIL/uL    Hemoglobin 11.9 (*) 12.0 - 15.0 g/dL    HCT 47.8 (*) 29.5 - 46.0 %    MCV 85.6  78.0 - 100.0 fL    MCH 28.5  26.0 - 34.0 pg    MCHC 33.3  30.0 - 36.0 g/dL    RDW 62.1  30.8 - 65.7 %    Platelets 361  150 - 400 K/uL   BASIC METABOLIC PANEL     Status: Abnormal   Collection Time   05/18/12  4:25 AM      Component Value Range Comment   Sodium 142  135 - 145 mEq/L    Potassium 3.3  (*) 3.5 - 5.1 mEq/L    Chloride 103  96 - 112 mEq/L    CO2 27  19 - 32 mEq/L    Glucose, Bld 130 (*) 70 - 99 mg/dL    BUN 9  6 - 23 mg/dL    Creatinine, Ser 8.46  0.50 - 1.10 mg/dL    Calcium 8.1 (*) 8.4 - 10.5 mg/dL    GFR calc non Af Amer 82 (*) >90 mL/min    GFR calc Af Amer >90  >90 mL/min   PHOSPHORUS     Status: Normal   Collection Time   05/18/12  4:25 AM      Component Value Range Comment   Phosphorus 2.4  2.3 - 4.6 mg/dL   MAGNESIUM     Status: Normal   Collection Time   05/18/12  4:25 AM  Component Value Range Comment   Magnesium 1.6  1.5 - 2.5 mg/dL   TROPONIN I     Status: Normal   Collection Time   05/18/12  4:25 AM      Component Value Range Comment   Troponin I <0.30  <0.30 ng/mL   PROCALCITONIN     Status: Normal   Collection Time   05/18/12  4:25 AM      Component Value Range Comment   Procalcitonin 0.34     TROPONIN I     Status: Normal   Collection Time   05/18/12 10:46 AM      Component Value Range Comment   Troponin I <0.30  <0.30 ng/mL   TSH     Status: Normal   Collection Time   05/18/12 10:46 AM      Component Value Range Comment   TSH 0.661  0.350 - 4.500 uIU/mL   LEGIONELLA ANTIGEN, URINE     Status: Normal   Collection Time   05/18/12  8:10 PM      Component Value Range Comment   Specimen Description URINE, CATHETERIZED      Special Requests NONE      Legionella Antigen, Urine Negative for Legionella pneumophilia serogroup 1      Report Status 05/19/2012 FINAL     STREP PNEUMONIAE URINARY ANTIGEN     Status: Normal   Collection Time   05/18/12  8:10 PM      Component Value Range Comment   Strep Pneumo Urinary Antigen NEGATIVE  NEGATIVE   CBC WITH DIFFERENTIAL     Status: Abnormal   Collection Time   05/19/12  4:15 AM      Component Value Range Comment   WBC 17.1 (*) 4.0 - 10.5 K/uL    RBC 4.27  3.87 - 5.11 MIL/uL    Hemoglobin 12.1  12.0 - 15.0 g/dL    HCT 78.2  95.6 - 21.3 %    MCV 86.2  78.0 - 100.0 fL    MCH 28.3  26.0 -  34.0 pg    MCHC 32.9  30.0 - 36.0 g/dL    RDW 08.6  57.8 - 46.9 %    Platelets 366  150 - 400 K/uL    Neutrophils Relative 75  43 - 77 %    Neutro Abs 12.9 (*) 1.7 - 7.7 K/uL    Lymphocytes Relative 14  12 - 46 %    Lymphs Abs 2.5  0.7 - 4.0 K/uL    Monocytes Relative 10  3 - 12 %    Monocytes Absolute 1.7 (*) 0.1 - 1.0 K/uL    Eosinophils Relative 0  0 - 5 %    Eosinophils Absolute 0.1  0.0 - 0.7 K/uL    Basophils Relative 0  0 - 1 %    Basophils Absolute 0.1  0.0 - 0.1 K/uL   COMPREHENSIVE METABOLIC PANEL     Status: Abnormal   Collection Time   05/19/12  4:15 AM      Component Value Range Comment   Sodium 139  135 - 145 mEq/L    Potassium 3.1 (*) 3.5 - 5.1 mEq/L    Chloride 99  96 - 112 mEq/L    CO2 26  19 - 32 mEq/L    Glucose, Bld 138 (*) 70 - 99 mg/dL    BUN 13  6 - 23 mg/dL    Creatinine, Ser 6.29  0.50 - 1.10 mg/dL    Calcium  8.7  8.4 - 10.5 mg/dL    Total Protein 6.5  6.0 - 8.3 g/dL    Albumin 2.4 (*) 3.5 - 5.2 g/dL    AST 119 (*) 0 - 37 U/L    ALT 98 (*) 0 - 35 U/L    Alkaline Phosphatase 100  39 - 117 U/L    Total Bilirubin 0.3  0.3 - 1.2 mg/dL    GFR calc non Af Amer 82 (*) >90 mL/min    GFR calc Af Amer >90  >90 mL/min   PROCALCITONIN     Status: Normal   Collection Time   05/19/12  4:15 AM      Component Value Range Comment   Procalcitonin 0.67      Dg Chest Port 1 View  05/19/2012  *RADIOLOGY REPORT*  Clinical Data: Short of breath, cough, evaluate pulmonary edema  PORTABLE CHEST - 1 VIEW  Comparison: 05/18/2012; 10/11/2009; chest CT - 05/03/2007  Findings: Grossly unchanged enlarged cardiac silhouette and mediastinal contours with atherosclerotic calcifications within a mildly tortuous thoracic aorta.  Pulmonary vasculature remains indistinct.  There is unchanged mild diffuse slightly nodular thickening of the pulmonary interstitium with basilar predominance. No new focal airspace opacities.  No definite pleural effusion or pneumothorax.  Unchanged bones.   IMPRESSION: Grossly unchanged mild diffuse slightly nodular thickening of the pulmonary interstitium with differential considerations including again including but not limited to pulmonary edema and atypical infection.  A follow-up chest radiograph in 4 to 6 weeks after treatment is recommended to ensure resolution.   Original Report Authenticated By: Tacey Ruiz, MD    Dg Chest Port 1 View  05/18/2012  *RADIOLOGY REPORT*  Clinical Data: Evaluate pneumonia, shortness of breath  PORTABLE CHEST - 1 VIEW  Comparison: 05/17/2012; 10/11/2009; 08/13/2007; chest CT - 05/03/2007  Findings: Grossly unchanged enlarged cardiac silhouette and mediastinal contours with atherosclerotic calcifications of the aortic arch.  There is grossly unchanged mild diffuse increased conspicuity of the pulmonary interstitium with slight nodular configuration.  Grossly unchanged bibasilar heterogeneous opacities.  No new focal airspace opacity.  No definite pleural effusion or pneumothorax.  Unchanged bones.  IMPRESSION: Grossly unchanged diffuse interstitial thickening with basilar predominance and slight nodular configuration, while possibly representative of pulmonary edema, infectious etiologies, including atypicals, may have a similar appearance.   A follow-up chest radiograph in 4 to 6 weeks after treatment is recommended to ensure resolution.   Original Report Authenticated By: Tacey Ruiz, MD    Dg Chest Port 1 View  05/17/2012  *RADIOLOGY REPORT*  Clinical Data: Shortness of breath  PORTABLE CHEST - 1 VIEW  Comparison: 02/01/2010  Findings: Increased interstitial markings/diffuse reticular nodular opacity, possibly reflecting atypical infection or mild interstitial edema.  Focal right lower lobe opacity, suspicious for pneumonia. No pleural effusion or pneumothorax.  Cardiomediastinal silhouette is within normal limits.  IMPRESSION: Patchy right lower lobe opacity, suspicious for pneumonia.  Diffuse increased interstitial  markings, possibly reflecting underlying interstitial edema or atypical infection.   Original Report Authenticated By: Charline Bills, M.D.     ROS: General:recent cold and increased fatigue , no weight changes Skin:no rashes or ulcers HEENT:no blurred vision, no congestion CV:see HPI PUL:see HPI GI:no diarrhea constipation or melena, no indigestion GU:no hematuria, no dysuria MS:no joint pain, no claudication Neuro:no syncope, no lightheadedness Endo:no diabetes, no thyroid disease   Blood pressure 109/69, pulse 106, temperature 99 F (37.2 C), temperature source Oral, resp. rate 31, height 5' (1.524 m), weight 66 kg (145 lb  8.1 oz), SpO2 96.00%. PE: General:alert and oriented but easily forgets what she has told me., pleasant affect. Obviously does not feel well. Skin: very warm and dry, brisk capillary refill HEENT:normocephalic, sclera clear Neck:supple, mild JVD Heart:irreg irreg no obvious murmur Lungs:+ wheezes some crackles Abd:+ BS, soft, non tender Ext:no edema Neuro:alert and oriented X3, but forgets she has stated things.     Assessment/Plan Active Problems:  PAF with RVR 1st episode since 01/2010   Pulmonary edema  Congestive heart failure  CAP (community acquired pneumonia)  Respiratory failure with hypoxia  Hypokalemia  PLAN: Agree with IV Heparin, amiodarone for now to control rate.  Probable DCCV, with TEE in next several days. Add coumadin vs. Xarelto.   [name] 05/19/2012, 3:01 PM

## 2012-05-19 NOTE — Consult Note (Signed)
I have seen and evaluated the patient this morning along with Nada Boozer, NP. I agree with his/her findings, examination as well as impression recommendations.  Sounds like she has been getting sick with a viral syndrome with Fever & malaise + URI Sx ---> then got acutely SOB --> most likely as a result of recurrent PAF with RVR resulting in acute pulmonary edema due to exacerbation of diastolic dysfunction.  Breathing somewhat better with diuresis & supportive pulmonary care.  Lungs sound ronchorous, but without true rales now.  She is ill appearing & fatigued.    We were consulted to assist with Afib management. -- rate currently in 100-110s which may simply be sympathetic tachycardia with low grade F & infection.  Amiodarone load just started due to ineffective Diltiazem gtt (at 10 -90mcg/hr -- which is at lest 2-3 x her home PO dose).  IV Heparin initiated -- would actually consider NOAC with Eliquis / Xarelto to avoid IV volume that comes with Heparin.  CXR still has appearance of pulmonary vascular congestion -- currently on IV Lasix, can probably convert by tomorrow.   Would anticipate improvement if Rate controlled & even more if chemically cardioverted with Amiodarone.  If she were to decompensate, or become progressively more symptomatic despite Amiodarone / Lasix, may need to consider TEE guided DCCV, but would hold off for now -- not exactly a good TEE candidate.  Will need long term Anticoagulation -- see above re: NOAC option vs. Warfarin.  Marykay Lex, M.D., M.S. [facility] [address] [place], [place]  [address]  [phone] Pager # [phone] 05/19/2012 4:22 PM

## 2012-05-20 ENCOUNTER — Inpatient Hospital Stay (HOSPITAL_COMMUNITY): Payer: Medicare Other

## 2012-05-20 LAB — CBC WITH DIFFERENTIAL/PLATELET
Basophils Absolute: 0.1 K/uL (ref 0.0–0.1)
Basophils Relative: 0 % (ref 0–1)
Eosinophils Absolute: 0.1 K/uL (ref 0.0–0.7)
Eosinophils Relative: 0 % (ref 0–5)
HCT: 36.3 % (ref 36.0–46.0)
Hemoglobin: 12.2 g/dL (ref 12.0–15.0)
Lymphocytes Relative: 16 % (ref 12–46)
Lymphs Abs: 2.7 K/uL (ref 0.7–4.0)
MCH: 28.6 pg (ref 26.0–34.0)
MCHC: 33.6 g/dL (ref 30.0–36.0)
MCV: 85 fL (ref 78.0–100.0)
Monocytes Absolute: 1.8 K/uL — ABNORMAL HIGH (ref 0.1–1.0)
Monocytes Relative: 10 % (ref 3–12)
Neutro Abs: 12.8 K/uL — ABNORMAL HIGH (ref 1.7–7.7)
Neutrophils Relative %: 74 % (ref 43–77)
Platelets: 397 K/uL (ref 150–400)
RBC: 4.27 MIL/uL (ref 3.87–5.11)
RDW: 13 % (ref 11.5–15.5)
WBC: 17.5 K/uL — ABNORMAL HIGH (ref 4.0–10.5)

## 2012-05-20 LAB — BASIC METABOLIC PANEL
CO2: 26 mEq/L (ref 19–32)
Calcium: 9.2 mg/dL (ref 8.4–10.5)
Creatinine, Ser: 0.71 mg/dL (ref 0.50–1.10)

## 2012-05-20 LAB — PHOSPHORUS: Phosphorus: 4.6 mg/dL (ref 2.3–4.6)

## 2012-05-20 LAB — HEPARIN LEVEL (UNFRACTIONATED)
Heparin Unfractionated: 0.23 [IU]/mL — ABNORMAL LOW (ref 0.30–0.70)
Heparin Unfractionated: 0.3 [IU]/mL (ref 0.30–0.70)

## 2012-05-20 LAB — MAGNESIUM: Magnesium: 1.6 mg/dL (ref 1.5–2.5)

## 2012-05-20 MED ORDER — DILTIAZEM HCL 60 MG PO TABS
60.0000 mg | ORAL_TABLET | Freq: Four times a day (QID) | ORAL | Status: DC
  Administered 2012-05-20 – 2012-05-22 (×8): 60 mg via ORAL
  Filled 2012-05-20 (×12): qty 1

## 2012-05-20 MED ORDER — MAGNESIUM SULFATE 50 % IJ SOLN: 2.0000 g | Freq: Once | INTRAVENOUS | Status: DC

## 2012-05-20 MED ORDER — IPRATROPIUM BROMIDE 0.02 % IN SOLN
0.5000 mg | Freq: Four times a day (QID) | RESPIRATORY_TRACT | Status: DC
  Administered 2012-05-20 – 2012-05-24 (×15): 0.5 mg via RESPIRATORY_TRACT
  Filled 2012-05-20 (×14): qty 2.5

## 2012-05-20 MED ORDER — MAGNESIUM SULFATE 40 MG/ML IJ SOLN
2.0000 g | Freq: Once | INTRAMUSCULAR | Status: AC
  Administered 2012-05-20: 2 g via INTRAVENOUS
  Filled 2012-05-20: qty 50

## 2012-05-20 MED ORDER — ATORVASTATIN CALCIUM 10 MG PO TABS
10.0000 mg | ORAL_TABLET | Freq: Every day | ORAL | Status: DC
  Administered 2012-05-20 – 2012-05-23 (×4): 10 mg via ORAL
  Filled 2012-05-20 (×5): qty 1

## 2012-05-20 MED ORDER — HEPARIN (PORCINE) IN NACL 100-0.45 UNIT/ML-% IJ SOLN
1200.0000 [IU]/h | INTRAMUSCULAR | Status: DC
  Administered 2012-05-20 (×2): 1200 [IU]/h via INTRAVENOUS
  Filled 2012-05-20: qty 250

## 2012-05-20 MED ORDER — HEPARIN (PORCINE) IN NACL 100-0.45 UNIT/ML-% IJ SOLN
1500.0000 [IU]/h | INTRAMUSCULAR | Status: DC
  Administered 2012-05-21 – 2012-05-22 (×2): 1500 [IU]/h via INTRAVENOUS
  Filled 2012-05-20 (×3): qty 250

## 2012-05-20 MED ORDER — HEPARIN BOLUS VIA INFUSION
2000.0000 [IU] | Freq: Once | INTRAVENOUS | Status: AC
  Administered 2012-05-20: 2000 [IU] via INTRAVENOUS
  Filled 2012-05-20: qty 2000

## 2012-05-20 MED ORDER — ALBUTEROL SULFATE (5 MG/ML) 0.5% IN NEBU
2.5000 mg | INHALATION_SOLUTION | Freq: Four times a day (QID) | RESPIRATORY_TRACT | Status: DC
  Administered 2012-05-20 – 2012-05-24 (×15): 2.5 mg via RESPIRATORY_TRACT
  Filled 2012-05-20 (×15): qty 0.5

## 2012-05-20 NOTE — Progress Notes (Signed)
Rehab Admissions Coordinator Note:  Patient was screened by Clois Dupes for appropriateness for an Inpatient Acute Rehab Consult.  [employer] Medicare will not approve admission for this diagnosis.At this time, we are recommending Skilled Nursing Facility.  Clois Dupes 05/20/2012, 4:13 PM  I can be reached at [phone].

## 2012-05-20 NOTE — Progress Notes (Signed)
ANTICOAGULATION CONSULT NOTE - Follow Up Consult  Pharmacy Consult for heparin Indication: atrial fibrillation  Allergies  Allergen Reactions  . Amoxicillin     Can't remember reaction  . Doxycycline     Can't remember reaction  . Penicillins     Can't remember reaction  . Sulfa Antibiotics     Can't remember reaction    Patient Measurements: Height: 5' (152.4 cm) Weight: 145 lb 11.6 oz (66.1 kg) IBW/kg (Calculated) : 45.5   Vital Signs: Temp: 98.5 F (36.9 C) (12/19 0801) Temp src: Oral (12/19 0801) BP: 102/69 mmHg (12/19 0900) Pulse Rate: 79  (12/19 0900)  Labs:  Basename 05/20/12 1019 05/20/12 0108 05/19/12 0415 05/18/12 1046 05/18/12 0425 05/17/12 2213  HGB -- 12.2 12.1 -- -- --  HCT -- 36.3 36.8 -- 35.7* --  PLT -- 397 366 -- 361 --  APTT -- -- -- -- -- --  LABPROT -- -- -- -- -- --  INR -- -- -- -- -- --  HEPARINUNFRC 0.17* 0.23* -- -- -- --  CREATININE -- 0.71 0.67 -- 0.68 --  CKTOTAL -- -- -- -- -- --  CKMB -- -- -- -- -- --  TROPONINI -- -- -- <0.30 <0.30 <0.30    Estimated Creatinine Clearance: 49.1 ml/min (by C-G formula based on Cr of 0.71).   Medications:  Scheduled:    . albuterol  2.5 mg Nebulization Q6H  . [COMPLETED] amiodarone  150 mg Intravenous Once  . antiseptic oral rinse  15 mL Mouth Rinse BID  . aspirin EC  81 mg Oral Daily  . aztreonam  2 g Intravenous Q8H  . clopidogrel  75 mg Oral Daily  . furosemide  60 mg Intravenous BID  . ipratropium  0.5 mg Nebulization Q6H  . levofloxacin (LEVAQUIN) IV  750 mg Intravenous Q24H  . magnesium sulfate 1 - 4 g bolus IVPB  2 g Intravenous Once  . simvastatin  20 mg Oral q1800  . [DISCONTINUED] albuterol  2.5 mg Nebulization Q4H  . [DISCONTINUED] heparin subcutaneous  5,000 Units Subcutaneous Q8H  . [DISCONTINUED] ipratropium  0.5 mg Nebulization Q4H  . [DISCONTINUED] vancomycin  500 mg Intravenous Q12H   Infusions:    . sodium chloride 10 mL/hr at 05/19/12 0700  . sodium chloride Stopped  (05/20/12 0700)  . [EXPIRED] amiodarone (NEXTERONE PREMIX) 360 mg/200 mL dextrose 1 mg/min (05/19/12 1922)   Followed by  . amiodarone (NEXTERONE PREMIX) 360 mg/200 mL dextrose 0.5 mg/min (05/19/12 2113)  . diltiazem (CARDIZEM) infusion 10 mg/hr (05/20/12 0600)  . heparin 1,000 Units/hr (05/20/12 0235)    Assessment: 78 yo female with afib on heparin and level is below goal (HL=0.17). Hg/hct is stable and pltc=397. Noted for probable DCCV with TEE and coumadin vs Xarelto.  Goal of Therapy:  Heparin level 0.3-0.7 units/ml Monitor platelets by anticoagulation protocol: Yes   Plan:  -Heparin bolus 2000 units followed by increase to 1200 units/hr -Heparin level in 6 hours and daily wth CBC daily  Harland German, Pharm D 05/20/2012 11:31 AM

## 2012-05-20 NOTE — Progress Notes (Signed)
ANTICOAGULATION CONSULT NOTE - Follow Up Consult  Pharmacy Consult for heparin Indication: atrial fibrillation  Allergies  Allergen Reactions  . Amoxicillin     Can't remember reaction  . Doxycycline     Can't remember reaction  . Penicillins     Can't remember reaction  . Sulfa Antibiotics     Can't remember reaction    Patient Measurements: Height: 5' (152.4 cm) Weight: 145 lb 11.6 oz (66.1 kg) IBW/kg (Calculated) : 45.5  Heparin dosing weight: 59.6 kg  Vital Signs: Temp: 98.6 F (37 C) (12/19 2000) Temp src: Oral (12/19 2000) BP: 103/69 mmHg (12/19 2000) Pulse Rate: 98  (12/19 2000)  Labs:  Basename 05/20/12 2125 05/20/12 1019 05/20/12 0108 05/19/12 0415 05/18/12 1046 05/18/12 0425 05/17/12 2213  HGB -- -- 12.2 12.1 -- -- --  HCT -- -- 36.3 36.8 -- 35.7* --  PLT -- -- 397 366 -- 361 --  APTT -- -- -- -- -- -- --  LABPROT -- -- -- -- -- -- --  INR -- -- -- -- -- -- --  HEPARINUNFRC 0.30 0.17* 0.23* -- -- -- --  CREATININE -- -- 0.71 0.67 -- 0.68 --  CKTOTAL -- -- -- -- -- -- --  CKMB -- -- -- -- -- -- --  TROPONINI -- -- -- -- <0.30 <0.30 <0.30    Estimated Creatinine Clearance: 49.1 ml/min (by C-G formula based on Cr of 0.71).   Medications:  Scheduled:     . albuterol  2.5 mg Nebulization Q6H  . antiseptic oral rinse  15 mL Mouth Rinse BID  . aspirin EC  81 mg Oral Daily  . atorvastatin  10 mg Oral q1800  . clopidogrel  75 mg Oral Daily  . diltiazem  60 mg Oral Q6H  . furosemide  60 mg Intravenous BID  . [COMPLETED] heparin  2,000 Units Intravenous Once  . ipratropium  0.5 mg Nebulization Q6H  . levofloxacin (LEVAQUIN) IV  750 mg Intravenous Q24H  . magnesium sulfate 1 - 4 g bolus IVPB  2 g Intravenous Once  . [DISCONTINUED] albuterol  2.5 mg Nebulization Q4H  . [DISCONTINUED] aztreonam  2 g Intravenous Q8H  . [DISCONTINUED] ipratropium  0.5 mg Nebulization Q4H  . [DISCONTINUED] magnesium sulfate LVP 250-500 ml  2 g Intravenous Once  .  [DISCONTINUED] magnesium sulfate 1 - 4 g bolus IVPB  2 g Intravenous Once  . [DISCONTINUED] simvastatin  20 mg Oral q1800   Infusions:     . sodium chloride 10 mL/hr at 05/19/12 0700  . sodium chloride Stopped (05/20/12 0700)  . amiodarone (NEXTERONE PREMIX) 360 mg/200 mL dextrose 0.5 mg/min (05/20/12 1625)  . [EXPIRED] diltiazem (CARDIZEM) infusion Stopped (05/20/12 1619)  . heparin 1,200 Units/hr (05/20/12 1900)  . [DISCONTINUED] heparin 1,000 Units/hr (05/20/12 0235)    Assessment: 78 yo female with afib on heparin. Heparin level is therapeutic at 0.30 but at low end of goal. Hg/hct is stable this am and pltc=397. Noted for probable DCCV with TEE and coumadin vs Xarelto.   Goal of Therapy:  Heparin level 0.3-0.7 units/ml Monitor platelets by anticoagulation protocol: Yes   Plan:  -Increase heparin to 1300 units/hr (13 mL/hr) to maintain within goal.  -Heparin level & CBC in AM   Link Snuffer, PharmD, BCPS Clinical Pharmacist [phone] 05/20/2012 10:09 PM

## 2012-05-20 NOTE — Progress Notes (Signed)
Pt has hx. Of a. fib in 2011 prior to surgery, she was treated with rate control meds and coumadin and cardioverted 2011. She had maintained SR since DCCV until now and was off coumadin since 08/2010 but on asa and Plavix, prior to this episode. No history of Cardiac Cath, but negative myoview for ischemia in 10/2009. Last Echo was 3/13 and normal LV function with diastolic relaxation abnormality and no significant valve disease.  NOW 78 yo with history of P. atrial fibrillation who presented to [facility] ED on 12/16 with dyspnea after being seen by her PCP. Seen one week ago with influenza symptoms and started on Tamiflu. Since then had increased respiratory symptoms, weakness and decreased fluid intake as well as fevers. Denies chest pain or pedal edema. In ED noted to be in atrial fibrillation with RVR. Pt has been on Cardizem    Subjective: Appears ill, color pale, feels very weak  Objective: Vital signs in last 24 hours: Temp:  [97 F (36.1 C)-99.1 F (37.3 C)] 98.4 F (36.9 C) (12/19 1213) Pulse Rate:  [73-132] 79  (12/19 0900) Resp:  [18-33] 31  (12/19 0900) BP: (93-151)/(60-138) 102/69 mmHg (12/19 0900) SpO2:  [87 %-99 %] 99 % (12/19 0901) Weight:  [66.1 kg (145 lb 11.6 oz)] 66.1 kg (145 lb 11.6 oz) (12/19 0431) Weight change: 0.1 kg (3.5 oz) Last BM Date:  (Prior to admission) Intake/Output from previous day: -680 12/18 0701 - 12/19 0700 In: 1715.9 [P.O.:300; I.V.:1005.9; IV Piggyback:410] Out: 2350 [Urine:2350] Intake/Output this shift: Total I/O In: 93.4 [I.V.:93.4] Out: 110 [Urine:110]  PE: General:alert and oriented, but very weak, depressed Heart:irreg irreg, better rate control, though still in 90's and low 100's.  Lungs:few crackles bibasilar. Abd:+ BS, soft, non tender Ext:no edema Neuro: improved mentation today    Lab Results:  Basename 05/20/12 0108 05/19/12 0415  WBC 17.5* 17.1*  HGB 12.2 12.1  HCT 36.3 36.8  PLT 397 366   BMET  Basename 05/20/12 0108  05/19/12 0415  NA 133* 139  K 3.6 3.1*  CL 95* 99  CO2 26 26  GLUCOSE 131* 138*  BUN 12 13  CREATININE 0.71 0.67  CALCIUM 9.2 8.7    Basename 05/18/12 1046 05/18/12 0425  TROPONINI <0.30 <0.30      Lab Results  Component Value Date   TSH 0.661 05/18/2012    Hepatic Function Panel  Basename 05/19/12 0415  PROT 6.5  ALBUMIN 2.4*  AST 111*  ALT 98*  ALKPHOS 100  BILITOT 0.3  BILIDIR --  IBILI --    Studies/Results: Dg Chest Port 1 View  05/20/2012  *RADIOLOGY REPORT*  Clinical Data: Shortness of breath.  PORTABLE CHEST - 1 VIEW  Comparison: 05/16/2012.  Findings: The cardiac silhouette, mediastinal and hilar contours are stable.  Persistent bibasilar infiltrates or atelectasis and chronic interstitial thickening.  IMPRESSION: Stable chest x-ray findings.   Original Report Authenticated By: Rudie Meyer, M.D.    Dg Chest Port 1 View  05/19/2012  *RADIOLOGY REPORT*  Clinical Data: Short of breath, cough, evaluate pulmonary edema  PORTABLE CHEST - 1 VIEW  Comparison: 05/18/2012; 10/11/2009; chest CT - 05/03/2007  Findings: Grossly unchanged enlarged cardiac silhouette and mediastinal contours with atherosclerotic calcifications within a mildly tortuous thoracic aorta.  Pulmonary vasculature remains indistinct.  There is unchanged mild diffuse slightly nodular thickening of the pulmonary interstitium with basilar predominance. No new focal airspace opacities.  No definite pleural effusion or pneumothorax.  Unchanged bones.  IMPRESSION: Grossly unchanged mild diffuse  slightly nodular thickening of the pulmonary interstitium with differential considerations including again including but not limited to pulmonary edema and atypical infection.  A follow-up chest radiograph in 4 to 6 weeks after treatment is recommended to ensure resolution.   Original Report Authenticated By: Tacey Ruiz, MD    ECHO 05/18/12: Left ventricle: The cavity size was normal. There was mild concentric  hypertrophy. Systolic function was vigorous. The estimated ejection fraction was in the range of 65% to 70%. Wall motion was normal; there were no regional wall motion abnormalities. Atrial fibrillation limitsevaluation of LV diastolic function. E/e' ratio is equivocal - indeterminate LA pressure. - Mitral valve: Mildly calcified annulus. - Left atrium: The atrium was moderately to severely dilated. - Atrial septum: No defect or patent foramen ovale was identified. - Pulmonary arteries: Systolic pressure was mildly increased. PA peak pressure: 40mm Hg (S). - Pericardium, extracardiac: A trivial pericardial effusion was identified  Medications: I have reviewed the patient's current medications.    Marland Kitchen albuterol  2.5 mg Nebulization Q6H  . antiseptic oral rinse  15 mL Mouth Rinse BID  . aspirin EC  81 mg Oral Daily  . aztreonam  2 g Intravenous Q8H  . clopidogrel  75 mg Oral Daily  . furosemide  60 mg Intravenous BID  . ipratropium  0.5 mg Nebulization Q6H  . levofloxacin (LEVAQUIN) IV  750 mg Intravenous Q24H  . magnesium sulfate 1 - 4 g bolus IVPB  2 g Intravenous Once  . simvastatin  20 mg Oral q1800   Assessment/Plan: Principal Problem:  *Respiratory failure with hypoxia Active Problems:  PAF with RVR 1st episode since 01/2010   Pulmonary edema  Congestive heart failure  CAP (community acquired pneumonia)  Hypokalemia  PLAN: pt on IV Heparin, IV dilt at 10 mg/hr,  Amiodarone at  0.5 mg min, HR 90-low 100's since beginning IV amio , BP stable on lower side.  ? Add dig?  Also need for TEE DCCV- she may feel better in SR. See Dr. Elissa Hefty note from yesterday. K+ level improved.  LOS: 3 days   INGOLD,LAURA R 05/20/2012, 12:17 PM  I have seen and examined the patient along with Nada Boozer, NP.  I have reviewed the chart, notes and new data.  I agree with NP's note.  Key new complaints: weak, cough - nonproductive Key examination changes: AF persists, but now with good  rate control Key new findings / data: wbc persistently elevated at 17k.  PLAN: Continue amiodarone IV to PO. If she is still in AF tomorrow, consider TEE guided DC CV. Please keep in unit (otherwise may not be logistically possible to do CV) and keep NPO past midnight.  Thurmon Fair, MD, [facility] [phone] 05/20/2012, 2:16 PM

## 2012-05-20 NOTE — Evaluation (Signed)
Physical Therapy Evaluation Patient Details Name: [name] MRN: [id number] DOB: 01-31-1932 Today's Date: 05/20/2012 Time: 9147-8295 PT Time Calculation (min): 31 min  PT Assessment / Plan / Recommendation Clinical Impression  pt admitte with afib with RVR and flu-like symptoms/PNA.  Pt will benefit from PT to maximize functional I.    PT Assessment  Patient needs continued PT services    Follow Up Recommendations  CIR    Does the patient have the potential to tolerate intense rehabilitation      Barriers to Discharge Decreased caregiver support      Equipment Recommendations  None recommended by PT    Recommendations for Other Services     Frequency Min 3X/week    Precautions / Restrictions Precautions Precautions: Fall Restrictions Weight Bearing Restrictions: No   Pertinent Vitals/Pain EHR 113      Mobility  Bed Mobility Bed Mobility: Supine to Sit;Sitting - Scoot to Edge of Bed Supine to Sit: 4: Min assist Sitting - Scoot to [place] of Bed: 4: Min assist Details for Bed Mobility Assistance: vc's for hand placement and minimal assist to come up Transfers Transfers: Sit to Stand;Stand to Sit Sit to Stand: 4: Min assist;With upper extremity assist;From bed Stand to Sit: 4: Min assist;With upper extremity assist;To chair/3-in-1 Details for Transfer Assistance: vc's for hand placement; assist to come forward and up Ambulation/Gait Ambulation/Gait Assistance: 3: Mod assist Ambulation Distance (Feet): 45 Feet Assistive device: Rolling walker Ambulation/Gait Assistance Details: weak -kneed gait, consistently tremulous, but with improvement over time.  Needed RW as well and assist for stability Gait Pattern: Step-through pattern;Decreased step length - right;Decreased step length - left;Decreased stride length;Trunk flexed Wheelchair Mobility Wheelchair Mobility: No    Shoulder Instructions     Exercises     PT Diagnosis: Difficulty walking;Generalized  weakness  PT Problem List: Decreased strength;Decreased activity tolerance;Decreased balance;Decreased mobility;Decreased knowledge of use of DME;Cardiopulmonary status limiting activity PT Treatment Interventions: DME instruction;Gait training;Functional mobility training;Therapeutic activities;Balance training;Patient/family education   PT Goals Acute Rehab PT Goals PT Goal Formulation: With patient Time For Goal Achievement: 05/20/12 Potential to Achieve Goals: Good Pt will go Supine/Side to Sit: with supervision;with HOB 0 degrees PT Goal: Supine/Side to Sit - Progress: Goal set today Pt will go Sit to Stand: with supervision PT Goal: Sit to Stand - Progress: Goal set today Pt will Transfer Bed to Chair/Chair to Bed: with supervision PT Transfer Goal: Bed to Chair/Chair to Bed - Progress: Goal set today Pt will Ambulate: 51 - 150 feet;with supervision PT Goal: Ambulate - Progress: Goal set today  Visit Information  Last PT Received On: 05/20/12 Assistance Needed: +2 PT/OT Co-Evaluation/Treatment: Yes    Subjective Data  Subjective: He's gone to [place] paralyzed...because of a stroke..my sister's here to help...she cleans alot more than me, but she can't cook as well.Marland KitchenMarland KitchenI cook like my Dad, he was the cook for the [employer].  My husband had a stroke because of that car. Patient Stated Goal: Home eventually   Prior Functioning  Home Living Lives With: Spouse (spouse is in SNF since CVA ~ 3 wks ago) Available Help at Discharge: Family;Other (Comment);Available 24 hours/day (sister from [place], and son ) Type of Home: House Home Access: Stairs to enter [employer] of Steps: 4+ Entrance Stairs-Rails: Right;Left Home Layout: Two level;Able to live on main level with bedroom/bathroom Bathroom Shower/Tub: Walk-in shower;Door [employer] Toilet: Handicapped height Bathroom Accessibility: Yes How Accessible: Accessible via walker Home Adaptive Equipment: Bedside  commode/3-in-1;Walker - rolling;Shower chair  with back Prior Function Level of Independence: Independent Able to Take Stairs?: Yes Driving: Yes Vocation: Retired Comments: Pt reports sister had to assist with IADLs for 2-3 wks PTA Communication Communication: No difficulties Dominant Hand: Right    Cognition  Overall Cognitive Status: Impaired Area of Impairment: Attention (tangential of thought; easily distractible) Arousal/Alertness: Awake/alert Orientation Level: Appears intact for tasks assessed Behavior During Session: [facility] for tasks performed Current Attention Level: Selective    Extremity/Trunk Assessment Right Upper Extremity Assessment RUE ROM/Strength/Tone: Within functional levels RUE Sensation: WFL - Light Touch RUE Coordination: WFL - gross/fine motor Left Upper Extremity Assessment LUE ROM/Strength/Tone: Within functional levels LUE Sensation: WFL - Light Touch LUE Coordination: WFL - gross/fine motor Right Lower Extremity Assessment RLE ROM/Strength/Tone: Deficits RLE ROM/Strength/Tone Deficits: grossly 3/5 hip flexors, otherwise 3+5 Left Lower Extremity Assessment LLE ROM/Strength/Tone: Deficits LLE ROM/Strength/Tone Deficits: see R LE Trunk Assessment Trunk Assessment: Normal   Balance Balance Balance Assessed: Yes Static Sitting Balance Static Sitting - Balance Support: Feet supported;Right upper extremity supported;Left upper extremity supported Static Sitting - Level of Assistance: 5: Stand by assistance Static Sitting - Comment/# of Minutes: 8 Dynamic Sitting Balance Dynamic Sitting - Level of Assistance: 5: Stand by assistance  End of Session PT - End of Session Activity Tolerance: Patient tolerated treatment well;Patient limited by fatigue Patient left: in chair;with call bell/phone within reach Nurse Communication: Mobility status  GP     Arnesia Vincelette, [name] 05/20/2012, 4:13 PM  05/20/2012  Austin Bing, PT [phone]  (pager)

## 2012-05-20 NOTE — Progress Notes (Signed)
ANTICOAGULATION CONSULT NOTE - Initial Consult  Pharmacy Consult for heparin Indication: atrial fibrillation  Allergies  Allergen Reactions  . Amoxicillin     Can't remember reaction  . Doxycycline     Can't remember reaction  . Penicillins     Can't remember reaction  . Sulfa Antibiotics     Can't remember reaction    Patient Measurements: Height: 5' (152.4 cm) Weight: 145 lb 8.1 oz (66 kg) IBW/kg (Calculated) : 45.5   Vital Signs: Temp: 97 F (36.1 C) (12/19 0022) Temp src: Oral (12/19 0022) BP: 107/67 mmHg (12/19 0200) Pulse Rate: 88  (12/19 0200)  Labs:  Basename 05/20/12 0108 05/19/12 0415 05/18/12 1046 05/18/12 0425 05/17/12 2213  HGB 12.2 12.1 -- -- --  HCT 36.3 36.8 -- 35.7* --  PLT 397 366 -- 361 --  APTT -- -- -- -- --  LABPROT -- -- -- -- --  INR -- -- -- -- --  HEPARINUNFRC 0.23* -- -- -- --  CREATININE 0.71 0.67 -- 0.68 --  CKTOTAL -- -- -- -- --  CKMB -- -- -- -- --  TROPONINI -- -- <0.30 <0.30 <0.30    Estimated Creatinine Clearance: 49.1 ml/min (by C-G formula based on Cr of 0.71).   Medical History: Past Medical History  Diagnosis Date  . Irregular heart beat   . Hypertension   . Hypercholesteremia   . Depression   . Atrial fibrillation     Medications:  Prescriptions prior to admission  Medication Sig Dispense Refill  . acetaminophen (TYLENOL) 500 MG tablet Take 500 mg by mouth at bedtime as needed. For pain to help sleep      . aspirin EC 81 MG tablet Take 81 mg by mouth daily.      . clopidogrel (PLAVIX) 75 MG tablet Take 75 mg by mouth daily.      Marland Kitchen diltiazem (CARDIZEM CD) 180 MG 24 hr capsule Take 180 mg by mouth daily.      Marland Kitchen lisinopril (PRINIVIL,ZESTRIL) 10 MG tablet Take 10 mg by mouth daily.      . pravastatin (PRAVACHOL) 40 MG tablet Take 40 mg by mouth daily.        Assessment: subtherapeutic heparin level 0.23 no bleeding noted  Goal of Therapy:  Heparin level 0.3-0.7 units/ml Monitor platelets by anticoagulation  protocol: Yes   Plan:  Increase heparin to 1000 units/hr and recheck in 8 hours.

## 2012-05-20 NOTE — Progress Notes (Signed)
Clinical Social Work  CSW awaiting PT/OT evaluation to determine if SNF placement is needed. Patient was originally against SNF but if PT or OT recommends SNF placement then CSW will readress with patient. CSW will continue to follow.  [place], [place] [phone]

## 2012-05-20 NOTE — Progress Notes (Signed)
PULMONARY  / CRITICAL CARE MEDICINE  Name: [name] MRN: [id number] DOB: [birth year]    LOS: 3  REFERRING MD:  EDP  CHIEF COMPLAINT:  Acute dyspnea  BRIEF PATIENT DESCRIPTION: 78 yo with history of atrial fibrillation who presented to [facility] ED om 12/16 with dyspnea after being seen by her PCP.  Seen one week ago with influenza symptoms and started on Tamiflu.  Since then had increased respiratory symptoms, weakness and decreased fluid intake as well as fevers. Denies chest pain or pedal edema. In ED noted to be in atrial fibrillation with RVR.  LINES / TUBES: 12/16  Foley  CULTURES: 12/16  Urine >>> 12/16  Blood >>> 12/16 flul>>>neg  ANTIBIOTICS: 12/16 Aztreonam (pneumonia, PCN allergy) >>>12/19 12/16 Levaquin (pneumonia, atypical) >>> stop 7 days total 12/16 Vancomycin (pneumonia, community MRSA) >>>12/18  SIGNIFICANT EVENTS:  12/16  Admitted with pneumonia / pulmonary edema, afib/RVR, impending respiratory failure 12/17- improved resp status, fib rvr 12/18- rising cardizem needs, tachy 12/9- neg balance, better controlled HR   LEVEL OF CARE:  ICU  PRIMARY SERVICE:  PCCN  CONSULTANTS:    CODE STATUS:  Full  DIET:  diet  DVT Px:  Heparin  GI Px:  Not indicated   INTAKE / OUTPUT: Intake/Output      12/18 0701 - 12/19 0700 12/19 0701 - 12/20 0700   P.O. 300    I.V. (mL/kg) 1005.9 (15.2) 375.4 (5.7)   IV Piggyback 410 50   Total Intake(mL/kg) 1715.9 (26) 425.4 (6.4)   Urine (mL/kg/hr) 2350 (1.5) 888 (1.5)   Total Output 2350 888   Net -634.1 -462.6        Urine Occurrence 1 x     PHYSICAL EXAMINATION: Gen: no increased wob, appears good Neuro:  Awake, alert HEENT:  PERRL jvd wnl Cardiovascular: S1 S2 rr Irregular, controlled Lungs:  wheezing mild diffuse resolved, now apically clearer Abdomen:  Soft, nontender, bowel sounds present Musculoskeletal:  Moves all extremities, no edema Skin:  Intact  LABS:  Lab 05/20/12 0108 05/19/12 0415 05/18/12 1046  05/18/12 0425 05/17/12 2348 05/17/12 2213 05/17/12 2056 05/17/12 1648 05/17/12 1615  HGB 12.2 12.1 -- 11.9* -- -- -- -- --  WBC 17.5* 17.1* -- 17.4* -- -- -- -- --  PLT 397 366 -- 361 -- -- -- -- --  NA 133* 139 -- 142 -- -- -- -- --  K 3.6 3.1* -- -- -- -- -- -- --  CL 95* 99 -- 103 -- -- -- -- --  CO2 26 26 -- 27 -- -- -- -- --  GLUCOSE 131* 138* -- 130* -- -- -- -- --  BUN 12 13 -- 9 -- -- -- -- --  CREATININE 0.71 0.67 -- 0.68 -- -- -- -- --  CALCIUM 9.2 8.7 -- 8.1* -- -- -- -- --  MG 1.6 -- -- 1.6 -- -- -- -- --  PHOS 4.6 -- -- 2.4 -- -- -- -- --  AST -- 111* -- -- -- -- -- -- --  ALT -- 98* -- -- -- -- -- -- --  ALKPHOS -- 100 -- -- -- -- -- -- --  BILITOT -- 0.3 -- -- -- -- -- -- --  PROT -- 6.5 -- -- -- -- -- -- --  ALBUMIN -- 2.4* -- -- -- -- -- -- --  APTT -- -- -- -- -- -- -- -- --  INR -- -- -- -- -- -- -- -- --  LATICACIDVEN -- -- -- -- -- -- --  1.61 --  TROPONINI -- -- <0.30 <0.30 -- <0.30 -- -- --  PROCALCITON -- 0.67 -- 0.34 -- -- 0.37 -- --  PROBNP -- -- -- -- -- -- -- -- 1688.0*  O2SATVEN -- -- -- -- -- -- -- -- --  PHART -- -- -- -- 7.539* -- -- -- --  PCO2ART -- -- -- -- 31.5* -- -- -- --  PO2ART -- -- -- -- 66.0* -- -- -- --    Lab 05/18/12 0018  GLUCAP 133*    IMAGING: 12/19  Alveolar / interstitial airspace disease, again improved aeration after neg 600  ECG:  12/16 >>> Afib   DIAGNOSES: Principal Problem:  *Respiratory failure with hypoxia Active Problems:  PAF with RVR 1st episode since 01/2010   Pulmonary edema  Congestive heart failure  CAP (community acquired pneumonia)  Hypokalemia  ASSESSMENT / PLAN:  PULMONARY  A:  CAP.  Acute pulmonary edema IMproved P:   Gaol SpO2>92 Rate controlled, will limit diastolic chf pcxr in am  See ID Wheezing better after diuresis  CARDIOVASCULAR 2D echo - 55%, severe dil LA, Pa 40 tsh 0.667 A: Atrial fibrillation with RVR.  Suspected acute diastolic CHF.  CAD. No evidence ACS P:  Correct k,  MAg Goal MAP>60 Cardizem gttm increased,oral load and wean to off Continue ASA, Plavix, Pravachol Amio per cards, considering cardioversion if remain sin AFIB in am  And tee neg Hep continue  RENAL  A:  Normal renal function.  Hypokalemia. hypomag P:   Trend BMP in am  Replace K and mag Lasix 40 bid, maintain kvo  GASTROINTESTINAL  A:  No active issues. P:   Diet tolerated ppi  HEMATOLOGIC  A:  Leukocytosis, dvt risk P:  Hep drip for fib per pharamcy  INFECTIOUS  A:  CAP. r/o P:   Consider empiric levo Dc aztreonam Add stiop date levo 7 days 22nd  ENDOCRINE   A:  No active issues. P:   tsh wnl  NEUROLOGIC  A:  No active issues. P:   No interventions required  CLINICAL SUMMARY: 78 yo with history of atrial fibrillation who presented to [facility] ED om 12/16 with dyspnea after being seen by her PCP.  Seen one week prior with influenza symptoms and started on Tamiflu. rvr an issue , better with amio, goal to dc cardizem drip, continue neg balance. Keep in icu for possible cardioversion in am   Nelda Bucks., MD  Pulmonary and Critical Care Medicine [facility] Pager: [phone]  05/20/2012, 3:43 PM

## 2012-05-20 NOTE — Progress Notes (Signed)
Occupational Therapy Evaluation Patient Details Name: [name] MRN: [id number] DOB: 1931-09-04 Today's Date: 05/20/2012 Time: 8469-6295 OT Time Calculation (min): 29 min  OT Assessment / Plan / Recommendation Clinical Impression  This 78 y.o. female admitted with flu symptoms.  Pt. found to have PNA  A-Fib with RVR.  Pt. demonstrates the below listed deficits and will benefit from OT to maximize safety and independence with BADLs to allow pt. to return to modified independence level with BADLs after rehab    OT Assessment  Patient needs continued OT Services    Follow Up Recommendations  CIR;Supervision/Assistance - 24 hour    Barriers to Discharge Decreased caregiver support sister can provide supervision and assist wtih IADLs  Equipment Recommendations  None recommended by OT    Recommendations for Other Services    Frequency  Min 2X/week    Precautions / Restrictions Precautions Precautions: Fall Restrictions Weight Bearing Restrictions: No   Pertinent Vitals/Pain     ADL  Eating/Feeding: Independent Where Assessed - Eating/Feeding: Chair Grooming: Wash/dry hands;Wash/dry face;Teeth care;Set up Where Assessed - Grooming: Supported sitting Upper Body Bathing: Moderate assistance Where Assessed - Upper Body Bathing: Supported sitting Lower Body Bathing: Moderate assistance Where Assessed - Lower Body Bathing: Supported sit to stand Upper Body Dressing: Moderate assistance Where Assessed - Upper Body Dressing: Unsupported sitting Lower Body Dressing: Moderate assistance Where Assessed - Lower Body Dressing: Supported sit to Pharmacist, hospital: Minimal assistance Toilet Transfer Method: Sit to stand;Stand pivot Acupuncturist: Bedside commode Toileting - Clothing Manipulation and Hygiene: Moderate assistance Where Assessed - Toileting Clothing Manipulation and Hygiene: Standing Equipment Used: Rolling walker Transfers/Ambulation Related to ADLs:  Ambulates with mod A using RW ADL Comments: Pt. able to don/doff socks EOB.  Pt. fatigues quickly and requires multiple rest breaks    OT Diagnosis: Generalized weakness  OT Problem List: Decreased strength;Decreased activity tolerance;Impaired balance (sitting and/or standing);Decreased safety awareness OT Treatment Interventions: Self-care/ADL training;DME and/or AE instruction;Therapeutic activities;Patient/family education;Balance training   OT Goals Acute Rehab OT Goals OT Goal Formulation: With patient Time For Goal Achievement: 06/03/12 Potential to Achieve Goals: Good ADL Goals Pt Will Perform Grooming: with supervision;Standing at sink ADL Goal: Grooming - Progress: Goal set today Pt Will Perform Upper Body Bathing: with set-up;Sitting, chair;Sitting, edge of bed ADL Goal: Upper Body Bathing - Progress: Goal set today Pt Will Perform Lower Body Bathing: with supervision;Sit to stand from chair;Sit to stand from bed ADL Goal: Lower Body Bathing - Progress: Goal set today Pt Will Perform Upper Body Dressing: with set-up;Sitting, chair;Sitting, bed ADL Goal: Upper Body Dressing - Progress: Goal set today Pt Will Perform Lower Body Dressing: with supervision;Sit to stand from chair;Sit to stand from bed ADL Goal: Lower Body Dressing - Progress: Goal set today Pt Will Transfer to Toilet: with supervision;Ambulation;Comfort height toilet ADL Goal: Toilet Transfer - Progress: Goal set today Pt Will Perform Toileting - Clothing Manipulation: with supervision;Standing ADL Goal: Toileting - Clothing Manipulation - Progress: Goal set today Additional ADL Goal #1: Pt. will participate in 25 mins therapeutic activity with no more than 1 rest break ADL Goal: Additional Goal #1 - Progress: Goal set today  Visit Information  Last OT Received On: 05/20/12 Assistance Needed: +2    Subjective Data  Subjective: "I am (weak).  I was down for 2-3 weeks before I came in" Patient Stated Goal: To  go home   Prior Functioning     Home Living Lives With: Spouse (spouse is in SNF  since CVA ~ 3 wks ago) Available Help at Discharge: Family;Other (Comment);Available 24 hours/day (sister from [place], and son ) Type of Home: House Home Access: Stairs to enter [employer] of Steps: 4+ Entrance Stairs-Rails: Right;Left Home Layout: Two level;Able to live on main level with bedroom/bathroom Bathroom Shower/Tub: Walk-in shower;Door [employer] Toilet: Handicapped height Bathroom Accessibility: Yes How Accessible: Accessible via walker Home Adaptive Equipment: Bedside commode/3-in-1;Walker - rolling;Shower chair with back Prior Function Level of Independence: Independent Able to Take Stairs?: Yes Driving: Yes Vocation: Retired Comments: Pt reports sister had to assist with IADLs for 2-3 wks PTA Communication Communication: No difficulties Dominant Hand: Right         Vision/Perception Vision - Assessment Vision Assessment: Vision not tested Perception Perception: Within Functional Limits Praxis Praxis: Intact   Cognition  Overall Cognitive Status: Impaired Area of Impairment: Attention (tangential of thought; easily distractible) Arousal/Alertness: Awake/alert Orientation Level: Appears intact for tasks assessed Behavior During Session: [facility] for tasks performed Current Attention Level: Selective    Extremity/Trunk Assessment Right Upper Extremity Assessment RUE ROM/Strength/Tone: Within functional levels RUE Sensation: WFL - Light Touch RUE Coordination: WFL - gross/fine motor Left Upper Extremity Assessment LUE ROM/Strength/Tone: Within functional levels LUE Sensation: WFL - Light Touch LUE Coordination: WFL - gross/fine motor Right Lower Extremity Assessment RLE ROM/Strength/Tone: Deficits RLE ROM/Strength/Tone Deficits: grossly 3/5 hip flexors, otherwise 3+5 Left Lower Extremity Assessment LLE ROM/Strength/Tone: Deficits LLE ROM/Strength/Tone Deficits:  see R LE Trunk Assessment Trunk Assessment: Normal     Mobility Bed Mobility Bed Mobility: Supine to Sit;Sitting - Scoot to Edge of Bed Supine to Sit: 4: Min assist Sitting - Scoot to [place] of Bed: 4: Min assist Details for Bed Mobility Assistance: vc's for hand placement and minimal assist to come up Transfers Transfers: Sit to Stand;Stand to Sit Sit to Stand: 4: Min assist;With upper extremity assist;From bed Stand to Sit: 4: Min assist;With upper extremity assist;To chair/3-in-1 Details for Transfer Assistance: vc's for hand placement; assist to come forward and up     Shoulder Instructions     Exercise     Balance Balance Balance Assessed: Yes Static Sitting Balance Static Sitting - Balance Support: Feet supported;Right upper extremity supported;Left upper extremity supported Static Sitting - Level of Assistance: 5: Stand by assistance Static Sitting - Comment/# of Minutes: 8 Dynamic Sitting Balance Dynamic Sitting - Level of Assistance: 5: Stand by assistance   End of Session OT - End of Session Activity Tolerance: Patient tolerated treatment well Patient left: in chair;with call bell/phone within reach Nurse Communication: Mobility status  GO     Zeek Rostron M 05/20/2012, 4:08 PM

## 2012-05-21 ENCOUNTER — Encounter (HOSPITAL_COMMUNITY): Payer: Self-pay | Admitting: Certified Registered"

## 2012-05-21 ENCOUNTER — Encounter (HOSPITAL_COMMUNITY)
Admission: EM | Disposition: A | Discharge status: Skilled Nursing Facility | Payer: Self-pay | Source: Home / Self Care | Attending: Pulmonary Disease

## 2012-05-21 ENCOUNTER — Encounter (HOSPITAL_COMMUNITY): Payer: Self-pay

## 2012-05-21 ENCOUNTER — Inpatient Hospital Stay (HOSPITAL_COMMUNITY): Payer: Medicare Other

## 2012-05-21 ENCOUNTER — Inpatient Hospital Stay (HOSPITAL_COMMUNITY): Payer: Medicare Other | Admitting: Certified Registered"

## 2012-05-21 DIAGNOSIS — Z9289 Personal history of other medical treatment: Secondary | ICD-10-CM

## 2012-05-21 HISTORY — DX: Personal history of other medical treatment: Z92.89

## 2012-05-21 HISTORY — PX: TEE WITHOUT CARDIOVERSION: SHX5443

## 2012-05-21 HISTORY — PX: CARDIOVERSION: SHX1299

## 2012-05-21 LAB — CBC
HCT: 36.8 % (ref 36.0–46.0)
Hemoglobin: 12.2 g/dL (ref 12.0–15.0)
MCH: 28.4 pg (ref 26.0–34.0)
MCHC: 33.2 g/dL (ref 30.0–36.0)
MCV: 85.6 fL (ref 78.0–100.0)

## 2012-05-21 LAB — BASIC METABOLIC PANEL
BUN: 12 mg/dL (ref 6–23)
CO2: 28 mEq/L (ref 19–32)
Calcium: 8.8 mg/dL (ref 8.4–10.5)
Creatinine, Ser: 0.7 mg/dL (ref 0.50–1.10)
Creatinine, Ser: 0.61 mg/dL (ref 0.50–1.10)
GFR calc Af Amer: >90 mL/min (ref >90)
GFR calc non Af Amer: 85 mL/min — ABNORMAL LOW (ref >90)
Glucose, Bld: 151 mg/dL — ABNORMAL HIGH (ref 70–99)

## 2012-05-21 SURGERY — ECHOCARDIOGRAM, TRANSESOPHAGEAL
Anesthesia: General

## 2012-05-21 MED ORDER — POTASSIUM CHLORIDE CRYS ER 20 MEQ PO TBCR
EXTENDED_RELEASE_TABLET | ORAL | Status: AC
  Administered 2012-05-21: 40 meq via ORAL
  Filled 2012-05-21: qty 2

## 2012-05-21 MED ORDER — LEVOFLOXACIN 500 MG PO TABS
500.0000 mg | ORAL_TABLET | Freq: Every day | ORAL | Status: AC
  Administered 2012-05-21 – 2012-05-23 (×3): 500 mg via ORAL
  Filled 2012-05-21 (×3): qty 1

## 2012-05-21 MED ORDER — POTASSIUM CHLORIDE 10 MEQ/100ML IV SOLN
10.0000 meq | INTRAVENOUS | Status: DC
  Administered 2012-05-21: 10 meq via INTRAVENOUS
  Filled 2012-05-21: qty 400

## 2012-05-21 MED ORDER — BIOTENE DRY MOUTH MT LIQD
15.0000 mL | Freq: Two times a day (BID) | OROMUCOSAL | Status: DC
  Administered 2012-05-21 – 2012-05-24 (×7): 15 mL via OROMUCOSAL

## 2012-05-21 MED ORDER — LIDOCAINE VISCOUS 2 % MT SOLN
OROMUCOSAL | Status: AC
  Filled 2012-05-21: qty 15

## 2012-05-21 MED ORDER — MIDAZOLAM HCL 10 MG/2ML IJ SOLN
INTRAMUSCULAR | Status: DC | PRN
  Administered 2012-05-21 (×2): 1 mg via INTRAVENOUS

## 2012-05-21 MED ORDER — MIDAZOLAM HCL 5 MG/ML IJ SOLN
INTRAMUSCULAR | Status: AC
  Filled 2012-05-21: qty 2

## 2012-05-21 MED ORDER — HEPARIN BOLUS VIA INFUSION
2000.0000 [IU] | Freq: Once | INTRAVENOUS | Status: AC
  Administered 2012-05-21: 2000 [IU] via INTRAVENOUS
  Filled 2012-05-21: qty 2000

## 2012-05-21 MED ORDER — FENTANYL CITRATE 0.05 MG/ML IJ SOLN
INTRAMUSCULAR | Status: DC | PRN
  Administered 2012-05-21: 20 ug via INTRAVENOUS

## 2012-05-21 MED ORDER — POTASSIUM CHLORIDE CRYS ER 20 MEQ PO TBCR
EXTENDED_RELEASE_TABLET | ORAL | Status: AC
  Filled 2012-05-21: qty 2

## 2012-05-21 MED ORDER — POTASSIUM CHLORIDE CRYS ER 20 MEQ PO TBCR
40.0000 meq | EXTENDED_RELEASE_TABLET | Freq: Once | ORAL | Status: AC
  Administered 2012-05-21: 40 meq via ORAL

## 2012-05-21 MED ORDER — SENNOSIDES-DOCUSATE SODIUM 8.6-50 MG PO TABS
1.0000 | ORAL_TABLET | Freq: Two times a day (BID) | ORAL | Status: DC
  Administered 2012-05-21 – 2012-05-24 (×6): 1 via ORAL
  Filled 2012-05-21 (×7): qty 1

## 2012-05-21 MED ORDER — PROPOFOL 10 MG/ML IV BOLUS
INTRAVENOUS | Status: DC | PRN
  Administered 2012-05-21: 30 mg via INTRAVENOUS

## 2012-05-21 MED ORDER — LIDOCAINE HCL (CARDIAC) 20 MG/ML IV SOLN
INTRAVENOUS | Status: DC | PRN
  Administered 2012-05-21: 30 mg via INTRAVENOUS

## 2012-05-21 MED ORDER — BUTAMBEN-TETRACAINE-BENZOCAINE 2-2-14 % EX AERO
INHALATION_SPRAY | CUTANEOUS | Status: DC | PRN
  Administered 2012-05-21: 2 via TOPICAL

## 2012-05-21 MED ORDER — FENTANYL CITRATE 0.05 MG/ML IJ SOLN
INTRAMUSCULAR | Status: AC
  Filled 2012-05-21: qty 2

## 2012-05-21 NOTE — Progress Notes (Signed)
ANTICOAGULATION CONSULT NOTE - Follow Up Consult  Pharmacy Consult for heparin Indication: atrial fibrillation  Labs:  Basename 05/21/12 0405 05/20/12 2125 05/20/12 1019 05/20/12 0108 05/19/12 0415 05/18/12 1046  HGB -- -- -- 12.2 12.1 --  HCT -- -- -- 36.3 36.8 --  PLT -- -- -- 397 366 --  APTT -- -- -- -- -- --  LABPROT -- -- -- -- -- --  INR -- -- -- -- -- --  HEPARINUNFRC 0.16* 0.30 0.17* -- -- --  CREATININE -- -- -- 0.71 0.67 --  CKTOTAL -- -- -- -- -- --  CKMB -- -- -- -- -- --  TROPONINI -- -- -- -- -- <0.30    Assessment: 78yo female now with subtherapeutic on heparin despite rate increase after one level at low end of goal; no issues with gtt per RN.  Goal of Therapy:  Heparin level 0.3-0.7 units/ml   Plan:  Will rebolus with heparin 2000 units x1 and increase gtt by ~4 units/kg/hr to 1500 units/hr and check level in 8hr.  Colleen Can PharmD BCPS 05/21/2012,4:56 AM

## 2012-05-21 NOTE — Progress Notes (Signed)
Clinical Social Work Department CLINICAL SOCIAL WORK PLACEMENT NOTE 05/21/2012  Patient:  [name], [name]  Account Number:  [id number] Admit date:  05/17/2012  Clinical Social Worker:  Unk Lightning, LCSW  Date/time:  05/21/2012 11:00 AM  Clinical Social Work is seeking post-discharge placement for this patient at the following level of care:   SKILLED NURSING   (*CSW will update this form in Epic as items are completed)   05/21/2012  Patient/family provided with [facility] Department of Clinical Social Work's list of facilities offering this level of care within the geographic area requested by the patient (or if unable, by the patient's family).  05/21/2012  Patient/family informed of their freedom to choose among providers that offer the needed level of care, that participate in Medicare, Medicaid or managed care program needed by the patient, have an available bed and are willing to accept the patient.  05/21/2012  Patient/family informed of [facility]' ownership interest in [facility], as well as of the fact that they are under no obligation to receive care at this facility.  PASARR submitted to EDS on 05/21/2012 PASARR number received from EDS on 05/21/2012  FL2 transmitted to all facilities in geographic area requested by pt/family on  05/21/2012 FL2 transmitted to all facilities within larger geographic area on   Patient informed that his/her managed care company has contracts with or will negotiate with  certain facilities, including the following:     Patient/family informed of bed offers received:   Patient chooses bed at  Physician recommends and patient chooses bed at    Patient to be transferred to  on   Patient to be transferred to facility by   The following physician request were entered in Epic:   Additional Comments:

## 2012-05-21 NOTE — Progress Notes (Signed)
Clinical Social Work  CSW reviewed chart which stated that PT/OT recommended CIR. CIR is unavailable for patient at this time. CSW met with patient in order to discuss SNF. Patient's husband at [facility]. Patient agreeable to placement at [facility] but does not want to share a room with husband. CSW explained process and patient agreeable to SNF search at [facility]. CSW explained if [facility] unavailable then search can be expanded. CSW faxed out information and left a message for admission's coordinator at facility. CSW will continue to follow.  [place], [place] [phone]

## 2012-05-21 NOTE — Progress Notes (Signed)
Subjective: Still pale and weak, but feels slightly better.  IV Heparin continues IV amiodarone also, have weaned off IV cardizem and changed to 60 every 4 hours.  Objective: Vital signs in last 24 hours: Temp:  [98.3 F (36.8 C)-98.7 F (37.1 C)] 98.6 F (37 C) (12/20 0818) Pulse Rate:  [42-113] 42  (12/20 0800) Resp:  [17-30] 17  (12/20 0800) BP: (92-123)/(49-86) 105/64 mmHg (12/20 0800) SpO2:  [89 %-97 %] 94 % (12/20 0846) Weight:  [65.1 kg (143 lb 8.3 oz)] 65.1 kg (143 lb 8.3 oz) (12/20 0500) Weight change: -1 kg (-2 lb 3.3 oz) Last BM Date: 05/17/12 Intake/Output from previous day: -1465 12/19 0701 - 12/20 0700 In: 1202.2 [I.V.:922.2; IV Piggyback:280] Out: 2673 [Urine:2673] Intake/Output this shift: Total I/O In: 41.7 [I.V.:41.7] Out: 80 [Urine:80]  PE: General:alert and oriented, pleasant affect, obviously ill in appearance but improved from yesterday Heart:irreg irreg, no obvious murmur Lungs:diminished in the bases, no wheezes currently Abd:+ BS, soft, non tender Ext:no edema Neuro:alert and oriented.   Lab Results:  [facility] 05/21/12 0405 05/20/12 0108  WBC 17.2* 17.5*  HGB 12.2 12.2  HCT 36.8 36.3  PLT 393 397   BMET  Basename 05/21/12 0405 05/20/12 0108  NA 135 133*  K 3.0* 3.6  CL 96 95*  CO2 28 26  GLUCOSE 151* 131*  BUN 14 12  CREATININE 0.70 0.71  CALCIUM 8.8 9.2    Basename 05/18/12 1046  TROPONINI <0.30      Lab Results  Component Value Date   TSH 0.661 05/18/2012    Hepatic Function Panel  Basename 05/19/12 0415  PROT 6.5  ALBUMIN 2.4*  AST 111*  ALT 98*  ALKPHOS 100  BILITOT 0.3  BILIDIR --  IBILI --     Studies/Results: Dg Chest Port 1 View  05/21/2012  *RADIOLOGY REPORT*  Clinical Data: Cough, edema, follow-up  PORTABLE CHEST - 1 VIEW  Comparison: Portable chest x-ray of 05/20/2012  Findings: The lungs are not as well aerated and there has been some worsening of opacities throughout the lungs.  Although some these  opacities could be due to edema, pneumonia is a definite consideration in view of the asymmetry.  There is mild cardiomegaly present.  IMPRESSION: Diminished aeration with increasing opacities bilaterally. Possible asymmetrical edema but cannot exclude pneumonia particularly at the lung bases.   Original Report Authenticated By: Dwyane Dee, M.D.    Dg Chest Port 1 View  05/20/2012  *RADIOLOGY REPORT*  Clinical Data: Shortness of breath.  PORTABLE CHEST - 1 VIEW  Comparison: 05/16/2012.  Findings: The cardiac silhouette, mediastinal and hilar contours are stable.  Persistent bibasilar infiltrates or atelectasis and chronic interstitial thickening.  IMPRESSION: Stable chest x-ray findings.   Original Report Authenticated By: Rudie Meyer, M.D.     Medications: I have reviewed the patient's current medications.    Marland Kitchen albuterol  2.5 mg Nebulization Q6H  . antiseptic oral rinse  15 mL Mouth Rinse BID  . aspirin EC  81 mg Oral Daily  . atorvastatin  10 mg Oral q1800  . clopidogrel  75 mg Oral Daily  . diltiazem  60 mg Oral Q6H  . ipratropium  0.5 mg Nebulization Q6H  . levofloxacin (LEVAQUIN) IV  750 mg Intravenous Q24H   Assessment/Plan: Principal Problem:  *Respiratory failure with hypoxia Active Problems:  PAF with RVR 1st episode since 01/2010   Pulmonary edema  Congestive heart failure  CAP (community acquired pneumonia)  Hypokalemia  PLAN: in A. Fib with  HR mostly in 80-90 range occ. Up to 100. Hypokalemic today, K+ being replaced.  Will recheck prior to planned TEE DCCV at 1100am.    LOS: 4 days   [name] R 05/21/2012, 9:08 AM

## 2012-05-21 NOTE — CV Procedure (Signed)
[facility]  TEE/CARDIOVERSION NOTE   TRANSESOPHAGEAL ECHOCARDIOGRAM (TEE):  Indictation: Atrial Fibrillation  Consent:   Informed consent was obtained prior to the procedure. The risks, benefits and alternatives for the procedure were discussed and the patient comprehended these risks.  Risks include, but are not limited to, cough, sore throat, vomiting, nausea, somnolence, esophageal and stomach trauma or perforation, bleeding, low blood pressure, aspiration, pneumonia, infection, trauma to the teeth and death.    Time Out: Verified patient identification, verified procedure, site/side was marked, verified correct patient position, special equipment/implants available, medications/allergies/relevent history reviewed, required imaging and test results available. Performed  Procedure:  After a procedural time-out, the patient was given 2 mg versed and 20 mcg fentanyl for moderate sedation.  The oropharynx was anesthetized 2 sprays of topical cetacaine.  The transesophageal probe was inserted in the esophagus and stomach without difficulty and multiple views were obtained.  The patient was kept under observation until the patient left the procedure room.  The patient left the procedure room in stable condition.   Agitated microbubble saline contrast was not administered.  Complications:    Complications: None Patient did tolerate procedure well.  Findings:  1. LEFT VENTRICLE: The left ventricle is normal in structure and function without any thrombus or masses. LVEF is 55-60%.  2. RIGHT VENTRICLE:  The right ventricle is normal in structure and function without any thrombus or masses.    3. LEFT ATRIUM:  The left atrium is dilated without any thrombus or masses. No echo smoke is noted.  4. LEFT ATRIAL APPENDAGE:  The left atrial appendage is free of any thrombus or masses. It is small, posterior and unilobular.  5. ATRIAL SEPTUM:  The atrial septum is  free of any thrombus or masses.  There is no evidence for interatrial shunting by color doppler and saline microbubble.  6. RIGHT ATRIUM:  The right atrium is normal in size and function without any thrombus or masses.  7. MITRAL VALVE:  The mitral valve is normal in structure and function with trace regurgitation.  There were no vegetations or stenosis.  8. AORTIC VALVE:  The aortic valve is normal in structure and function without regurgitation.  There were no vegetations or stenosis  9. TRICUSPID VALVE:  The tricuspid valve is normal in structure and function with trace regurgitation.  There were no vegetations or stenosis  10.  PULMONIC VALVE:  The tricuspid valve is normal in structure and function without regurgitation.  There were no vegetations or stenosis.   11. AORTIC ARCH, ASCENDING AND DESCENDING AORTA:  There was mild Grade 1 atherosclerosis of the descending aorta and the distal transverse aortic arch.   CARDIOVERSION:     Time Out: Verified patient identification, verified procedure, site/side was marked, verified correct patient position, special equipment/implants available, medications/allergies/relevent history reviewed, required imaging and test results available.  Performed  Procedure:  1. Patient placed on cardiac monitor, pulse oximetry, supplemental oxygen as necessary.  2. Sedation administered per anesthesia 3. Pacer pads placed anterior and posterior chest. 4. Cardioverted 1 time(s).  5. Cardioverted at 150J biphasic.  Complications:  Complications: None Patient did tolerate procedure well.  Impression:  1. No LAA thrombus. 2. Dilated LA. 3. Normal LV function. 4. Successful cardioversion to sinus rhythm with PAC's with a single 150J biphasic shock.  Recommendations:  1. Continue IV amiodarone for 24 hours and convert to Po amiodarone 400 mg BID x 1 week, then 400 mg daily thereafter. 2. Continue  IV heparin and transition to alternative  anticoagulant (warfarin or newer equivalent agents)  Time Spent Directly with the Patient:  45 minutes   Chrystie Nose, MD, [facility] Attending Cardiologist [facility]  05/21/2012, 12:40 PM

## 2012-05-21 NOTE — Anesthesia Preprocedure Evaluation (Signed)
Anesthesia Evaluation  Patient identified by MRN, date of birth, ID band Patient awake    Reviewed: Allergy & Precautions, H&P , NPO status , Patient's Chart, lab work & pertinent test results  Airway Mallampati: III TM Distance: >3 FB Neck ROM: Full    Dental  (+) Teeth Intact   Pulmonary pneumonia -,          Cardiovascular hypertension, Pt. on medications +CHF + dysrhythmias Atrial Fibrillation     Neuro/Psych Depression    GI/Hepatic   Endo/Other    Renal/GU      Musculoskeletal   Abdominal   Peds  Hematology   Anesthesia Other Findings   Reproductive/Obstetrics                           Anesthesia Physical Anesthesia Plan  ASA: IV  Anesthesia Plan: General   Post-op Pain Management:    Induction: Intravenous  Airway Management Planned: Mask  Additional Equipment:   Intra-op Plan:   Post-operative Plan: Extubation in OR  Informed Consent: I have reviewed the patients History and Physical, chart, labs and discussed the procedure including the risks, benefits and alternatives for the proposed anesthesia with the patient or authorized representative who has indicated his/her understanding and acceptance.   Dental advisory given  Plan Discussed with: CRNA, Anesthesiologist and Surgeon  Anesthesia Plan Comments:         Anesthesia Quick Evaluation

## 2012-05-21 NOTE — Progress Notes (Signed)
PULMONARY  / CRITICAL CARE MEDICINE  Name: [name] MRN: [id number] DOB: [birth year]    LOS: 4  REFERRING MD:  EDP  CHIEF COMPLAINT:  Acute dyspnea  BRIEF PATIENT DESCRIPTION: 78 yo with history of atrial fibrillation who presented to [facility] ED om 12/16 with dyspnea after being seen by her PCP.  Seen one week ago with influenza symptoms and started on Tamiflu.  Since then had increased respiratory symptoms, weakness and decreased fluid intake as well as fevers. Denies chest pain or pedal edema. In ED noted to be in atrial fibrillation with RVR.  LINES / TUBES:   CULTURES: 12/16  Urine >> NEG 12/16  Blood >> NEG 12/16 flu >> NEG  ANTIBIOTICS: 12/16 Aztreonam (pneumonia, PCN allergy) >>>12/19 12/16 Vancomycin (pneumonia, community MRSA) >>>12/18 12/16 Levaquin (pneumonia, atypical) >> 12/23 (stop date ordered)  SIGNIFICANT EVENTS:  12/16  Admitted with pneumonia / pulmonary edema, afib/RVR, impending respiratory failure 12/17- improved resp status, fib rvr 12/18- rising cardizem needs, tachy 12/19- neg balance, better controlled HR  12/20 TEE: Normal LVEF, dilated LA, no thrombus 12/20: successful DCCV  LEVEL OF CARE:  ICU PRIMARY SERVICE:  PCCN CONSULTANTS:   CODE STATUS:  Full DIET:  heart DVT Px:  Full Heparin GI Px:  Not indicated   INTAKE / OUTPUT: Intake/Output      12/19 0701 - 12/20 0700 12/20 0701 - 12/21 0700   P.O.     I.V. (mL/kg) 922.2 (14.2) 300.2 (4.6)   IV Piggyback 280    Total Intake(mL/kg) 1202.2 (18.5) 300.2 (4.6)   Urine (mL/kg/hr) 2673 (1.7) 1175 (2)   Total Output 2673 1175   Net -1470.8 -874.8         PHYSICAL EXAMINATION: Gen: no increased wob, appears good Neuro:  Awake, alert, intact HEENT:  PERRL jvd wnl Cardiovascular: S1 S2 rr Irregular, controlled Lungs:  R>L basilar rales, no wheezes noted Abdomen:  Soft, nontender, bowel sounds present Musculoskeletal:  no edema  LABS:  Lab 05/21/12 1040 05/21/12 0405 05/20/12 0108  05/19/12 0415 05/18/12 1046 05/18/12 0425 05/17/12 2348 05/17/12 2213 05/17/12 2056 05/17/12 1648 05/17/12 1615  HGB -- 12.2 12.2 12.1 -- -- -- -- -- -- --  WBC -- 17.2* 17.5* 17.1* -- -- -- -- -- -- --  PLT -- 393 397 366 -- -- -- -- -- -- --  NA 137 135 133* -- -- -- -- -- -- -- --  K 3.5 3.0* -- -- -- -- -- -- -- -- --  CL 97 96 95* -- -- -- -- -- -- -- --  CO2 26 28 26  -- -- -- -- -- -- -- --  GLUCOSE 127* 151* 131* -- -- -- -- -- -- -- --  BUN 12 14 12  -- -- -- -- -- -- -- --  CREATININE 0.61 0.70 0.71 -- -- -- -- -- -- -- --  CALCIUM 8.6 8.8 9.2 -- -- -- -- -- -- -- --  MG -- 2.2 1.6 -- -- 1.6 -- -- -- -- --  PHOS -- 2.9 4.6 -- -- 2.4 -- -- -- -- --  AST -- -- -- 111* -- -- -- -- -- -- --  ALT -- -- -- 98* -- -- -- -- -- -- --  ALKPHOS -- -- -- 100 -- -- -- -- -- -- --  BILITOT -- -- -- 0.3 -- -- -- -- -- -- --  PROT -- -- -- 6.5 -- -- -- -- -- -- --  ALBUMIN -- -- -- 2.4* -- -- -- -- -- -- --  APTT -- -- -- -- -- -- -- -- -- -- --  INR -- -- -- -- -- -- -- -- -- -- --  LATICACIDVEN -- -- -- -- -- -- -- -- -- 1.61 --  TROPONINI -- -- -- -- <0.30 <0.30 -- <0.30 -- -- --  PROCALCITON -- -- -- 0.67 -- 0.34 -- -- 0.37 -- --  PROBNP -- -- -- -- -- -- -- -- -- -- 1688.0*  O2SATVEN -- -- -- -- -- -- -- -- -- -- --  PHART -- -- -- -- -- -- 7.539* -- -- -- --  PCO2ART -- -- -- -- -- -- 31.5* -- -- -- --  PO2ART -- -- -- -- -- -- 66.0* -- -- -- --    Lab 05/18/12 0018  GLUCAP 133*    IMAGING: 12/19  Alveolar / interstitial airspace disease, again improved aeration after neg 600  ECG:  AF with CVR on tele  DIAGNOSES: Principal Problem:  *Respiratory failure with hypoxia Active Problems:  PAF with RVR 1st episode since 01/2010   Pulmonary edema  Congestive heart failure  CAP (community acquired pneumonia)  Hypokalemia  PLAN: Monitor in ICU post TEE CV Anticipate transfer out of ICU to tele 12/21 AM  Can probably go to [facility] Cont abx as above Diuresis as tolerated by BP  and renal function Replete K+ as needed Cards managing AF - ? Transition to PO amiodarone soon. ? Warfarin soon   Billy Fischer, MD ; [facility] [phone].  After 5:30 PM or weekends, call [phone]

## 2012-05-21 NOTE — Anesthesia Postprocedure Evaluation (Signed)
  Anesthesia Post-op Note  Patient: [name]  Procedure(s) Performed: Procedure(s) (LRB) with comments: TRANSESOPHAGEAL ECHOCARDIOGRAM (TEE) (N/A) CARDIOVERSION (N/A)  Patient Location: Endoscopy Unit  Anesthesia Type:General  Level of Consciousness: awake  Airway and Oxygen Therapy: Patient Spontanous Breathing and Patient connected to nasal cannula oxygen  Post-op Pain: none  Post-op Assessment: Post-op Vital signs reviewed, Patient's Cardiovascular Status Stable, Respiratory Function Stable, Patent Airway and No signs of Nausea or vomiting  Post-op Vital Signs: Reviewed and stable  Complications: No apparent anesthesia complications

## 2012-05-21 NOTE — Progress Notes (Signed)
[facility] ADULT ICU REPLACEMENT PROTOCOL FOR AM LAB REPLACEMENT ONLY  The patient does apply for the [facility] Adult ICU Electrolyte Replacment Protocol based on the criteria listed below:   1. Is GFR >/= 50 ml/min? yes  Patient's GFR today is 81 2. Is urine output >/= 0.5 ml/kg/hr for the last 8 hours? yes Patient's UOP is 0.9 ml/kg/hr 3. Is BUN < 30 mg/dL? yes  Patient's BUN today is 14 4. Abnormal electrolyte(s): K 3.0 5. Ordered repletion with: per protocol 6. If a panic level lab has been reported, has the CCM MD in charge been notified? yes.   Physician:  Dr Pedro Earls, Melissia Lahman A 05/21/2012 5:21 AM

## 2012-05-21 NOTE — Transfer of Care (Signed)
Immediate Anesthesia Transfer of Care Note  Patient: [name]  Procedure(s) Performed: Procedure(s) (LRB) with comments: TRANSESOPHAGEAL ECHOCARDIOGRAM (TEE) (N/A) CARDIOVERSION (N/A)  Patient Location: Endoscopy Unit  Anesthesia Type:General  Level of Consciousness: awake  Airway & Oxygen Therapy: Patient Spontanous Breathing and Patient connected to nasal cannula oxygen  Post-op Assessment: Report given to PACU RN, Post -op Vital signs reviewed and stable and Patient moving all extremities  Post vital signs: Reviewed and stable  Complications: No apparent anesthesia complications

## 2012-05-21 NOTE — Progress Notes (Signed)
Echocardiogram Echocardiogram Transesophageal has been performed.  Jamieson Lisa 05/21/2012, 1:03 PM

## 2012-05-21 NOTE — H&P (Signed)
     [facility]          INTERVAL PROCEDURE H&P   History and Physical Interval Note:  05/21/2012 12:20 PM  [name] has presented today for their planned procedure. The various methods of treatment have been discussed with the patient and family. After consideration of risks, benefits and other options for treatment, the patient has consented to the procedure.  The patients' outpatient history has been reviewed, patient examined, and no change in status from most recent office note within the past 30 days. I have reviewed the patients' chart and labs and will proceed as planned. Questions were answered to the patient's satisfaction.   Chrystie Nose, MD, [facility] Attending Cardiologist [facility]  Jadore Mcguffin C 05/21/2012, 12:20 PM

## 2012-05-21 NOTE — Progress Notes (Signed)
Pt. Seen and examined. Agree with the NP/PA-C note as written.  Reviewed risk and benefit profile for TEE today. A-fib but controlled ventricular rate on heparin, IV amiodarone and cardizem po.  She wishes to proceed with TEE and possible cardioversion.  Chrystie Nose, MD, [facility] Attending Cardiologist [facility]

## 2012-05-21 NOTE — Preoperative (Signed)
Beta Blockers   Reason not to administer Beta Blockers:Not Applicable 

## 2012-05-21 NOTE — Progress Notes (Signed)
ANTICOAGULATION CONSULT NOTE - Follow Up Consult  Pharmacy Consult for heparin Indication: atrial fibrillation  Labs:  Basename 05/21/12 1417 05/21/12 1040 05/21/12 0405 05/20/12 2125 05/20/12 0108 05/19/12 0415  HGB -- -- 12.2 -- 12.2 --  HCT -- -- 36.8 -- 36.3 36.8  PLT -- -- 393 -- 397 366  APTT -- -- -- -- -- --  LABPROT -- -- -- -- -- --  INR -- -- -- -- -- --  HEPARINUNFRC 0.49 -- 0.16* 0.30 -- --  CREATININE -- 0.61 0.70 -- 0.71 --  CKTOTAL -- -- -- -- -- --  CKMB -- -- -- -- -- --  TROPONINI -- -- -- -- -- --    Assessment: 80 yof continues on heparin for afib. S/p TEE/DCCV and now in NSR. Heparin level is now therapeutic. No bleeding noted. CBC is stable. Cardiology recommended starting coumadin or another oral anticoagulation agent.   Goal of Therapy:  Heparin level 0.3-0.7 units/ml   Plan:  1. Continue heparin gtt at 1500 units/hr 2. F/u AM heparin level; 3. F/u start of oral anticoagulation  Lysle Pearl, PharmD, BCPS Pager # [phone] 05/21/2012 3:12 PM

## 2012-05-22 DIAGNOSIS — R7989 Other specified abnormal findings of blood chemistry: Secondary | ICD-10-CM | POA: Diagnosis present

## 2012-05-22 DIAGNOSIS — I5031 Acute diastolic (congestive) heart failure: Secondary | ICD-10-CM

## 2012-05-22 LAB — BASIC METABOLIC PANEL
Calcium: 8.9 mg/dL (ref 8.4–10.5)
Creatinine, Ser: 0.59 mg/dL (ref 0.50–1.10)
GFR calc non Af Amer: 86 mL/min — ABNORMAL LOW (ref >90)
Glucose, Bld: 115 mg/dL — ABNORMAL HIGH (ref 70–99)
Sodium: 137 mEq/L (ref 135–145)

## 2012-05-22 LAB — CBC
HCT: 36 % (ref 36.0–46.0)
MCH: 28.4 pg (ref 26.0–34.0)
MCHC: 32.8 g/dL (ref 30.0–36.0)
RDW: 13.3 % (ref 11.5–15.5)

## 2012-05-22 LAB — PROTIME-INR: INR: 1.18 (ref 0.00–1.49)

## 2012-05-22 MED ORDER — ENOXAPARIN SODIUM 80 MG/0.8ML ~~LOC~~ SOLN
65.0000 mg | Freq: Two times a day (BID) | SUBCUTANEOUS | Status: DC
  Administered 2012-05-23 – 2012-05-24 (×3): 65 mg via SUBCUTANEOUS
  Filled 2012-05-22 (×5): qty 0.8

## 2012-05-22 MED ORDER — COUMADIN BOOK
Freq: Once | Status: AC
  Administered 2012-05-22: 22:00:00
  Filled 2012-05-22 (×2): qty 1

## 2012-05-22 MED ORDER — AMIODARONE HCL 200 MG PO TABS
400.0000 mg | ORAL_TABLET | Freq: Two times a day (BID) | ORAL | Status: DC
  Administered 2012-05-22 – 2012-05-24 (×5): 400 mg via ORAL
  Filled 2012-05-22 (×6): qty 2

## 2012-05-22 MED ORDER — AMIODARONE HCL 200 MG PO TABS: 400.0000 mg | ORAL_TABLET | Freq: Every day | ORAL | Status: DC

## 2012-05-22 MED ORDER — ENOXAPARIN SODIUM 80 MG/0.8ML ~~LOC~~ SOLN
65.0000 mg | Freq: Once | SUBCUTANEOUS | Status: AC
  Administered 2012-05-22: 65 mg via SUBCUTANEOUS
  Filled 2012-05-22: qty 0.8

## 2012-05-22 MED ORDER — WARFARIN VIDEO
Freq: Once | Status: AC
Start: 2012-05-22 — End: 2012-05-23
  Administered 2012-05-23: 17:00:00

## 2012-05-22 MED ORDER — WARFARIN SODIUM 2.5 MG PO TABS
2.5000 mg | ORAL_TABLET | Freq: Once | ORAL | Status: AC
  Administered 2012-05-22: 2.5 mg via ORAL
  Filled 2012-05-22: qty 1

## 2012-05-22 MED ORDER — WARFARIN - PHARMACIST DOSING INPATIENT: Freq: Every day | Status: DC

## 2012-05-22 NOTE — Progress Notes (Signed)
PULMONARY  / CRITICAL CARE MEDICINE  Name: [name] MRN: [id number] DOB: 1931-12-29    LOS: 5  REFERRING MD:  EDP  CHIEF COMPLAINT:  Acute dyspnea  BRIEF PATIENT DESCRIPTION: 78 yo with history of atrial fibrillation who presented to [facility] ED om 12/16 with dyspnea after being seen by her PCP.  Seen one week ago with influenza symptoms and started on Tamiflu.  Since then had increased respiratory symptoms, weakness and decreased fluid intake as well as fevers. Denies chest pain or pedal edema. In ED noted to be in atrial fibrillation with RVR.  LINES / TUBES:   CULTURES: 12/16  Urine >> NEG 12/16  Blood >> NEG 12/16 flu >> NEG  ANTIBIOTICS: 12/16 Aztreonam (pneumonia, PCN allergy) >>>12/19 12/16 Vancomycin (pneumonia, community MRSA) >>>12/18 12/16 Levaquin (pneumonia, atypical) >> 12/23 (stop date ordered)  SIGNIFICANT EVENTS:  12/16  Admitted with pneumonia / pulmonary edema, afib/RVR, impending respiratory failure 12/17- improved resp status, fib rvr 12/18- rising cardizem needs, tachy 12/19- neg balance, better controlled HR  12/20 TEE: Normal LVEF, dilated LA, no thrombus 12/20: successful DCCV  LEVEL OF CARE:  ICU PRIMARY SERVICE:  PCCN CONSULTANTS:   CODE STATUS:  Full DIET:  heart DVT Px:  Full Heparin GI Px:  Not indicated Vent Mode:  [-] PRVC Set Rate:  [18 bmp] 18 bmp Vt Set:  [550 mL] 550 mL PEEP:  [5 cmH20] 5 cmH20 Plateau Pressure:  [17 cmH20] 17 cmH20 INTAKE / OUTPUT: Intake/Output      12/20 0701 - 12/21 0700 12/21 0701 - 12/22 0700   P.O. 480    I.V. (mL/kg) 1009.1 (15.3) 41.7 (0.6)   IV Piggyback     Total Intake(mL/kg) 1489.1 (22.5) 41.7 (0.6)   Urine (mL/kg/hr) 1565 (1) 180 (0.7)   Total Output 1565 180   Net -75.9 -138.3        Urine Occurrence 2 x     PHYSICAL EXAMINATION: Gen: no increased wob, appears good Neuro:  Awake, alert, intact HEENT:  PERRL jvd wnl Cardiovascular: S1 S2 rr Irregular, controlled Lungs:  R>L basilar  rales, no wheezes noted Abdomen:  Soft, nontender, bowel sounds present Musculoskeletal:  no edema  LABS:  Lab 05/22/12 0625 05/21/12 1040 05/21/12 0405 05/20/12 0108 05/19/12 0415 05/18/12 1046 05/18/12 0425 05/17/12 2348 05/17/12 2213 05/17/12 2056 05/17/12 1648 05/17/12 1615  HGB 11.8* -- 12.2 12.2 -- -- -- -- -- -- -- --  WBC 15.4* -- 17.2* 17.5* -- -- -- -- -- -- -- --  PLT 397 -- 393 397 -- -- -- -- -- -- -- --  NA 137 137 135 -- -- -- -- -- -- -- -- --  K 3.9 3.5 -- -- -- -- -- -- -- -- -- --  CL 100 97 96 -- -- -- -- -- -- -- -- --  CO2 26 26 28  -- -- -- -- -- -- -- -- --  GLUCOSE 115* 127* 151* -- -- -- -- -- -- -- -- --  BUN 15 12 14  -- -- -- -- -- -- -- -- --  CREATININE 0.59 0.61 0.70 -- -- -- -- -- -- -- -- --  CALCIUM 8.9 8.6 8.8 -- -- -- -- -- -- -- -- --  MG -- -- 2.2 1.6 -- -- 1.6 -- -- -- -- --  PHOS -- -- 2.9 4.6 -- -- 2.4 -- -- -- -- --  AST -- -- -- -- 111* -- -- -- -- -- -- --  ALT -- -- -- -- 98* -- -- -- -- -- -- --  ALKPHOS -- -- -- -- 100 -- -- -- -- -- -- --  BILITOT -- -- -- -- 0.3 -- -- -- -- -- -- --  PROT -- -- -- -- 6.5 -- -- -- -- -- -- --  ALBUMIN -- -- -- -- 2.4* -- -- -- -- -- -- --  APTT -- -- -- -- -- -- -- -- -- -- -- --  INR -- -- -- -- -- -- -- -- -- -- -- --  LATICACIDVEN -- -- -- -- -- -- -- -- -- -- 1.61 --  TROPONINI -- -- -- -- -- <0.30 <0.30 -- <0.30 -- -- --  PROCALCITON -- -- -- -- 0.67 -- 0.34 -- -- 0.37 -- --  PROBNP -- -- -- -- -- -- -- -- -- -- -- 1688.0*  O2SATVEN -- -- -- -- -- -- -- -- -- -- -- --  PHART -- -- -- -- -- -- -- 7.539* -- -- -- --  PCO2ART -- -- -- -- -- -- -- 31.5* -- -- -- --  PO2ART -- -- -- -- -- -- -- 66.0* -- -- -- --    Lab 05/18/12 0018  GLUCAP 133*    IMAGING: 12/21 no xray  12/20:  Decreased aeration   ECG:  AF with CVR on tele  DIAGNOSES: Principal Problem:  *Respiratory failure with hypoxia Active Problems:  PAF with RVR 1st episode since 01/2010, TEE CV 05/21/12  Acute diastolic  congestive heart failure  CAP (community acquired pneumonia)  Hypokalemia  Elevated LFTs  PLAN: Transfer out of ICU to tele 12/21 AM TRH to assume care 12/22 AM and PCCM to sign off Cont abx as above - stop date ordered Diuresis as tolerated by BP and renal function Replete K+ as needed Cards managing AF -  Transition to PO amiodarone and Warfarin per cards    PARRETT,TAMMY NP-C  [phone]  I have interviewed and examined the patient and reviewed the database. I have formulated the assessment and plan as reflected in the note above with amendments made by me.   Billy Fischer, MD;  PCCM service; Mobile [phone]

## 2012-05-22 NOTE — Progress Notes (Signed)
ANTICOAGULATION CONSULT NOTE - Follow Up Consult  Pharmacy Consult for Heparin/Warfarin  Indication: atrial fibrillation  Allergies  Allergen Reactions  . Amoxicillin     Can't remember reaction  . Doxycycline     Can't remember reaction  . Penicillins     Can't remember reaction  . Sulfa Antibiotics     Can't remember reaction    Patient Measurements: Height: 5' (152.4 cm) Weight: 145 lb 11.6 oz (66.1 kg) IBW/kg (Calculated) : 45.5   Vital Signs: Temp: 98.6 F (37 C) (12/21 0752) Temp src: Oral (12/21 0752) BP: 87/74 mmHg (12/21 0800) Pulse Rate: 62  (12/21 0800)  Labs:  Basename 05/22/12 0625 05/21/12 1417 05/21/12 1040 05/21/12 0405 05/20/12 0108  HGB 11.8* -- -- 12.2 --  HCT 36.0 -- -- 36.8 36.3  PLT 397 -- -- 393 397  APTT -- -- -- -- --  LABPROT -- -- -- -- --  INR -- -- -- -- --  HEPARINUNFRC 0.60 0.49 -- 0.16* --  CREATININE 0.59 -- 0.61 0.70 --  CKTOTAL -- -- -- -- --  CKMB -- -- -- -- --  TROPONINI -- -- -- -- --    Estimated Creatinine Clearance: 49.1 ml/min (by C-G formula based on Cr of 0.59).   Medications:  Scheduled:    . albuterol  2.5 mg Nebulization Q6H  . amiodarone  400 mg Oral BID  . amiodarone  400 mg Oral Daily  . antiseptic oral rinse  15 mL Mouth Rinse BID  . aspirin EC  81 mg Oral Daily  . atorvastatin  10 mg Oral q1800  . ipratropium  0.5 mg Nebulization Q6H  . levofloxacin  500 mg Oral Daily  . [COMPLETED] potassium chloride SA      . [COMPLETED] potassium chloride  40 mEq Oral Once  . senna-docusate  1 tablet Oral BID  . [DISCONTINUED] clopidogrel  75 mg Oral Daily  . [DISCONTINUED] diltiazem  60 mg Oral Q6H  . [DISCONTINUED] levofloxacin (LEVAQUIN) IV  750 mg Intravenous Q24H    Assessment: 78 y/o female with h/o afib s/p DCCV/TEE on 12/20 who continues with heparin and is to start warfarin per pharmacy. No warfarin PTA. H/H/Plts stable, no evidence of bleeding reported, HL this AM was 0.60 on 1500 units/hr. Will start  warfarin at a lower dose given that the patient is on amiodarone and levaquin.  Goal of Therapy:  INR 2-3 Heparin level 0.3-0.7 units/ml Monitor platelets by anticoagulation protocol: Yes   Plan:  - Warfarin 2.5mg  x 1 tonight at 1800 - Continue heparin at 1500 units/hr - Continue heparin drip until INR is >2 - Daily CBC/HL - Daily PT/INR - Monitor closely for bleeding  Abran Duke, PharmD Clinical Pharmacist Phone: [phone] Pager: [phone] 05/22/2012 9:44 AM

## 2012-05-22 NOTE — Progress Notes (Signed)
ANTICOAGULATION CONSULT NOTE - Follow Up Consult  Pharmacy Consult for lovenox/Warfarin  Indication: atrial fibrillation  Allergies  Allergen Reactions  . Amoxicillin     Can't remember reaction  . Doxycycline     Can't remember reaction  . Penicillins     Can't remember reaction  . Sulfa Antibiotics     Can't remember reaction    Patient Measurements: Height: 5' (152.4 cm) Weight: 145 lb 11.6 oz (66.1 kg) IBW/kg (Calculated) : 45.5   Vital Signs: Temp: 98.6 F (37 C) (12/21 0752) Temp src: Oral (12/21 0752) BP: 87/74 mmHg (12/21 0800) Pulse Rate: 62  (12/21 0800)  Labs:  Basename 05/22/12 0625 05/21/12 1417 05/21/12 1040 05/21/12 0405 05/20/12 0108  HGB 11.8* -- -- 12.2 --  HCT 36.0 -- -- 36.8 36.3  PLT 397 -- -- 393 397  APTT -- -- -- -- --  LABPROT -- -- -- -- --  INR -- -- -- -- --  HEPARINUNFRC 0.60 0.49 -- 0.16* --  CREATININE 0.59 -- 0.61 0.70 --  CKTOTAL -- -- -- -- --  CKMB -- -- -- -- --  TROPONINI -- -- -- -- --    Estimated Creatinine Clearance: 49.1 ml/min (by C-G formula based on Cr of 0.59).   Medications:  Scheduled:     . albuterol  2.5 mg Nebulization Q6H  . amiodarone  400 mg Oral BID  . amiodarone  400 mg Oral Daily  . antiseptic oral rinse  15 mL Mouth Rinse BID  . aspirin EC  81 mg Oral Daily  . atorvastatin  10 mg Oral q1800  . ipratropium  0.5 mg Nebulization Q6H  . levofloxacin  500 mg Oral Daily  . [COMPLETED] potassium chloride SA      . [COMPLETED] potassium chloride  40 mEq Oral Once  . senna-docusate  1 tablet Oral BID  . warfarin  2.5 mg Oral ONCE-1800  . Warfarin - Pharmacist Dosing Inpatient   Does not apply q1800  . [DISCONTINUED] clopidogrel  75 mg Oral Daily  . [DISCONTINUED] diltiazem  60 mg Oral Q6H    Assessment: 78 y/o female with h/o afib s/p DCCV/TEE on 12/20 on heparin to change to lovenox. Patient also on coumadin (new start, day 1).  Goal of Therapy:  INR 2-3 Heparin level 0.3-0.7 units/ml Monitor  platelets by anticoagulation protocol: Yes   Plan:  -Lovenox 65 mg [place] q12h - CBC every 3 days - Daily PT/INR (on coumadin) - Monitor closely for bleeding  Harland German, Pharm D 05/22/2012 12:23 PM

## 2012-05-22 NOTE — Progress Notes (Signed)
Subjective:  Still SOB. Holding NSR  Objective:  Vital Signs in the last 24 hours: Temp:  [96.7 F (35.9 C)-100.1 F (37.8 C)] 98.6 F (37 C) (12/21 0752) Pulse Rate:  [60-98] 62  (12/21 0800) Resp:  [15-42] 23  (12/21 0800) BP: (73-120)/(47-75) 87/74 mmHg (12/21 0800) SpO2:  [91 %-100 %] 94 % (12/21 0844) Weight:  [66.1 kg (145 lb 11.6 oz)] 66.1 kg (145 lb 11.6 oz) (12/21 0500)  Intake/Output from previous day:  Intake/Output Summary (Last 24 hours) at 05/22/12 0913 Last data filed at 05/22/12 0800  Gross per 24 hour  Intake 1447.4 ml  Output   1665 ml  Net -217.6 ml    Physical Exam: General appearance: alert and no distress Lungs: decreased breath sounds Heart: regular rate and rhythm   Rate: 65  Rhythm: normal sinus rhythm  Lab Results:  Basename 05/22/12 0625 05/21/12 0405  WBC 15.4* 17.2*  HGB 11.8* 12.2  PLT 397 393    Basename 05/22/12 0625 05/21/12 1040  NA 137 137  K 3.9 3.5  CL 100 97  CO2 26 26  GLUCOSE 115* 127*  BUN 15 12  CREATININE 0.59 0.61   No results found for this basename: TROPONINI:2,CK,MB:2 in the last 72 hours Hepatic Function Panel No results found for this basename: PROT,ALBUMIN,AST,ALT,ALKPHOS,BILITOT,BILIDIR,IBILI in the last 72 hours No results found for this basename: CHOL in the last 72 hours No results found for this basename: INR in the last 72 hours  Imaging: Imaging results have been reviewed  Cardiac Studies:  Assessment/Plan:   Principal Problem:  *Respiratory failure with hypoxia Active Problems:  PAF with RVR 1st episode since 01/2010, TEE CV 05/21/12  Acute diastolic congestive heart failure  CAP (community acquired pneumonia)  Elevated LFTs  Hypokalemia  Plan- Change to PO Amio, Stop Diltiazem as her B/P is soft. She may need further diuresis. Pharmacy for Coumadin, stop Plavix.    Corine Shelter PA-C 05/22/2012, 9:13 AM    Agree with note written by Corine Shelter [facility]  S/P TEE guided DCCV to NSR. On  Amio drip. Will transition to PO amio load, , iv hep to coumadin A/C. Nl LV FXN by 2D. BNP was mildly elevated at 1600. Diuresed yesterday. WBC decreasing. Pt appears depressed.  Runell Gess 05/22/2012 9:22 AM

## 2012-05-23 ENCOUNTER — Inpatient Hospital Stay (HOSPITAL_COMMUNITY): Payer: Medicare Other

## 2012-05-23 DIAGNOSIS — J8410 Pulmonary fibrosis, unspecified: Secondary | ICD-10-CM | POA: Diagnosis present

## 2012-05-23 LAB — CBC
HCT: 34.9 % — ABNORMAL LOW (ref 36.0–46.0)
Hemoglobin: 11.4 g/dL — ABNORMAL LOW (ref 12.0–15.0)
RBC: 4.03 MIL/uL (ref 3.87–5.11)
RDW: 13.3 % (ref 11.5–15.5)
WBC: 13.9 10*3/uL — ABNORMAL HIGH (ref 4.0–10.5)

## 2012-05-23 LAB — CULTURE, BLOOD (ROUTINE X 2): Culture: NO GROWTH

## 2012-05-23 LAB — BASIC METABOLIC PANEL
BUN: 12 mg/dL (ref 6–23)
CO2: 25 mEq/L (ref 19–32)
Chloride: 101 mEq/L (ref 96–112)
GFR calc Af Amer: >90 mL/min (ref >90)
Potassium: 3.9 mEq/L (ref 3.5–5.1)

## 2012-05-23 LAB — PROTIME-INR: INR: 1.11 (ref 0.00–1.49)

## 2012-05-23 MED ORDER — WARFARIN SODIUM 2.5 MG PO TABS
2.5000 mg | ORAL_TABLET | Freq: Once | ORAL | Status: AC
  Administered 2012-05-23: 2.5 mg via ORAL
  Filled 2012-05-23: qty 1

## 2012-05-23 MED ORDER — LISINOPRIL 5 MG PO TABS
5.0000 mg | ORAL_TABLET | Freq: Every day | ORAL | Status: DC
  Administered 2012-05-23 – 2012-05-24 (×2): 5 mg via ORAL
  Filled 2012-05-23 (×2): qty 1

## 2012-05-23 NOTE — Progress Notes (Addendum)
PCCM transfer 12/22 Name: [name] MRN: [id number] DOB: April 10, 1932    LOS: 6    BRIEF PATIENT DESCRIPTION: 78 yo with history of atrial fibrillation who presented to [facility] ED om 12/16 with dyspnea after being seen by her PCP.  Seen one week ago with influenza symptoms and started on Tamiflu.  Since then had increased respiratory symptoms, weakness and decreased fluid intake as well as fevers. Denies chest pain or pedal edema. In ED noted to be in atrial fibrillation with RVR.  LINES / TUBES:   CULTURES: 12/16  Urine >> NEG 12/16  Blood >> NEG 12/16 flu >> NEG  ANTIBIOTICS: 12/16 Aztreonam (pneumonia, PCN allergy) >>>12/19 12/16 Vancomycin (pneumonia, community MRSA) >>>12/18 12/16 Levaquin (pneumonia, atypical) >> 12/23 (stop date ordered)  SIGNIFICANT EVENTS:  12/16  Admitted with pneumonia / pulmonary edema, afib/RVR, impending respiratory failure 12/17- improved resp status, fib rvr 12/18- rising cardizem needs, tachy 12/19- neg balance, better controlled HR  12/20 TEE: Normal LVEF, dilated LA, no thrombus 12/20: successful DCCV  CONSULTANTS:  SEHV CODE STATUS:  Full DIET:  heart DVT Px:  lovenox/coumadin GI Px:  Not indicated  Subjective: denies any complaints today, breathing improving   INTAKE / OUTPUT: Intake/Output      12/21 0701 - 12/22 0700 12/22 0701 - 12/23 0700   P.O. 480    I.V. (mL/kg) 208.5 (3.1)    Total Intake(mL/kg) 688.5 (10.3)    Urine (mL/kg/hr) 380 (0.2)    Total Output 380    Net +308.5         Urine Occurrence 3 x     PHYSICAL EXAMINATION: Gen: no increased wob, appears good Neuro:  Awake, alert, intact HEENT:  PERRL jvd wnl Cardiovascular: S1 S2 regular rhythm Lungs:  R>L basilar rales Abdomen:  Soft, nontender, bowel sounds present Musculoskeletal:  no edema  LABS:  Lab 05/23/12 0530 05/22/12 1144 05/22/12 0625 05/21/12 1040 05/21/12 0405 05/20/12 0108 05/19/12 0415 05/18/12 1046 05/18/12 0425 05/17/12 2348 05/17/12  2213 05/17/12 2056 05/17/12 1648 05/17/12 1615  HGB 11.4* -- 11.8* -- 12.2 -- -- -- -- -- -- -- -- --  WBC 13.9* -- 15.4* -- 17.2* -- -- -- -- -- -- -- -- --  PLT 401* -- 397 -- 393 -- -- -- -- -- -- -- -- --  NA 137 -- 137 137 -- -- -- -- -- -- -- -- -- --  K 3.9 -- 3.9 -- -- -- -- -- -- -- -- -- -- --  CL 101 -- 100 97 -- -- -- -- -- -- -- -- -- --  CO2 25 -- 26 26 -- -- -- -- -- -- -- -- -- --  GLUCOSE 108* -- 115* 127* -- -- -- -- -- -- -- -- -- --  BUN 12 -- 15 12 -- -- -- -- -- -- -- -- -- --  CREATININE 0.62 -- 0.59 0.61 -- -- -- -- -- -- -- -- -- --  CALCIUM 9.1 -- 8.9 8.6 -- -- -- -- -- -- -- -- -- --  MG -- -- -- -- 2.2 1.6 -- -- 1.6 -- -- -- -- --  PHOS -- -- -- -- 2.9 4.6 -- -- 2.4 -- -- -- -- --  AST -- -- -- -- -- -- 111* -- -- -- -- -- -- --  ALT -- -- -- -- -- -- 98* -- -- -- -- -- -- --  ALKPHOS -- -- -- -- -- --  100 -- -- -- -- -- -- --  BILITOT -- -- -- -- -- -- 0.3 -- -- -- -- -- -- --  PROT -- -- -- -- -- -- 6.5 -- -- -- -- -- -- --  ALBUMIN -- -- -- -- -- -- 2.4* -- -- -- -- -- -- --  APTT -- -- -- -- -- -- -- -- -- -- -- -- -- --  INR 1.11 1.18 -- -- -- -- -- -- -- -- -- -- -- --  LATICACIDVEN -- -- -- -- -- -- -- -- -- -- -- -- 1.61 --  TROPONINI -- -- -- -- -- -- -- <0.30 <0.30 -- <0.30 -- -- --  PROCALCITON -- -- -- -- -- -- 0.67 -- 0.34 -- -- 0.37 -- --  PROBNP -- -- -- -- -- -- -- -- -- -- -- -- -- 1688.0*  O2SATVEN -- -- -- -- -- -- -- -- -- -- -- -- -- --  PHART -- -- -- -- -- -- -- -- -- 7.539* -- -- -- --  PCO2ART -- -- -- -- -- -- -- -- -- 31.5* -- -- -- --  PO2ART -- -- -- -- -- -- -- -- -- 66.0* -- -- -- --    Lab 05/18/12 0018  GLUCAP 133*    IMAGING: 12/19  Alveolar / interstitial airspace disease, again improved aeration after neg 600  ECG:  AF with CVR on tele  DIAGNOSES: Principal Problem: 1. Acute Respiratory failure with hypoxia: Improved, secondary to pulm edema/afib RVR  ?pneumonia  2.  PAF with RVR 1st episode since 01/2010,   S/p TEE CV 05/21/12 Rate controlled, in NSR On amiodarone, lovenox/coumadin per cardiology INR FU at [facility]  3. Community acquired pneumonia Clinically improving, WBC down Levaquin Po till 12/23  4. Hypokalemia: improved, replaced   Ambulate, Pt/Ot  DVt proph: lovenox/coumadin FULL CODE Disposition: home soon  Zannie Cove, MD [phone]

## 2012-05-23 NOTE — Progress Notes (Signed)
ANTICOAGULATION CONSULT NOTE - Follow Up Consult Pharmacy Consult for Lovenox/Warfarin Indication: atrial fibrillation  Allergies  Allergen Reactions  . Amoxicillin     Can't remember reaction  . Doxycycline     Can't remember reaction  . Penicillins     Can't remember reaction  . Sulfa Antibiotics     Can't remember reaction    Patient Measurements: Height: 5' (152.4 cm) Weight: 147 lb 9.6 oz (66.951 kg) (I think previous weight was meant to be measured in LBS.) IBW/kg (Calculated) : 45.5   Vital Signs: Temp: 97.7 [name] (36.5 C) (12/22 0600) BP: 144/Regina mmHg (12/22 0600) Pulse Rate: 75  (12/22 0600)  Labs:  Basename 05/23/12 0530 05/22/12 1144 05/22/12 0625 05/21/12 1417 05/21/12 1040 05/21/12 0405  HGB 11.4* -- 11.8* -- -- --  HCT 34.9* -- 36.0 -- -- 36.8  PLT 401* -- 397 -- -- 393  APTT -- -- -- -- -- --  LABPROT 14.2 14.8 -- -- -- --  INR 1.11 1.18 -- -- -- --  HEPARINUNFRC -- -- 0.60 0.49 -- 0.16*  CREATININE 0.62 -- 0.59 -- 0.61 --  CKTOTAL -- -- -- -- -- --  CKMB -- -- -- -- -- --  TROPONINI -- -- -- -- -- --    Estimated Creatinine Clearance: 49.5 ml/min (by C-G formula based on Cr of 0.62).   Medications:  Scheduled:    . albuterol  2.5 mg Nebulization Q6H  . amiodarone  400 mg Oral BID  . amiodarone  400 mg Oral Daily  . antiseptic oral rinse  15 mL Mouth Rinse BID  . aspirin EC  81 mg Oral Daily  . atorvastatin  10 mg Oral q1800  . [COMPLETED] coumadin book   Does not apply Once  . [COMPLETED] enoxaparin (LOVENOX) injection  65 mg Subcutaneous Once  . enoxaparin (LOVENOX) injection  65 mg Subcutaneous Q12H  . ipratropium  0.5 mg Nebulization Q6H  . levofloxacin  500 mg Oral Daily  . senna-docusate  1 tablet Oral BID  . [COMPLETED] warfarin  2.5 mg Oral ONCE-1800  . warfarin   Does not apply Once  . Warfarin - Pharmacist Dosing Inpatient   Does not apply q1800  . [DISCONTINUED] clopidogrel  75 mg Oral Daily  . [DISCONTINUED] diltiazem  60 mg Oral  Q6H    Assessment: [name] with h/o of afib now s/p DCCV/TEE on 12/20. Transitioned off heparin infusion on 12/21 to Lovenox/Warfarin bridge. CBC stable this AM, no overt bleeding noted. INR this AM is 1.11 (from 1.18), pt noted on amiodarone and finishing course of levaquin. Will dose conservatively given amiodarone. Scr 0.62, CrCl ~ 50.    Goal of Therapy:  INR 2-3 Monitor platelets by anticoagulation protocol: Yes   Plan:  - Continue Lovenox 65 mg [place] q12h - Warfarin 2.5mg  PO x 1 at 1800 - Daily PT/INR - Minimum q72h CBC - Monitor closely for bleeding  Abran Duke, PharmD Clinical Pharmacist Phone: [phone] Pager: [phone] 05/23/2012 8:16 AM

## 2012-05-23 NOTE — Progress Notes (Signed)
Subjective:  Nearly flat in bed. She looks weak, " I have felt better"  Objective:  Vital Signs in the last 24 hours: Temp:  [97.6 F (36.4 C)-98.7 F (37.1 C)] 97.7 F (36.5 C) (12/22 0600) Pulse Rate:  [63-91] 75  (12/22 0600) Resp:  [20-25] 20  (12/22 0600) BP: (110-144)/(63-79) 144/76 mmHg (12/22 0600) SpO2:  [92 %-97 %] 94 % (12/22 0804) Weight:  [66.951 kg (147 lb 9.6 oz)-149 kg (328 lb 7.8 oz)] 66.951 kg (147 lb 9.6 oz) (12/22 0600)  Intake/Output from previous day:  Intake/Output Summary (Last 24 hours) at 05/23/12 0956 Last data filed at 05/22/12 1200  Gross per 24 hour  Intake  365.1 ml  Output    200 ml  Net  165.1 ml    Physical Exam: General appearance: alert, cooperative, no distress and weak, pale Lungs: clear to auscultation bilaterally Heart: regular rate and rhythm   Rate: 76  Rhythm: normal sinus rhythm  Lab Results:  Basename 05/23/12 0530 05/22/12 0625  WBC 13.9* 15.4*  HGB 11.4* 11.8*  PLT 401* 397    Basename 05/23/12 0530 05/22/12 0625  NA 137 137  K 3.9 3.9  CL 101 100  CO2 25 26  GLUCOSE 108* 115*  BUN 12 15  CREATININE 0.62 0.59    Basename 05/23/12 0530  INR 1.11   Imaging: Imaging results have been reviewed -- no evidence of pulmonary edema.  Cardiac Studies: NSR on Tele.  Assessment/Plan:   Principal Problem:  *Respiratory failure with hypoxia  Active Problems:  PAF with RVR 1st episode since 01/2010, TEE CV 05/21/12  Acute diastolic congestive heart failure  CAP (community acquired pneumonia)  Elevated LFTs  Hypokalemia  Pulmonary fibrosis  Plan- Amio ordered, she is on Lovenox to Coumadin.  Corine Shelter PA-C 05/23/2012, 9:56 AM  I have seen and evaluated the patient this morning along with Corine Shelter, PA. I agree with his findings, examination as well as impression recommendations.  She is maintaining NSR on Amiodarone post DCCV. -- anticipate short term of Amiodarone while recovering from PNA.   Lovenox -  Coumadin bridge (consider initiating OP teaching, could be d/c'd to continue bridge as OP. No further evidence of CHF once cardioverted -- did receive Diuretics while in ICU. Will recheck proBNP in AM.  As BP stabilizes, will re-initiate ACE-I therapy @ 5 mg; can defer Diltiazem for now, while on Amiodarone. She does seem down -- says family always questions her about: "are you on your medications".  Had been on an antidepressant medicine at one time.  Marykay Lex, M.D., M.S. [facility] [address] [place], [place]  [address]  [phone] Pager # [phone] 05/23/2012 10:26 AM

## 2012-05-24 ENCOUNTER — Encounter (HOSPITAL_COMMUNITY): Payer: Self-pay | Admitting: Internal Medicine

## 2012-05-24 LAB — CBC
MCV: 88 fL (ref 78.0–100.0)
Platelets: 418 10*3/uL — ABNORMAL HIGH (ref 150–400)
RBC: 3.91 MIL/uL (ref 3.87–5.11)
WBC: 13 10*3/uL — ABNORMAL HIGH (ref 4.0–10.5)

## 2012-05-24 LAB — PRO B NATRIURETIC PEPTIDE: Pro B Natriuretic peptide (BNP): 95.6 pg/mL (ref 0–450)

## 2012-05-24 MED ORDER — ENOXAPARIN SODIUM 80 MG/0.8ML ~~LOC~~ SOLN: 65.0000 mg | Freq: Two times a day (BID) | SUBCUTANEOUS | Status: DC

## 2012-05-24 MED ORDER — WARFARIN SODIUM 4 MG PO TABS
4.0000 mg | ORAL_TABLET | Freq: Once | ORAL | Status: DC
  Filled 2012-05-24: qty 1

## 2012-05-24 MED ORDER — WARFARIN SODIUM 5 MG PO TABS: 5.0000 mg | ORAL_TABLET | Freq: Every day | ORAL | Status: DC

## 2012-05-24 MED ORDER — LISINOPRIL 10 MG PO TABS: 5.0000 mg | ORAL_TABLET | Freq: Every day | ORAL | Status: DC

## 2012-05-24 MED ORDER — AMIODARONE HCL 400 MG PO TABS: 400.0000 mg | ORAL_TABLET | Freq: Two times a day (BID) | ORAL | Status: DC

## 2012-05-24 NOTE — Progress Notes (Signed)
Reviewed discharge instructions with patient, she stated her understanding but did not want to sign because she can not see due to macular degeneration.  Report called to Amy at [facility] in [place].  Colman Cater

## 2012-05-24 NOTE — Progress Notes (Signed)
Maintaining sinus rhythm. Plan for amiodarone taper as outlined in my TEE-Cardioversion note.  Consider re-starting ACE-I if bp tolerates. Probably close to SNF discharge. Recommend lovenox bridge until therapeutic on warfarin due to recent cardioversion. Will sign-off. Follow-up with Dr. Alanda Amass as an outpatient.  Chrystie Nose, MD, [facility] Attending Cardiologist [facility]

## 2012-05-24 NOTE — Plan of Care (Signed)
Problem: Discharge Progression Outcomes Goal: Independent ADLs or Home Health Care Outcome: Not Met (add Reason) Patient discharged to SNF for rehab

## 2012-05-24 NOTE — Progress Notes (Signed)
Clinical Social Worker met with pt at bedside; pt currently eating lunch.  Pt states, "I'm going to [facility]".  CSW inquired about transportation. Pt states she is unable to arrange transportation and would like to go by ambulance.  CSW reviewed with RN.  CSW phoned transportation.  CSW to sign off, please re consult if needed.  Angelia Mould, MSW, [place] [phone]

## 2012-05-24 NOTE — Progress Notes (Signed)
Clinical Social Worker staffed case during progression; pt ready for dc.  CSW updated Revonda Standard at [facility] are able to accept pt today.  CSW to continue to follow and assist as needed.    Angelia Mould, MSW, [place] [phone]

## 2012-05-24 NOTE — Progress Notes (Signed)
ANTICOAGULATION CONSULT NOTE - Follow Up Consult Pharmacy Consult for Lovenox/Warfarin Indication: atrial fibrillation  Allergies  Allergen Reactions  . Amoxicillin     Can't remember reaction  . Doxycycline     Can't remember reaction  . Penicillins     Can't remember reaction  . Sulfa Antibiotics     Can't remember reaction   Labs:  Basename 05/24/12 0600 05/23/12 0530 05/22/12 1144 05/22/12 0625 05/21/12 1417 05/21/12 1040  HGB 11.1* 11.4* -- -- -- --  HCT 34.4* 34.9* -- 36.0 -- --  PLT 418* 401* -- 397 -- --  APTT -- -- -- -- -- --  LABPROT 13.7 14.2 14.8 -- -- --  INR 1.06 1.11 1.18 -- -- --  HEPARINUNFRC -- -- -- 0.60 0.49 --  CREATININE -- 0.62 -- 0.59 -- 0.61  CKTOTAL -- -- -- -- -- --  CKMB -- -- -- -- -- --  TROPONINI -- -- -- -- -- --    Estimated Creatinine Clearance: 49.5 ml/min (by C-G formula based on Cr of 0.62).  Assessment: 78 y/o F with h/o of afib now s/p DCCV/TEE on 12/20. Transitioned off heparin infusion on 12/21 to Lovenox/Warfarin bridge. CBC stable this AM, no overt bleeding noted. INR this AM is 1.06  (from 1.11), pt noted on amiodarone and finishing course of levaquin.   Goal of Therapy:  INR 2-3 Monitor platelets by anticoagulation protocol: Yes   Plan:  - Continue Lovenox 65 mg [place] q12h - Warfarin 4 mg PO x 1 at 1800 - Daily PT/INR - Minimum q72h CBC - Monitor closely for bleeding  Thank you. Okey Regal, PharmD [phone]  05/24/2012 9:21 AM

## 2012-05-24 NOTE — Discharge Summary (Signed)
Physician Discharge Summary  Patient ID: [name] MRN: [id number] DOB/AGE: [birth year] 78 y.o.  Admit date: 05/17/2012 Discharge date: 05/24/2012  Primary Care Physician:  No primary provider on file.  Cardiologist: Dr.Weintraub, [facility]  Disposition and Follow-up:  PCP in 1 week Dr.Weintraub in 1-2 weeks INR check 12/25, goal INR 2-3  Discharge Diagnoses:    Principal Problem:  *Respiratory failure with hypoxia Active Problems:  PAF with RVR 1st episode since 01/2010, TEE CV 05/21/12  Acute diastolic congestive heart failure  CAP (community acquired pneumonia)  Hypokalemia  Elevated LFTs  Pulmonary fibrosis      Medication List     As of 05/24/2012 11:23 AM    STOP taking these medications         clopidogrel 75 MG tablet   Commonly known as: PLAVIX      diltiazem 180 MG 24 hr capsule   Commonly known as: CARDIZEM CD      TAKE these medications         acetaminophen 500 MG tablet   Commonly known as: TYLENOL   Take 500 mg by mouth at bedtime as needed. For pain to help sleep      amiodarone 400 MG tablet   Commonly known as: PACERONE   Take 1 tablet (400 mg total) by mouth 2 (two) times daily. 400mg  Twice a day till 12/28 then 400mg  Daily      aspirin EC 81 MG tablet   Take 81 mg by mouth daily.      enoxaparin 80 MG/0.8ML injection   Commonly known as: LOVENOX   Inject 0.65 mLs (65 mg total) into the skin every 12 (twelve) hours. For 3-4 days, till INR >2      lisinopril 10 MG tablet   Commonly known as: PRINIVIL,ZESTRIL   Take 0.5 tablets (5 mg total) by mouth daily.      pravastatin 40 MG tablet   Commonly known as: PRAVACHOL   Take 40 mg by mouth daily.      warfarin 5 MG tablet   Commonly known as: COUMADIN   Take 1 tablet (5 mg total) by mouth daily. Take 5mg  12/23 and 12/24 then based on INR 12/25, for goal 2-3        Consults:  [facility]  Significant Diagnostic Studies:  Dg Chest [place] 1 View  05/23/2012   *RADIOLOGY REPORT*  Clinical Data: Shortness of breath.  Dyspnea.  Follow up atelectasis and/or pneumonia.  PORTABLE CHEST - 1 VIEW 05/23/2012 0557 hours:  Comparison: Portable chest x-rays 05/21/2012 dating back to 05/17/2012.  Two-view chest x-ray 02/01/2010.  Findings: Cardiac silhouette enlarged but stable.  Nodular interstitial pulmonary opacities diffusely, unchanged.  Focal airspace opacities in the lung bases, right greater than left, unchanged.  No new pulmonary parenchymal abnormalities.  IMPRESSION: Stable bibasilar pneumonia superimposed upon nodular interstitial lung disease (the interstitial disease was not present in 2011.)   Original Report Authenticated By: Hulan Saas, M.D.     Procedure: DC CArdioversion 12/20  Brief H and P: 78 yo with history of atrial fibrillation who presented to [facility] ED om 12/16 with dyspnea after being seen by her PCP. Seen one week ago with influenza symptoms and started on Tamiflu. Since then had increased respiratory symptoms, weakness and decreased fluid intake as well as fevers. Denies chest pain or pedal edema. In ED noted to be in atrial fibrillation with RVR and hypoxic.   Hospital Course:  1. Acute Respiratory failure with hypoxia:  Improved, secondary to pulm edema/afib RVR ?pneumonia  Oxygen requirements weaned down  2. PAF with RVR 1st episode since 01/2010,  S/p TEE CV 05/21/12  Initially treated with cardizem drip, then cardioverted 12/20 per Dr.Hilty,  Remained rate controlled in NSR since then Started on Amiodarone 12/21, being discharged on 400mg  BID for 1 week followed by 400mg  daily per Dr.Hilty's recommendations, also started on anticoagulation, s/p 2 days of bridging with lovenox, today is day 3, will need atleast 3-4 days of lovenox, till INR >2, then lovenox should be stopped and coumadin continued. Next INR check at SNF should be 12/25 FU with Dr.Weintraub in 1-2 weeks  3. Community acquired pneumonia  Clinically improving,  WBC down  Was initially treated with IV levaquin then transitioned to PO Completed 7 day course of abx and hence stopped  4. Hypokalemia: improved, replaced      Time spent on Discharge:  Signed: Darcy Barbara [facility]  05/24/2012, 11:23 AM

## 2012-05-24 NOTE — Care Management Note (Signed)
    Page 1 of 1   05/24/2012     4:47:48 PM   CARE MANAGEMENT NOTE 05/24/2012  Patient:  [name],[name]   Account Number:  [id number]  Date Initiated:  05/18/2012  Documentation initiated by:  Avie Arenas  Subjective/Objective Assessment:   Sob - Afib with RVR - started on cardizem drip.  Lives alone.     Action/Plan:   Anticipated DC Date:  05/24/2012   Anticipated DC Plan:  SKILLED NURSING FACILITY  In-house referral  Clinical Social Worker      DC Planning Services  CM consult      Choice offered to / List presented to:             Status of service:  Completed, signed off Medicare Important Message given?   (If response is "NO", the following Medicare IM given date fields will be blank) Date Medicare IM given:   Date Additional Medicare IM given:    Discharge Disposition:  SKILLED NURSING FACILITY  Per UR Regulation:  Reviewed for med. necessity/level of care/duration of stay  If discussed at Long Length of Stay Meetings, dates discussed:    Comments:  05/24/12- 1600- Donn Pierini RN, BSN [phone] Pt discharged to SNF today, CSW followed for placement needs.   05-18-12 1:05pm Avie Arenas, RNBSN  [phone] Lives alone, husband in [facility] at this time.  Patient does not want to go to SNF.  States sister is coming to visit once discharged.

## 2012-05-24 NOTE — Progress Notes (Signed)
Physical Therapy Treatment Patient Details Name: [name] MRN: [id number] DOB: November 01, 1931 Today's Date: 05/24/2012 Time: 4098-1191 PT Time Calculation (min): 15 min  PT Assessment / Plan / Recommendation Comments on Treatment Session  78 y.o. female admitted to [facility] with SOB/PNA and flu-like symptoms.  She presents today needing much less assistance with her mobility and is walking further than last session.  She continues to need O2 for activity as her sats drop into the mid 80s.  She is generally deconditioned and is still appropriate for SNF for rehab at discharge.      Follow Up Recommendations  SNF     Does the patient have the potential to tolerate intense rehabilitation   NA  Barriers to Discharge  none      Equipment Recommendations  None recommended by PT    Recommendations for Other Services  none  Frequency Min 2X/week   Plan Discharge plan needs to be updated;Frequency needs to be updated    Precautions / Restrictions Precautions Precautions: Fall Precaution Comments: monitor O2 sats, bring 2L O2   Pertinent Vitals/Pain No reports of pain, O2 sats on RA at rest 97%, O2 sats with gait: 84% on RA, re-applied 2 L O2 [place] during gait and O2 sats returned to 93%.      Mobility  Bed Mobility Supine to Sit: 5: Supervision;With rails;HOB elevated Sitting - Scoot to Edge of Bed: 5: Supervision;With rail Details for Bed Mobility Assistance: supervision for safety due to weakness and SOB/DOE Transfers Sit to Stand: 5: Supervision;With upper extremity assist;From elevated surface;From bed Stand to Sit: 5: Supervision;With upper extremity assist;To chair/3-in-1 (to gurney) Details for Transfer Assistance: supervision for safety due to weakness Ambulation/Gait Ambulation/Gait Assistance: 4: Min guard Ambulation Distance (Feet): 100 Feet Assistive device: Rolling walker Ambulation/Gait Assistance Details: slow, shaky gait pattern with increased DOE to 3/4 and decreased  O2 sats to 84% on RA-re applied O2 (2L O2NC) to nose during walk and sats increased to 93% on 2L.   Gait Pattern: Step-through pattern;Shuffle;Trunk flexed Gait velocity: less than 1.8 ft/sec which indicates a risk for recurrent falls.     Exercises  none-session cut short due to transporters arrived to take her to SNF    PT Goals Acute Rehab PT Goals PT Goal: Supine/Side to Sit - Progress: Met PT Goal: Sit to Stand - Progress: Met PT Goal: Ambulate - Progress: Progressing toward goal  Visit Information  Last PT Received On: 05/24/12 Assistance Needed: +1    Subjective Data  Subjective: Pt reports she is feeling stronger, but is going to go to [facility] for rehab before going home.  She is looking forward to going to SNF because she will get to see her dog there.   Patient Stated Goal: home when she is stronger         End of Session PT - End of Session Equipment Utilized During Treatment: Oxygen Activity Tolerance: Patient limited by fatigue Patient left: in bed;Other (comment) (with transporters on gurney to go to SNF)    Lurena Joiner B. Emmah Bratcher, PT, DPT [phone]   05/24/2012, 1:43 PM

## 2012-07-07 HISTORY — PX: PACEMAKER INSERTION: SHX728

## 2012-08-18 ENCOUNTER — Ambulatory Visit (INDEPENDENT_AMBULATORY_CARE_PROVIDER_SITE_OTHER): Payer: Medicare Other | Admitting: Ophthalmology

## 2012-08-18 DIAGNOSIS — H43819 Vitreous degeneration, unspecified eye: Secondary | ICD-10-CM

## 2012-09-18 ENCOUNTER — Encounter: Payer: Self-pay | Admitting: Pharmacist Clinician (PhC)/ Clinical Pharmacy Specialist

## 2012-09-18 DIAGNOSIS — I480 Paroxysmal atrial fibrillation: Secondary | ICD-10-CM

## 2012-09-18 DIAGNOSIS — Z7901 Long term (current) use of anticoagulants: Secondary | ICD-10-CM

## 2012-10-07 ENCOUNTER — Other Ambulatory Visit (HOSPITAL_COMMUNITY): Payer: Self-pay | Admitting: Cardiovascular Disease

## 2012-10-07 DIAGNOSIS — R0989 Other specified symptoms and signs involving the circulatory and respiratory systems: Secondary | ICD-10-CM

## 2012-10-08 ENCOUNTER — Ambulatory Visit (INDEPENDENT_AMBULATORY_CARE_PROVIDER_SITE_OTHER): Payer: Medicare Other | Admitting: Ophthalmology

## 2012-10-22 ENCOUNTER — Encounter (HOSPITAL_COMMUNITY): Payer: Medicare Other

## 2012-11-02 ENCOUNTER — Ambulatory Visit: Payer: Medicare Other | Admitting: Pharmacist Clinician (PhC)/ Clinical Pharmacy Specialist

## 2012-11-02 ENCOUNTER — Ambulatory Visit (HOSPITAL_COMMUNITY)
Admission: RE | Admit: 2012-11-02 | Discharge: 2012-11-02 | Disposition: A | Discharge status: Home / Self Care | Payer: Medicare Other | Source: Ambulatory Visit | Attending: Cardiovascular Disease | Admitting: Cardiovascular Disease

## 2012-11-02 DIAGNOSIS — R0989 Other specified symptoms and signs involving the circulatory and respiratory systems: Secondary | ICD-10-CM

## 2012-11-02 DIAGNOSIS — I6529 Occlusion and stenosis of unspecified carotid artery: Secondary | ICD-10-CM

## 2012-11-02 NOTE — Progress Notes (Signed)
Carotid Duplex Completed. Zyaire Dumas, RDMS, RVT  

## 2013-01-31 ENCOUNTER — Inpatient Hospital Stay (HOSPITAL_COMMUNITY)
Admission: EM | Admit: 2013-01-31 | Discharge: 2013-02-04 | DRG: 243 | Disposition: A | Discharge status: Skilled Nursing Facility | Payer: Medicare Other | Attending: Internal Medicine | Admitting: Internal Medicine

## 2013-01-31 ENCOUNTER — Encounter (HOSPITAL_COMMUNITY): Payer: Self-pay | Admitting: *Deleted

## 2013-01-31 ENCOUNTER — Emergency Department (HOSPITAL_COMMUNITY): Payer: Medicare Other

## 2013-01-31 DIAGNOSIS — J8410 Pulmonary fibrosis, unspecified: Secondary | ICD-10-CM | POA: Diagnosis present

## 2013-01-31 DIAGNOSIS — I495 Sick sinus syndrome: Secondary | ICD-10-CM | POA: Diagnosis present

## 2013-01-31 DIAGNOSIS — I5032 Chronic diastolic (congestive) heart failure: Secondary | ICD-10-CM | POA: Diagnosis present

## 2013-01-31 DIAGNOSIS — R531 Weakness: Secondary | ICD-10-CM

## 2013-01-31 DIAGNOSIS — E7800 Pure hypercholesterolemia, unspecified: Secondary | ICD-10-CM | POA: Diagnosis present

## 2013-01-31 DIAGNOSIS — J4489 Other specified chronic obstructive pulmonary disease: Secondary | ICD-10-CM | POA: Diagnosis present

## 2013-01-31 DIAGNOSIS — I509 Heart failure, unspecified: Secondary | ICD-10-CM | POA: Diagnosis present

## 2013-01-31 DIAGNOSIS — I440 Atrioventricular block, first degree: Secondary | ICD-10-CM | POA: Diagnosis present

## 2013-01-31 DIAGNOSIS — F3289 Other specified depressive episodes: Secondary | ICD-10-CM | POA: Diagnosis present

## 2013-01-31 DIAGNOSIS — R9431 Abnormal electrocardiogram [ECG] [EKG]: Secondary | ICD-10-CM

## 2013-01-31 DIAGNOSIS — IMO0002 Reserved for concepts with insufficient information to code with codable children: Secondary | ICD-10-CM | POA: Diagnosis not present

## 2013-01-31 DIAGNOSIS — I4891 Unspecified atrial fibrillation: Secondary | ICD-10-CM | POA: Diagnosis present

## 2013-01-31 DIAGNOSIS — Z882 Allergy status to sulfonamides status: Secondary | ICD-10-CM

## 2013-01-31 DIAGNOSIS — F329 Major depressive disorder, single episode, unspecified: Secondary | ICD-10-CM | POA: Diagnosis present

## 2013-01-31 DIAGNOSIS — Z881 Allergy status to other antibiotic agents status: Secondary | ICD-10-CM

## 2013-01-31 DIAGNOSIS — Z8249 Family history of ischemic heart disease and other diseases of the circulatory system: Secondary | ICD-10-CM

## 2013-01-31 DIAGNOSIS — R5381 Other malaise: Secondary | ICD-10-CM | POA: Diagnosis present

## 2013-01-31 DIAGNOSIS — J449 Chronic obstructive pulmonary disease, unspecified: Secondary | ICD-10-CM | POA: Diagnosis present

## 2013-01-31 DIAGNOSIS — Z950 Presence of cardiac pacemaker: Secondary | ICD-10-CM

## 2013-01-31 DIAGNOSIS — I480 Paroxysmal atrial fibrillation: Secondary | ICD-10-CM

## 2013-01-31 DIAGNOSIS — Z7901 Long term (current) use of anticoagulants: Secondary | ICD-10-CM

## 2013-01-31 DIAGNOSIS — I959 Hypotension, unspecified: Secondary | ICD-10-CM | POA: Diagnosis not present

## 2013-01-31 DIAGNOSIS — Z602 Problems related to living alone: Secondary | ICD-10-CM

## 2013-01-31 DIAGNOSIS — Z880 Allergy status to penicillin: Secondary | ICD-10-CM

## 2013-01-31 DIAGNOSIS — E876 Hypokalemia: Secondary | ICD-10-CM | POA: Diagnosis present

## 2013-01-31 DIAGNOSIS — R350 Frequency of micturition: Secondary | ICD-10-CM | POA: Diagnosis present

## 2013-01-31 DIAGNOSIS — R001 Bradycardia, unspecified: Secondary | ICD-10-CM | POA: Diagnosis present

## 2013-01-31 DIAGNOSIS — I5031 Acute diastolic (congestive) heart failure: Secondary | ICD-10-CM

## 2013-01-31 DIAGNOSIS — I10 Essential (primary) hypertension: Secondary | ICD-10-CM | POA: Diagnosis present

## 2013-01-31 DIAGNOSIS — N390 Urinary tract infection, site not specified: Secondary | ICD-10-CM | POA: Diagnosis present

## 2013-01-31 DIAGNOSIS — K5900 Constipation, unspecified: Secondary | ICD-10-CM | POA: Diagnosis present

## 2013-01-31 DIAGNOSIS — R339 Retention of urine, unspecified: Secondary | ICD-10-CM | POA: Diagnosis not present

## 2013-01-31 DIAGNOSIS — Z79899 Other long term (current) drug therapy: Secondary | ICD-10-CM

## 2013-01-31 HISTORY — DX: Presence of cardiac pacemaker: Z95.0

## 2013-01-31 HISTORY — DX: Unspecified intestinal obstruction, unspecified as to partial versus complete obstruction: K56.609

## 2013-01-31 LAB — COMPREHENSIVE METABOLIC PANEL
ALT: 23 U/L (ref 0–35)
Alkaline Phosphatase: 49 U/L (ref 39–117)
CO2: 30 mEq/L (ref 19–32)
GFR calc Af Amer: 70 mL/min — ABNORMAL LOW (ref >90)
GFR calc non Af Amer: 60 mL/min — ABNORMAL LOW (ref >90)
Glucose, Bld: 103 mg/dL — ABNORMAL HIGH (ref 70–99)
Potassium: 3 mEq/L — ABNORMAL LOW (ref 3.5–5.1)
Sodium: 140 mEq/L (ref 135–145)

## 2013-01-31 LAB — URINALYSIS W MICROSCOPIC + REFLEX CULTURE
Bilirubin Urine: NEGATIVE
Ketones, ur: NEGATIVE mg/dL
Nitrite: NEGATIVE
Urobilinogen, UA: 1 mg/dL (ref 0.0–1.0)
pH: 7.5 (ref 5.0–8.0)

## 2013-01-31 LAB — CBC WITH DIFFERENTIAL/PLATELET
Lymphocytes Relative: 17 % (ref 12–46)
Lymphs Abs: 1.1 10*3/uL (ref 0.7–4.0)
MCV: 87.7 fL (ref 78.0–100.0)
Neutrophils Relative %: 71 % (ref 43–77)
Platelets: 262 10*3/uL (ref 150–400)
RBC: 4.87 MIL/uL (ref 3.87–5.11)
WBC: 6.8 10*3/uL (ref 4.0–10.5)

## 2013-01-31 LAB — BASIC METABOLIC PANEL
BUN: 11 mg/dL (ref 6–23)
CO2: 27 mEq/L (ref 19–32)
Calcium: 8.7 mg/dL (ref 8.4–10.5)
Creatinine, Ser: 0.78 mg/dL (ref 0.50–1.10)
Glucose, Bld: 69 mg/dL — ABNORMAL LOW (ref 70–99)

## 2013-01-31 LAB — PROTIME-INR: Prothrombin Time: 16.9 seconds — ABNORMAL HIGH (ref 11.6–15.2)

## 2013-01-31 LAB — GLUCOSE, CAPILLARY: Glucose-Capillary: 103 mg/dL — ABNORMAL HIGH (ref 70–99)

## 2013-01-31 LAB — CG4 I-STAT (LACTIC ACID): Lactic Acid, Venous: 0.97 mmol/L (ref 0.5–2.2)

## 2013-01-31 LAB — TROPONIN I: Troponin I: <0.3 ng/mL (ref <0.30)

## 2013-01-31 MED ORDER — LOSARTAN POTASSIUM 50 MG PO TABS
50.0000 mg | ORAL_TABLET | Freq: Every day | ORAL | Status: DC
  Administered 2013-01-31 – 2013-02-04 (×5): 50 mg via ORAL
  Filled 2013-01-31 (×5): qty 1

## 2013-01-31 MED ORDER — ATROPINE SULFATE 1 MG/ML IJ SOLN
INTRAMUSCULAR | Status: AC
  Filled 2013-01-31: qty 1

## 2013-01-31 MED ORDER — SODIUM CHLORIDE 0.9 % IV BOLUS (SEPSIS)
500.0000 mL | Freq: Once | INTRAVENOUS | Status: AC
  Administered 2013-01-31: 500 mL via INTRAVENOUS

## 2013-01-31 MED ORDER — WARFARIN SODIUM 4 MG PO TABS
4.0000 mg | ORAL_TABLET | Freq: Once | ORAL | Status: AC
  Administered 2013-01-31: 4 mg via ORAL
  Filled 2013-01-31: qty 1

## 2013-01-31 MED ORDER — HYDRALAZINE HCL 20 MG/ML IJ SOLN: 5.0000 mg | Freq: Four times a day (QID) | INTRAMUSCULAR | Status: DC | PRN

## 2013-01-31 MED ORDER — ASPIRIN EC 81 MG PO TBEC
81.0000 mg | DELAYED_RELEASE_TABLET | Freq: Every day | ORAL | Status: DC
  Administered 2013-01-31 – 2013-02-04 (×5): 81 mg via ORAL
  Filled 2013-01-31 (×5): qty 1

## 2013-01-31 MED ORDER — POTASSIUM CHLORIDE CRYS ER 20 MEQ PO TBCR
40.0000 meq | EXTENDED_RELEASE_TABLET | Freq: Once | ORAL | Status: AC
  Administered 2013-01-31: 40 meq via ORAL
  Filled 2013-01-31: qty 2

## 2013-01-31 MED ORDER — SODIUM CHLORIDE 0.9 % IV SOLN
Freq: Once | INTRAVENOUS | Status: AC
  Administered 2013-01-31: 100 mL/h via INTRAVENOUS

## 2013-01-31 MED ORDER — WARFARIN - PHARMACIST DOSING INPATIENT: Freq: Every day | Status: DC

## 2013-01-31 MED ORDER — AMIODARONE HCL 200 MG PO TABS
200.0000 mg | ORAL_TABLET | Freq: Every day | ORAL | Status: DC
  Filled 2013-01-31: qty 1

## 2013-01-31 MED ORDER — POLYETHYLENE GLYCOL 3350 17 G PO PACK
17.0000 g | PACK | Freq: Every day | ORAL | Status: DC
  Administered 2013-01-31 – 2013-02-04 (×3): 17 g via ORAL
  Filled 2013-01-31 (×5): qty 1

## 2013-01-31 MED ORDER — SODIUM CHLORIDE 0.9 % IJ SOLN
3.0000 mL | Freq: Two times a day (BID) | INTRAMUSCULAR | Status: DC
  Administered 2013-01-31 – 2013-02-02 (×4): 3 mL via INTRAVENOUS
  Administered 2013-02-02: 10:00:00 via INTRAVENOUS
  Administered 2013-02-03: 3 mL via INTRAVENOUS

## 2013-01-31 MED ORDER — DOCUSATE SODIUM 100 MG PO CAPS
100.0000 mg | ORAL_CAPSULE | Freq: Two times a day (BID) | ORAL | Status: DC
  Administered 2013-01-31 – 2013-02-04 (×6): 100 mg via ORAL
  Filled 2013-01-31 (×9): qty 1

## 2013-01-31 MED ORDER — FUROSEMIDE 40 MG PO TABS
40.0000 mg | ORAL_TABLET | Freq: Every day | ORAL | Status: DC
  Administered 2013-01-31 – 2013-02-04 (×5): 40 mg via ORAL
  Filled 2013-01-31 (×5): qty 1

## 2013-01-31 MED ORDER — HEPARIN SODIUM (PORCINE) 5000 UNIT/ML IJ SOLN
5000.0000 [IU] | Freq: Three times a day (TID) | INTRAMUSCULAR | Status: DC
  Administered 2013-01-31 – 2013-02-04 (×9): 5000 [IU] via SUBCUTANEOUS
  Filled 2013-01-31 (×14): qty 1

## 2013-01-31 NOTE — Progress Notes (Signed)
Pt received from ED per stretcher. Oriented to room & safety policy. VS done placed on telemetry monitor. Released orders and called for Heart healthy  diet.

## 2013-01-31 NOTE — H&P (Signed)
Date: 01/31/2013               Patient Name:  [name] MRN: [id number]  DOB: May 03, 1932 Age / Sex: 79 y.o., female   PCP: [name], MD         Medical Service: Internal Medicine Teaching Service         Attending Physician: Dr. Jonah Blue, DO    First Contact: Dr. Darci Needle Pager: [phone]  Second Contact: Dr. Sherrine Maples Pager: [phone]       After Hours (After 5p/  First Contact Pager: [phone]  weekends / holidays): Second Contact Pager: [phone]   Chief Complaint: generalized weakness  History of Present Illness:  [name] is a 79yo WF with h/o A fib (on coumadin s/p cardioversion Dec 2013), diastolic CHF (last Echo Dec 2013 EF 55-60%), pulmonary fibrosis, HTN who presents with c/o generalized weakness and malaise that developed over the past few days. Patient reports that she had dysuria and increased urinary frequency that began 1 week ago and was seen by her PCP (Dr. Jeannetta Nap) last Thursday where she was prescribed a 10 day course of cipro, she is on day 5. PCP also prescribed remeron during this office visit to help with patient's sleep. Her husband is in a nursing home and is not doing well, so recently her mind has been racing at night, which inhibits sleep. She has been taking the remeron since Thursday. Patient reports generalized malaise for the past few days reporting "I feel like I am going to collapse." Denies CP, SOB, heart palpitations, cough, lightheadedness, dizziness, syncope or any LOC, N/V/D, abdominal pain, weight loss or gain, BLE edema. Her urinary symptoms have completely resolved. Her appetite has been good and per son, she has been eating and drinking normally.   In the ED, patient had CMP that was significant for K 3.0, CBC wnl, lactic acid wnl, troponin neg x 1, INR subtherapeutic at 1.4. UA was negative. CXR showed no acute process. EKG showed sinus bradycardia (59 bpm), QT prolongation (QTc 538), long PR interval (231), no ischemic changes noted.  IVF NS at 100cc/hr. ED gave her KDur .  PCP Dr. Jeannetta Nap ([place]), Cardiologist Dr. Alanda Amass  Meds: No current facility-administered medications for this encounter.   Current Outpatient Prescriptions  Medication Sig Dispense Refill  . acetaminophen (TYLENOL) 500 MG tablet Take 500 mg by mouth at bedtime as needed. For pain to help sleep      . amiodarone (PACERONE) 200 MG tablet Take 200 mg by mouth daily.      . calcium-vitamin D (OSCAL WITH D) 500-200 MG-UNIT per tablet Take 1 tablet by mouth.      . ciprofloxacin (CIPRO) 750 MG tablet Take 750 mg by mouth every 12 (twelve) hours.      Marland Kitchen FLUoxetine (PROZAC) 20 MG capsule Take 20 mg by mouth daily.      . furosemide (LASIX) 40 MG tablet Take 40 mg by mouth daily.      Marland Kitchen losartan (COZAAR) 50 MG tablet Take 50 mg by mouth daily.      . mirtazapine (REMERON) 15 MG tablet Take 15 mg by mouth at bedtime.      Marland Kitchen warfarin (COUMADIN) 5 MG tablet Take 2.5 mg by mouth See admin instructions. Take 1/2 tablet for three days then skip a day and repeat until INR is checked again next week.        Allergies: Allergies as of 01/31/2013 - Review Complete 01/31/2013  Allergen Reaction Noted  . Amoxicillin  05/17/2012  . Doxycycline  05/17/2012  . Penicillins  05/17/2012  . Sulfa antibiotics  05/17/2012   Past Medical History  Diagnosis Date  . Irregular heart beat   . Hypertension   . Hypercholesteremia   . Depression   . Atrial fibrillation   . Bowel obstruction    Past Surgical History  Procedure Laterality Date  . Tee without cardioversion  05/21/2012    Procedure: TRANSESOPHAGEAL ECHOCARDIOGRAM (TEE);  Surgeon: Chrystie Nose, MD;  Location: [facility] ENDOSCOPY;  Service: Cardiovascular;  Laterality: N/A;  . Cardioversion  05/21/2012    Procedure: CARDIOVERSION;  Surgeon: Chrystie Nose, MD;  Location: [facility] ENDOSCOPY;  Service: Cardiovascular;  Laterality: N/A;  . Abdominal surgery    . Tonsillectomy     No family history on  file. History   Social History  . Marital Status: Married    Spouse Name: N/A    Number of Children: N/A  . Years of Education: N/A   Occupational History  . Not on file.   Social History Main Topics  . Smoking status: Never Smoker   . Smokeless tobacco: Never Used  . Alcohol Use: No  . Drug Use: No  . Sexual Activity: Not on file   Other Topics Concern  . Not on file   Social History Narrative  . No narrative on file   Review of Systems: General: See HPI Skin: no rash HEENT: no blurry vision, hearing changes, sore throat Pulm: no dyspnea, coughing, wheezing CV: no chest pain, palpitations, shortness of breath Abd: no abdominal pain, nausea/vomiting, diarrhea GU: no dysuria, hematuria, polyuria Ext: no arthralgias, myalgias Neuro: no weakness, numbness, or tingling  Physical Exam: Blood pressure 144/62, pulse 48, temperature 98.4 F (36.9 C), temperature source Oral, resp. rate 16, SpO2 96.00%. Orthostatics- Sitting- BP 151/58 HR 49, Standing BP 140/67, HR 52  General: alert, cooperative, and in no apparent distress, but she appears fatigued HEENT: pupils equal round and reactive to light, vision grossly intact, no scleral icterus, oropharynx clear and non-erythematous, MMM Neck: supple, no cervical lymphadenopathy, JVD, or carotid bruits Lungs: clear to ascultation bilaterally, though decreased breath sounds to bilateral bases, normal work of respiration, no wheezes, rales, ronchi Heart: bradycardic to 50s, regular rhythm, no murmurs, gallops, or rubs Abdomen: soft, non-tender, non-distended, normal bowel sounds Extremities: warm extremities bilaterally, no BLE edema Neurologic: alert & oriented X3, cranial nerves II-XII intact, strength 5/5 throughout, sensation intact to light touch, cerebellar function intact, gait is slow, but normal  Lab results: Basic Metabolic Panel:  Recent Labs  53/66/44 1027  NA 140  K 3.0*  CL 101  CO2 30  GLUCOSE 103*  BUN 13    CREATININE 0.89  CALCIUM 9.0  MG 2.0   Liver Function Tests:  Recent Labs  01/31/13 1027  AST 23  ALT 23  ALKPHOS 49  BILITOT 0.4  PROT 6.7  ALBUMIN 3.7   CBC:  Recent Labs  01/31/13 1027  WBC 6.8  NEUTROABS 4.8  HGB 14.7  HCT 42.7  MCV 87.7  PLT 262   Cardiac Enzymes:  Recent Labs  01/31/13 1027  TROPONINI <0.30   CBG:  Recent Labs  01/31/13 1045  GLUCAP 103*   Coagulation:  Recent Labs  01/31/13 1027  LABPROT 16.9*  INR 1.41   Urinalysis:  Recent Labs  01/31/13 1215  COLORURINE YELLOW  LABSPEC 1.007  PHURINE 7.5  GLUCOSEU NEGATIVE  HGBUR NEGATIVE  BILIRUBINUR NEGATIVE  KETONESUR NEGATIVE  PROTEINUR NEGATIVE  UROBILINOGEN 1.0  NITRITE NEGATIVE  LEUKOCYTESUR NEGATIVE   Misc. Labs: Lactate:  .97  Imaging results:  Dg Chest 2 View  01/31/2013   *RADIOLOGY REPORT*  Clinical Data: Weakness.  Shortness of breath.  CHEST - 2 VIEW  Comparison: Chest x-ray 05/23/2012.  Findings: Lung volumes are normal.  No consolidative airspace disease.  No pleural effusions.  No pneumothorax.  No pulmonary nodule or mass noted.  Pulmonary vasculature and the cardiomediastinal silhouette are within normal limits. Atherosclerosis in the thoracic aorta.  Calcified granuloma in the right apex is unchanged.  IMPRESSION: 1. No radiographic evidence of acute cardiopulmonary disease. 2.  Atherosclerosis.   Original Report Authenticated By: Trudie Reed, M.D.    Other results: EKG:  sinus bradycardia (59 bpm), QT prolongation (QTc 538), long PR interval (231), no ischemic changes noted. Possible U waves in leads V2, V3.  Assessment & Plan by Problem: Mrs. Lisbon is a 79yo WF with h/o A fib, diastolic CHF, pulmonary fibrosis, HTN who presents with c/o generalized weakness and malaise.  # Generalized weakness: Patient reports worsening generalized weakness with no other symptoms or focal neurological signs for the past few days after starting Remeron last Thursday.   Patient is also on amiodarone, which can inhibit the metabolism of QT prolonging medications such as Remeron. Fatigue/drowsiness are known side effects of both Remeron and Amiodarone. Additionally, patient has long QT that appears to be new since her last EKG in December 2013. This is likely 2/2 the new addition of Remeron to her medication regimen in combination with her amiodarone. Long term use of amiodarone may cause thyroid dysfunction and patient does endorse having constipation as well as feeling cold "all the time," so hypothyroidism should be considered. Patient is not orthostatic on exam and she denies lightheadedness, so this is unlikely contributing to her malaise. Patient is bradycardic and this can also lead to generalized malaise. Malignancy can also cause generalized malaise, however, she has not had weight loss, night sweats, or other symptoms that would lead me to believe she has an occult malignancy.  - stop Remeron -TSH/Free T4 -cycle troponins -CBC and BMP tomorrow AM -replete lytes as needed- patient hypokalemic at 3.0, which was repleted with 40 of KDur in ED -discontinued cipro as patient's UA is clean and her symptoms have completely resolved -holding prozac  # Bradycardia: Unclear etiology at this time. Amiodarone can cause bradycardia, but since it does not appear that patient has had a recent increase in her amiodarone dose, this is unlikely the cause. Hypokalemia can also cause sinus bradycardia. EKG with long PR, but no dropped beats, which may represent 1st degree AV block.  -tele -consider cardiology consult -repeat EKG  # A fib: Patient is on amiodarone and warfarin with subtherapeutic INR on admission. Admission EKG shows sinus bradycardia. H/o cardioversion in Dec. 2013. -warfarin per pharmacy -continue amiodarone  -monitor PT/INR  # diastolic CHF: Patient does not appear volume overloaded.   - continue lasix and losartan  # HTN:  BP ranging from 92-156/60-80.  She is not orthostatic. - hydralazine 5mg  prn for   # VTE: SCDs, heparin  # Diet: heart  Code status: full  Dispo: Disposition is deferred at this time, awaiting improvement of current medical problems. Anticipated discharge in approximately 1 day(s).   The patient does have a current PCP Andrey Campanile Elizebeth Koller, MD) and does not need an [facility] hospital follow-up appointment after discharge.  The patient does not have  transportation limitations that hinder transportation to clinic appointments.  Signed: Windell Hummingbird, MD 01/31/2013, 3:29 PM

## 2013-01-31 NOTE — ED Notes (Signed)
Admit Doctor at bedside.  

## 2013-01-31 NOTE — Progress Notes (Signed)
ANTICOAGULATION CONSULT NOTE - Initial Consult  Pharmacy Consult for Coumadin Indication: atrial fibrillation  Allergies  Allergen Reactions  . Amoxicillin     Can't remember reaction  . Doxycycline     Can't remember reaction  . Penicillins     Can't remember reaction  . Sulfa Antibiotics     Can't remember reaction    Vital Signs: Temp: 98.4 F (36.9 C) (09/01 0954) Temp src: Oral (09/01 0954) BP: 144/57 mmHg (09/01 1700) Pulse Rate: 51 (09/01 1700)  Labs:  Recent Labs  01/31/13 1027  HGB 14.7  HCT 42.7  PLT 262  LABPROT 16.9*  INR 1.41  CREATININE 0.89  TROPONINI <0.30    The CrCl is unknown because both a height and weight (above a minimum accepted value) are required for this calculation.   Medical History: Past Medical History  Diagnosis Date  . Irregular heart beat   . Hypertension   . Hypercholesteremia   . Depression   . Atrial fibrillation   . Bowel obstruction     Medications:  See PTA medication list  Assessment: 79 yo female admitted with generalized weakness and malaise over the last few days. She was prescribed a 10 day course of Cipro for a UTI and is on day 3 according to the PTA medication list. She also recently started mirtazapine for sleep.  Pharmacy consulted to manage Coumadin for Afib. She is s/p DCCV in December. INR is subtherapeutic at 1.41. No bleeding noted, CBC is normal.  Home Coumadin dose is 2.5 mg daily for 3 days then skip a day and repeat. Son unsure if patient had dose last night.  Goal of Therapy:  INR 2-3 Monitor platelets by anticoagulation protocol: Yes   Plan:  - Coumadin 4 mg PO tonight - INR daily - Monitor for s/sx of bleeding   [facility], [address].D., BCPS Clinical Pharmacist Pager: [phone] 01/31/2013 5:26 PM

## 2013-01-31 NOTE — Progress Notes (Signed)
S: The night team was called by the nurse for bradycardia of 29. We elected to see the patient. The patient appears very lethargic. She states that she feels poorly. She denies SOB, chest pain, but admits lightheadedness when sitting up. It is unclear if this is an acute worsening of her condition since her admission earlier today.  O: HR 40 BP 113/68 Distant heart sounds. Bradycardic and irregular pulse. Lungs sounds CTAB. Review of Telemetry revealed numerous pauses, up to 3.2 seconds. EKG demonstrated questionable sinus with slow ventricular rate versus afib (questionable p waves in aVF).  A+P:  1. Bradycardia with normal blood pressure.  - F/U STAT Labs (BMP, Troponin) - Consulted Cardiology (Dr. Gala Romney) - Rec transferring patient to step down unit. - Leave AED pads in place, keep atropine at bedside - Ask nurse to page if patient decompensates or has sustained HR in the 30's - May consider pacing if becomes symptomatic or hemodynamic instability.  2. Lethargy - Likely 2/2 to recent Remeron, but could be a symptom of bradycardia. - Will continue to monitor overnight,

## 2013-01-31 NOTE — ED Notes (Addendum)
Pt c/o weakness for several weeks.  Pt is tearful and saying she feels like she is going to collapse.  Pt almost fell in triage.  Pt has been taking cipro since Fri to tx a uti, but states she is still experiencing burning on urination.  Denies chest pain or headaches.  Cardioverted in Dec.

## 2013-01-31 NOTE — ED Provider Notes (Signed)
CSN: [id number]     Arrival date & time 01/31/13  0941 History   First MD Initiated Contact with Patient 01/31/13 0945     Chief Complaint  Patient presents with  . Weakness  . Urinary Tract Infection   (Consider location/radiation/quality/duration/timing/severity/associated sxs/prior Treatment) HPI  Patient is a 79 year old female who presents to emergency department with complaint of generalized weakness. Patient states she has not been feeling well for about a week. States she feels weak, lightheaded. Patient states she went to her primary care Dr. 4 days ago and was diagnosed with a urinary tract infection. Patient is taking Cipro for her symptoms. States she continues to have dysuria. Patient denies any fever, chills. Patient states that this morning she was able to get but weakness with walking and states she felt like she was going to faint. Patient denies any pain anywhere. Patient denies any headache, neck pain or stiffness, chest pain, shortness of breath, abdominal pain. Patient states she was constipated several days ago but took MiraLax and had a normal bowel movement yesterday and today. Patient has no other complaints. Past Medical History  Diagnosis Date  . Irregular heart beat   . Hypertension   . Hypercholesteremia   . Depression   . Atrial fibrillation   . Bowel obstruction    Past Surgical History  Procedure Laterality Date  . Tee without cardioversion  05/21/2012    Procedure: TRANSESOPHAGEAL ECHOCARDIOGRAM (TEE);  Surgeon: Chrystie Nose, MD;  Location: [facility] ENDOSCOPY;  Service: Cardiovascular;  Laterality: N/A;  . Cardioversion  05/21/2012    Procedure: CARDIOVERSION;  Surgeon: Chrystie Nose, MD;  Location: [facility] ENDOSCOPY;  Service: Cardiovascular;  Laterality: N/A;  . Abdominal surgery    . Tonsillectomy     No family history on file. History  Substance Use Topics  . Smoking status: Never Smoker   . Smokeless tobacco: Never Used  . Alcohol Use: No   OB  History   Grav Para Term Preterm Abortions TAB SAB Ect Mult Living                 Review of Systems  Constitutional: Positive for appetite change and fatigue. Negative for fever and chills.  HENT: Negative for neck pain and neck stiffness.   Respiratory: Negative for cough, chest tightness and shortness of breath.   Cardiovascular: Negative for chest pain, palpitations and leg swelling.  Gastrointestinal: Negative for nausea, vomiting, abdominal pain and diarrhea.  Genitourinary: Positive for dysuria. Negative for urgency, hematuria, flank pain, vaginal pain and pelvic pain.  Musculoskeletal: Negative for myalgias and arthralgias.  Skin: Negative for rash.  Neurological: Positive for dizziness, weakness and light-headedness. Negative for headaches.  All other systems reviewed and are negative.    Allergies  Amoxicillin; Doxycycline; Penicillins; and Sulfa antibiotics  Home Medications   Current Outpatient Rx  Name  Route  Sig  Dispense  Refill  . acetaminophen (TYLENOL) 500 MG tablet   Oral   Take 500 mg by mouth at bedtime as needed. For pain to help sleep         . amiodarone (PACERONE) 400 MG tablet   Oral   Take 1 tablet (400 mg total) by mouth 2 (two) times daily. 400mg  Twice a day till 12/28 then 400mg  Daily   45 tablet   0   . aspirin EC 81 MG tablet   Oral   Take 81 mg by mouth daily.         Marland Kitchen enoxaparin (LOVENOX) 80  MG/0.8ML injection   Subcutaneous   Inject 0.65 mLs (65 mg total) into the skin every 12 (twelve) hours. For 3-4 days, till INR >2   22.4 mL   0   . lisinopril (PRINIVIL,ZESTRIL) 10 MG tablet   Oral   Take 0.5 tablets (5 mg total) by mouth daily.   30 tablet   0   . pravastatin (PRAVACHOL) 40 MG tablet   Oral   Take 40 mg by mouth daily.         Marland Kitchen warfarin (COUMADIN) 5 MG tablet   Oral   Take 1 tablet (5 mg total) by mouth daily. Take 5mg  12/23 and 12/24 then based on INR 12/25, for goal 2-3   15 tablet   0    BP 141/68   Pulse 62  Temp(Src) 98.4 F (36.9 C) (Oral)  Resp 18  SpO2 96% Physical Exam  Nursing note and vitals reviewed. Constitutional: She is oriented to person, place, and time. She appears well-developed and well-nourished.  Patient is tearful  HENT:  Head: Normocephalic and atraumatic.  Oral mucosa dry  Eyes: Conjunctivae and EOM are normal. Pupils are equal, round, and reactive to light.  Neck: Neck supple.  Cardiovascular: Normal rate, regular rhythm and normal heart sounds.   Pulmonary/Chest: Effort normal and breath sounds normal. No respiratory distress. She has no wheezes. She has no rales.  Abdominal: Soft. Bowel sounds are normal. She exhibits no distension. There is no tenderness. There is no rebound.  Musculoskeletal: She exhibits no edema.  Neurological: She is alert and oriented to person, place, and time. No cranial nerve deficit. Coordination normal.  5/5 and equal upper and lower extremity strength bilaterally. Equal grip strength bilaterally. Normal finger to nose and heel to shin. No pronator drift.   Skin: Skin is warm and dry.  Psychiatric: She has a normal mood and affect. Her behavior is normal.    ED Course  Procedures (including critical care time) Labs Review Labs Reviewed  COMPREHENSIVE METABOLIC PANEL - Abnormal; Notable for the following:    Potassium 3.0 (*)    Glucose, Bld 103 (*)    GFR calc non Af Amer 60 (*)    GFR calc Af Amer 70 (*)    All other components within normal limits  PROTIME-INR - Abnormal; Notable for the following:    Prothrombin Time 16.9 (*)    All other components within normal limits  GLUCOSE, CAPILLARY - Abnormal; Notable for the following:    Glucose-Capillary 103 (*)    All other components within normal limits  TROPONIN I  CBC WITH DIFFERENTIAL  URINALYSIS W MICROSCOPIC + REFLEX CULTURE  MAGNESIUM  CG4 I-STAT (LACTIC ACID)   Imaging Review Dg Chest 2 View  01/31/2013   *RADIOLOGY REPORT*  Clinical Data: Weakness.   Shortness of breath.  CHEST - 2 VIEW  Comparison: Chest x-ray 05/23/2012.  Findings: Lung volumes are normal.  No consolidative airspace disease.  No pleural effusions.  No pneumothorax.  No pulmonary nodule or mass noted.  Pulmonary vasculature and the cardiomediastinal silhouette are within normal limits. Atherosclerosis in the thoracic aorta.  Calcified granuloma in the right apex is unchanged.  IMPRESSION: 1. No radiographic evidence of acute cardiopulmonary disease. 2.  Atherosclerosis.   Original Report Authenticated By: Trudie Reed, M.D.     Date: 01/31/2013  Rate: 59  Rhythm: sinus bradycardia  QRS Axis: normal  Intervals: PR prolonged and QT prolonged  ST/T Wave abnormalities: nonspecific ST/T changes  Conduction Disutrbances:left  anterior fascicular block  Narrative Interpretation:   Old EKG Reviewed: changes noted afib resolved, and new prolonged QT compared to 05/17/13    MDM   1. Weakness   2. Prolonged Q-T interval on ECG   3. Bradycardia   4. Hypokalemia      Patient with generalized weakness and malaise. She does not have any focal complaints or neuro deficits. She does not have any pain. She has history of A. fib with cardioversion 7 months ago. Today she's got sinus bradycardia on EKG with prolonged QT and PR waves. While in emergency department she has dropped her heart rate down into the 30s at one point but mostly heart rate remained in the 50s. Her labs except for her potassium are unremarkable. Potassium is 3. I replaced with 40 mEq of potassium by mouth. Her chest x-ray and UA are unremarkable. Given patient's extremely prolonged QT interval on EKG and bradycardia, will admit for obs. It is possible the patient's hypokalemia and starting on Cipro has caused this prolongation. He should is agreeable with the plan. I spoke with teaching service and they will admit patient  Filed Vitals:   01/31/13 1400 01/31/13 1415 01/31/13 1430 01/31/13 1533  BP: 134/78 150/68  144/62 156/67  Pulse: 48 48 48 48  Temp:      TempSrc:      Resp: 16 16 16 15   SpO2: 97% 96% 96% 96%       Lottie Mussel, PA-C 01/31/13 1601  Medical screening examination/treatment/procedure(s) were conducted as a shared visit with non-physician practitioner(s) or resident and myself. I personally evaluated the patient during the encounter and agree with the findings and plan unless otherwise indicated.  General weakness for one week. Recent cipro for UTI. No back pain or fevers.  EKG long QT, K low. Mg pending. Dehydrated clinically, fluids given. Treatment in ED.  Plan for tele observation. Teaching service consulted. CNs intact, equal strength bilateral, dry mm, RRR, no distress.  G weakness, long QT, hypoK   Enid Skeens, MD 01/31/13 [id number]

## 2013-02-01 ENCOUNTER — Encounter (HOSPITAL_COMMUNITY): Payer: Self-pay | Admitting: Cardiology

## 2013-02-01 DIAGNOSIS — R9431 Abnormal electrocardiogram [ECG] [EKG]: Secondary | ICD-10-CM

## 2013-02-01 DIAGNOSIS — I495 Sick sinus syndrome: Secondary | ICD-10-CM | POA: Diagnosis present

## 2013-02-01 DIAGNOSIS — R5381 Other malaise: Secondary | ICD-10-CM

## 2013-02-01 DIAGNOSIS — I509 Heart failure, unspecified: Secondary | ICD-10-CM

## 2013-02-01 DIAGNOSIS — I5031 Acute diastolic (congestive) heart failure: Secondary | ICD-10-CM

## 2013-02-01 DIAGNOSIS — I498 Other specified cardiac arrhythmias: Secondary | ICD-10-CM

## 2013-02-01 LAB — BASIC METABOLIC PANEL
CO2: 27 mEq/L (ref 19–32)
Calcium: 8.7 mg/dL (ref 8.4–10.5)
Chloride: 105 mEq/L (ref 96–112)
Sodium: 141 mEq/L (ref 135–145)

## 2013-02-01 LAB — PROTIME-INR: INR: 1.38 (ref 0.00–1.49)

## 2013-02-01 LAB — CBC
MCV: 87.6 fL (ref 78.0–100.0)
Platelets: 250 10*3/uL (ref 150–400)
RBC: 4.52 MIL/uL (ref 3.87–5.11)
WBC: 6.9 10*3/uL (ref 4.0–10.5)

## 2013-02-01 LAB — TROPONIN I: Troponin I: <0.3 ng/mL (ref <0.30)

## 2013-02-01 MED ORDER — POTASSIUM CHLORIDE CRYS ER 20 MEQ PO TBCR
EXTENDED_RELEASE_TABLET | ORAL | Status: AC
  Administered 2013-02-01: 40 meq via ORAL
  Filled 2013-02-01: qty 2

## 2013-02-01 MED ORDER — WARFARIN SODIUM 4 MG PO TABS
4.0000 mg | ORAL_TABLET | Freq: Once | ORAL | Status: AC
  Administered 2013-02-01: 4 mg via ORAL
  Filled 2013-02-01: qty 1

## 2013-02-01 MED ORDER — POTASSIUM CHLORIDE CRYS ER 20 MEQ PO TBCR
40.0000 meq | EXTENDED_RELEASE_TABLET | ORAL | Status: AC
  Administered 2013-02-01 (×2): 40 meq via ORAL
  Filled 2013-02-01: qty 2

## 2013-02-01 NOTE — Progress Notes (Signed)
Reason for Consult: Bradycardia Referring Physician: TRH   HPI: The patient is a 79 y/o female, followed at [facility] by Dr. Alanda Amass. She has a history of PAF, on Coumadin for anticoagulation. Her initial episode of AF was in September 2011. She underwent TEE cardioversion for AF in December 2013 when she had community-acquired pneumonia and hypokalemia. She carries a diagnosis of COPD, possible pulmonary fibrosis and she has had elevated LFTs in the past.  She has not required catheterization, has not had any chest pain and she had a negative Myoview for Ischemia in June 2011. A 2D echo in December 2013 showed good systolic function with no significant valve abnormality.  She presented to [facility] yesterday with a complaint of generalized weakness. She was found to be hyokalemic with a K+ of 3.0. Her initial EKG also noted sinus bradycardia w/ a HR of 59 bpm and QT prolongation (QTC 538). No ischemic changes. She has ruled out for MI. CXR unremarkable. She has remained bradycardic with HRs in the mid 40s-low 60s. Her lowest HR has been in the 20s. She continues to feel weak, fatigue and mildly short of breath. She denies chest pain, palpitations, lightheadedness/dizziness, syncope/presyncope.   Past Medical History  Diagnosis Date  . Irregular heart beat   . Hypertension   . Hypercholesteremia   . Depression   . Atrial fibrillation   . Bowel obstruction     Past Surgical History  Procedure Laterality Date  . Tee without cardioversion  05/21/2012    Procedure: TRANSESOPHAGEAL ECHOCARDIOGRAM (TEE);  Surgeon: Chrystie Nose, MD;  Location: [facility] ENDOSCOPY;  Service: Cardiovascular;  Laterality: N/A;  . Cardioversion  05/21/2012    Procedure: CARDIOVERSION;  Surgeon: Chrystie Nose, MD;  Location: [facility] ENDOSCOPY;  Service: Cardiovascular;  Laterality: N/A;  . Abdominal surgery    . Tonsillectomy      Family History  Problem Relation Age of Onset  . Coronary artery disease Father     Social  History:  reports that she has never smoked. She has never used smokeless tobacco. She reports that she does not drink alcohol or use illicit drugs.  Allergies:  Allergies  Allergen Reactions  . Amoxicillin     Can't remember reaction  . Doxycycline     Can't remember reaction  . Penicillins     Can't remember reaction  . Sulfa Antibiotics     Can't remember reaction    Medications:  Prior to Admission medications   Medication Sig Start Date End Date Taking? Authorizing Provider  acetaminophen (TYLENOL) 500 MG tablet Take 500 mg by mouth at bedtime as needed. For pain to help sleep   Yes Historical Provider, MD  amiodarone (PACERONE) 200 MG tablet Take 200 mg by mouth daily.   Yes Historical Provider, MD  calcium-vitamin D (OSCAL WITH D) 500-200 MG-UNIT per tablet Take 1 tablet by mouth.   Yes Historical Provider, MD  ciprofloxacin (CIPRO) 750 MG tablet Take 750 mg by mouth every 12 (twelve) hours.   Yes Historical Provider, MD  FLUoxetine (PROZAC) 20 MG capsule Take 20 mg by mouth daily.   Yes Historical Provider, MD  furosemide (LASIX) 40 MG tablet Take 40 mg by mouth daily.   Yes Historical Provider, MD  losartan (COZAAR) 50 MG tablet Take 50 mg by mouth daily.   Yes Historical Provider, MD  mirtazapine (REMERON) 15 MG tablet Take 15 mg by mouth at bedtime.   Yes Historical Provider, MD  warfarin (COUMADIN) 5 MG tablet Take 2.5  mg by mouth See admin instructions. Take 1/2 tablet for three days then skip a day and repeat until INR is checked again next week.   Yes Historical Provider, MD     Results for orders placed during the hospital encounter of 01/31/13 (from the past 48 hour(s))  TROPONIN I     Status: None   Collection Time    01/31/13 10:27 AM      Result Value Range   Troponin I <0.30  <0.30 ng/mL   Comment:            Due to the release kinetics of cTnI,     a negative result within the first hours     of the onset of symptoms does not rule out     myocardial  infarction with certainty.     If myocardial infarction is still suspected,     repeat the test at appropriate intervals.  CBC WITH DIFFERENTIAL     Status: None   Collection Time    01/31/13 10:27 AM      Result Value Range   WBC 6.8  4.0 - 10.5 K/uL   RBC 4.87  3.87 - 5.11 MIL/uL   Hemoglobin 14.7  12.0 - 15.0 g/dL   HCT 91.4  78.2 - 95.6 %   MCV 87.7  78.0 - 100.0 fL   MCH 30.2  26.0 - 34.0 pg   MCHC 34.4  30.0 - 36.0 g/dL   RDW 21.3  08.6 - 57.8 %   Platelets 262  150 - 400 K/uL   Neutrophils Relative % 71  43 - 77 %   Neutro Abs 4.8  1.7 - 7.7 K/uL   Lymphocytes Relative 17  12 - 46 %   Lymphs Abs 1.1  0.7 - 4.0 K/uL   Monocytes Relative 11  3 - 12 %   Monocytes Absolute 0.8  0.1 - 1.0 K/uL   Eosinophils Relative 1  0 - 5 %   Eosinophils Absolute 0.1  0.0 - 0.7 K/uL   Basophils Relative 1  0 - 1 %   Basophils Absolute 0.0  0.0 - 0.1 K/uL  COMPREHENSIVE METABOLIC PANEL     Status: Abnormal   Collection Time    01/31/13 10:27 AM      Result Value Range   Sodium 140  135 - 145 mEq/L   Potassium 3.0 (*) 3.5 - 5.1 mEq/L   Chloride 101  96 - 112 mEq/L   CO2 30  19 - 32 mEq/L   Glucose, Bld 103 (*) 70 - 99 mg/dL   BUN 13  6 - 23 mg/dL   Creatinine, Ser 4.69  0.50 - 1.10 mg/dL   Calcium 9.0  8.4 - 62.9 mg/dL   Total Protein 6.7  6.0 - 8.3 g/dL   Albumin 3.7  3.5 - 5.2 g/dL   AST 23  0 - 37 U/L   ALT 23  0 - 35 U/L   Alkaline Phosphatase 49  39 - 117 U/L   Total Bilirubin 0.4  0.3 - 1.2 mg/dL   GFR calc non Af Amer 60 (*) >90 mL/min   GFR calc Af Amer 70 (*) >90 mL/min   Comment: (NOTE)     The eGFR has been calculated using the CKD EPI equation.     This calculation has not been validated in all clinical situations.     eGFR's persistently <90 mL/min signify possible Chronic Kidney     Disease.  PROTIME-INR  Status: Abnormal   Collection Time    01/31/13 10:27 AM      Result Value Range   Prothrombin Time 16.9 (*) 11.6 - 15.2 seconds   INR 1.41  0.00 - 1.49   MAGNESIUM     Status: None   Collection Time    01/31/13 10:27 AM      Result Value Range   Magnesium 2.0  1.5 - 2.5 mg/dL  GLUCOSE, CAPILLARY     Status: Abnormal   Collection Time    01/31/13 10:45 AM      Result Value Range   Glucose-Capillary 103 (*) 70 - 99 mg/dL  CG4 I-STAT (LACTIC ACID)     Status: None   Collection Time    01/31/13 10:48 AM      Result Value Range   Lactic Acid, Venous 0.97  0.5 - 2.2 mmol/L  URINALYSIS W MICROSCOPIC + REFLEX CULTURE     Status: None   Collection Time    01/31/13 12:15 PM      Result Value Range   Color, Urine YELLOW  YELLOW   APPearance CLEAR  CLEAR   Specific Gravity, Urine 1.007  1.005 - 1.030   pH 7.5  5.0 - 8.0   Glucose, UA NEGATIVE  NEGATIVE mg/dL   Hgb urine dipstick NEGATIVE  NEGATIVE   Bilirubin Urine NEGATIVE  NEGATIVE   Ketones, ur NEGATIVE  NEGATIVE mg/dL   Protein, ur NEGATIVE  NEGATIVE mg/dL   Urobilinogen, UA 1.0  0.0 - 1.0 mg/dL   Nitrite NEGATIVE  NEGATIVE   Leukocytes, UA NEGATIVE  NEGATIVE   WBC, UA 0-2  <3 WBC/hpf   Squamous Epithelial / LPF RARE  RARE  TSH     Status: None   Collection Time    01/31/13  6:58 PM      Result Value Range   TSH 1.062  0.350 - 4.500 uIU/mL   Comment: Performed at [facility]  T4, FREE     Status: Abnormal   Collection Time    01/31/13  6:58 PM      Result Value Range   Free T4 1.94 (*) 0.80 - 1.80 ng/dL   Comment: Performed at [facility]  TROPONIN I     Status: None   Collection Time    01/31/13  6:58 PM      Result Value Range   Troponin I <0.30  <0.30 ng/mL   Comment:            Due to the release kinetics of cTnI,     a negative result within the first hours     of the onset of symptoms does not rule out     myocardial infarction with certainty.     If myocardial infarction is still suspected,     repeat the test at appropriate intervals.  BASIC METABOLIC PANEL     Status: Abnormal   Collection Time    01/31/13  6:58 PM      Result Value  Range   Sodium 144  135 - 145 mEq/L   Potassium 3.2 (*) 3.5 - 5.1 mEq/L   Chloride 105  96 - 112 mEq/L   CO2 27  19 - 32 mEq/L   Glucose, Bld 69 (*) 70 - 99 mg/dL   BUN 11  6 - 23 mg/dL   Creatinine, Ser 1.19  0.50 - 1.10 mg/dL   Calcium 8.7  8.4 - 14.7 mg/dL   GFR calc non Af  Amer 77 (*) >90 mL/min   GFR calc Af Amer 90 (*) >90 mL/min   Comment: (NOTE)     The eGFR has been calculated using the CKD EPI equation.     This calculation has not been validated in all clinical situations.     eGFR's persistently <90 mL/min signify possible Chronic Kidney     Disease.  TROPONIN I     Status: None   Collection Time    01/31/13 11:30 PM      Result Value Range   Troponin I <0.30  <0.30 ng/mL   Comment:            Due to the release kinetics of cTnI,     a negative result within the first hours     of the onset of symptoms does not rule out     myocardial infarction with certainty.     If myocardial infarction is still suspected,     repeat the test at appropriate intervals.  BASIC METABOLIC PANEL     Status: Abnormal   Collection Time    02/01/13  4:35 AM      Result Value Range   Sodium 141  135 - 145 mEq/L   Potassium 3.7  3.5 - 5.1 mEq/L   Chloride 105  96 - 112 mEq/L   CO2 27  19 - 32 mEq/L   Glucose, Bld 93  70 - 99 mg/dL   BUN 12  6 - 23 mg/dL   Creatinine, Ser 1.61  0.50 - 1.10 mg/dL   Calcium 8.7  8.4 - 09.6 mg/dL   GFR calc non Af Amer 68 (*) >90 mL/min   GFR calc Af Amer 79 (*) >90 mL/min   Comment: (NOTE)     The eGFR has been calculated using the CKD EPI equation.     This calculation has not been validated in all clinical situations.     eGFR's persistently <90 mL/min signify possible Chronic Kidney     Disease.  CBC     Status: None   Collection Time    02/01/13  4:35 AM      Result Value Range   WBC 6.9  4.0 - 10.5 K/uL   RBC 4.52  3.87 - 5.11 MIL/uL   Hemoglobin 13.5  12.0 - 15.0 g/dL   HCT 04.5  40.9 - 81.1 %   MCV 87.6  78.0 - 100.0 fL   MCH 29.9   26.0 - 34.0 pg   MCHC 34.1  30.0 - 36.0 g/dL   RDW 91.4  78.2 - 95.6 %   Platelets 250  150 - 400 K/uL  TROPONIN I     Status: None   Collection Time    02/01/13  4:35 AM      Result Value Range   Troponin I <0.30  <0.30 ng/mL   Comment:            Due to the release kinetics of cTnI,     a negative result within the first hours     of the onset of symptoms does not rule out     myocardial infarction with certainty.     If myocardial infarction is still suspected,     repeat the test at appropriate intervals.  PROTIME-INR     Status: Abnormal   Collection Time    02/01/13  4:35 AM      Result Value Range   Prothrombin Time 16.6 (*) 11.6 - 15.2 seconds  INR 1.38  0.00 - 1.49    Dg Chest 2 View  01/31/2013   *RADIOLOGY REPORT*  Clinical Data: Weakness.  Shortness of breath.  CHEST - 2 VIEW  Comparison: Chest x-ray 05/23/2012.  Findings: Lung volumes are normal.  No consolidative airspace disease.  No pleural effusions.  No pneumothorax.  No pulmonary nodule or mass noted.  Pulmonary vasculature and the cardiomediastinal silhouette are within normal limits. Atherosclerosis in the thoracic aorta.  Calcified granuloma in the right apex is unchanged.  IMPRESSION: 1. No radiographic evidence of acute cardiopulmonary disease. 2.  Atherosclerosis.   Original Report Authenticated By: Trudie Reed, M.D.    Review of Systems  Constitutional: Positive for malaise/fatigue.  Respiratory: Positive for shortness of breath. Hemoptysis: mild.   Cardiovascular: Negative for chest pain and palpitations.  Gastrointestinal: Positive for constipation.  Neurological: Positive for weakness. Negative for dizziness and loss of consciousness.  All other systems reviewed and are negative.   Blood pressure 89/69, pulse 41, temperature 98.1 F (36.7 C), temperature source Oral, resp. rate 17, height 5\' 3"  (1.6 m), weight 139 lb 15.9 oz (63.5 kg), SpO2 99.00%. Physical Exam  Constitutional: She is oriented  to person, place, and time. She appears well-developed and well-nourished. No distress.  HENT:  Head: Normocephalic and atraumatic.  Neck: No JVD present. Carotid bruit is not present.  Cardiovascular: An irregularly irregular rhythm present. Bradycardia present.  Exam reveals no gallop and no friction rub.   No murmur heard. Pulses:      Radial pulses are 2+ on the right side, and 2+ on the left side.       Dorsalis pedis pulses are 2+ on the right side, and 2+ on the left side.  Respiratory: Effort normal and breath sounds normal. No respiratory distress. She has no rales.  GI: Soft. Bowel sounds are normal. She exhibits no distension and no mass. There is no tenderness.  Musculoskeletal: She exhibits no edema.  Neurological: She is alert and oriented to person, place, and time.  Skin: Skin is warm and dry. She is not diaphoretic.  Psychiatric: She has a normal mood and affect. Her behavior is normal.    Assessment/Plan: Principal Problem:   Malaise and fatigue Active Problems:   Hypokalemia   Bradycardia by electrocardiogram   Atrial fibrillation  Plan: Pt has had significant bradycardia with HRs as low as the 20s. HR currently is in the mid 40s-50s. Current rhythm is atrial fibrillation. Her blood pressure is stable w/ most recent being 129/42. Bradycardia is likely the cause of her weakness and fatigue. Amiodarone for PAF is now on hold. Temporary pacer pads are in place. Pt will likely benefit from an PPM. Continue on Coumadin. INR is sub therapeutic at 1.38. Goal INR is 2.0-3.0. MD to follow.    Allayne Butcher, PA-C 02/01/2013, 7:25 PM

## 2013-02-01 NOTE — Progress Notes (Signed)
The patient's HR went into the 30s sustained at around 2030.  Her lowest HR was 29 and she was having pauses.  The patient's O2 sats were in the mid-90s on RA, but she was temporarily placed on O2 as a precaution.  The AED pads were placed on the patient and she was hooked up to the Zoll for monitoring.  Her BP was 103/46.  She stated that she didn't feel well and complained of dizziness upon sitting.  Her HR went up into the 50s and 60s upon sitting.  [facility] was notified.  The charge nurse called rapid response and rapid response assessed the patient.  An EKG was obtained and she was found to be in A. Fib with a slow ventricular response.  [facility] also came to assess the patient.  New orders were given to transfer the patient to the stepdown unit and to keep atropine at the bedside.  [facility]TS also wanted the AED pads to remain on the patient and for her to continue to be hooked up to the Zoll monitor.  The RN will carry out the orders.

## 2013-02-01 NOTE — Progress Notes (Signed)
Subjective: Overnight patient was persistently bradycardic with normal BP, AED pads were placed and patient was transferred to stepdown. Potassium was supplemented at this time as well. On my interview this morning, patient feels better than she did yesterday, although she does still feel generally weak. She has not gotten out of bed since admission, but she has no symptoms at rest- denies CP, dizziness, SOB, abd pain, N/V.   Objective: Vital signs in last 24 hours: Filed Vitals:   02/01/13 0800 02/01/13 1200 02/01/13 1235 02/01/13 1600  BP: 151/55  134/61 89/69  Pulse: 36 41    Temp: 97.8 F (36.6 C) 98.3 F (36.8 C)  98.1 F (36.7 C)  TempSrc: Oral Oral  Oral  Resp: 19  15 17   Height:      Weight:      SpO2: 99% 98% 100% 99%   Weight change:   Intake/Output Summary (Last 24 hours) at 02/01/13 1617 Last data filed at 02/01/13 1600  Gross per 24 hour  Intake   1538 ml  Output   2175 ml  Net   -637 ml   Physical Exam  General: alert, cooperative, and in no apparent distress, but she appears fatigued HEENT: pupils equal round and reactive to light, vision grossly intact, no scleral icterus, oropharynx clear and non-erythematous, MMM Neck: supple, no cervical lymphadenopathy, JVD, or carotid bruits Lungs: clear to ascultation bilaterally, though decreased breath sounds to bilateral bases, normal work of respiration, no wheezes, rales, ronchi; AED pad in place Heart: bradycardic to 30s, regular rhythm, no murmurs, gallops, or rubs Abdomen: soft, non-tender, non-distended, normal bowel sounds  Extremities: warm extremities bilaterally, no BLE edema Neurologic: alert & oriented X3, cranial nerves II-XII intact, strength 5/5 throughout, sensation intact to light touch, cerebellar function intact  Lab Results: Basic Metabolic Panel:  Recent Labs Lab 01/31/13 1027 01/31/13 1858 02/01/13 0435  NA 140 144 141  K 3.0* 3.2* 3.7  CL 101 105 105  CO2 30 27 27   GLUCOSE 103* 69*  93  BUN 13 11 12   CREATININE 0.89 0.78 0.80  CALCIUM 9.0 8.7 8.7  MG 2.0  --   --    Liver Function Tests:  Recent Labs Lab 01/31/13 1027  AST 23  ALT 23  ALKPHOS 49  BILITOT 0.4  PROT 6.7  ALBUMIN 3.7   CBC:  Recent Labs Lab 01/31/13 1027 02/01/13 0435  WBC 6.8 6.9  NEUTROABS 4.8  --   HGB 14.7 13.5  HCT 42.7 39.6  MCV 87.7 87.6  PLT 262 250   Cardiac Enzymes:  Recent Labs Lab 01/31/13 1858 01/31/13 2330 02/01/13 0435  TROPONINI <0.30 <0.30 <0.30   CBG:  Recent Labs Lab 01/31/13 1045  GLUCAP 103*   Thyroid Function Tests:  Recent Labs Lab 01/31/13 1858  TSH 1.062  FREET4 1.94*   Coagulation:  Recent Labs Lab 01/31/13 1027 02/01/13 0435  LABPROT 16.9* 16.6*  INR 1.41 1.38   Urinalysis:  Recent Labs Lab 01/31/13 1215  COLORURINE YELLOW  LABSPEC 1.007  PHURINE 7.5  GLUCOSEU NEGATIVE  HGBUR NEGATIVE  BILIRUBINUR NEGATIVE  KETONESUR NEGATIVE  PROTEINUR NEGATIVE  UROBILINOGEN 1.0  NITRITE NEGATIVE  LEUKOCYTESUR NEGATIVE   Studies/Results: Dg Chest 2 View  01/31/2013   *RADIOLOGY REPORT*  Clinical Data: Weakness.  Shortness of breath.  CHEST - 2 VIEW  Comparison: Chest x-ray 05/23/2012.  Findings: Lung volumes are normal.  No consolidative airspace disease.  No pleural effusions.  No pneumothorax.  No pulmonary nodule  or mass noted.  Pulmonary vasculature and the cardiomediastinal silhouette are within normal limits. Atherosclerosis in the thoracic aorta.  Calcified granuloma in the right apex is unchanged.  IMPRESSION: 1. No radiographic evidence of acute cardiopulmonary disease. 2.  Atherosclerosis.   Original Report Authenticated By: Trudie Reed, M.D.   Medications: I have reviewed the patient's current medications. Scheduled Meds: . aspirin EC  81 mg Oral Daily  . docusate sodium  100 mg Oral BID  . furosemide  40 mg Oral Daily  . heparin  5,000 Units Subcutaneous Q8H  . losartan  50 mg Oral Daily  . polyethylene glycol  17  g Oral Daily  . sodium chloride  3 mL Intravenous Q12H  . warfarin  4 mg Oral ONCE-1800  . Warfarin - Pharmacist Dosing Inpatient   Does not apply q1800   Assessment/Plan:  [name] is a 79yo WF with h/o A fib, diastolic CHF, pulmonary fibrosis, HTN who presents with c/o generalized weakness and malaise.   # Generalized weakness: Patient reports worsening generalized weakness with no other symptoms or focal neurological signs for the past few days after starting Remeron last Thursday. Patient is also on amiodarone, which can inhibit the metabolism of QT prolonging medications such as Remeron. Fatigue/drowsiness are known side effects of both Remeron and Amiodarone. Additionally, patient has long QT that appears to be new since her last EKG in December 2013. This is likely 2/2 the new addition of Remeron to her medication regimen in combination with her amiodarone. Patient is bradycardic and this can also lead to generalized malaise.  -stop remeron and hold amiodarone- will likely take a few days before these medications wear off -TSH wnl/Free T4 slightly elevated  -trop neg x 3 -replete lytes as needed- patient has been hypokalemic, so will continue to monitor and supp prn -discontinued cipro as this can cause QT prolongation and patient's UA is clean and her symptoms have completely resolved  -holding prozac given this can cause drowsiness- though this has a long half life  # Bradycardia: Unclear etiology at this time. Patient remains hemodynamically stable with no symptoms other than generalized fatigue. Tele shows intermittent T and P wave morphology abnormalities with her baseline rhythm being sinus bradycardia. Rate appears to be decreasing, now persistently in the 30s and 40s. Amiodarone can cause bradycardia, but she has not had a recent increase in her dose and she has been on this medication for years. Hypokalemia can also cause sinus bradycardia. EKG with long PR, but no dropped beats,  which may represent 1st degree AV block.  -stepdown unit -holding amiodarone  -cardiology following  # A fib: Patient is on amiodarone and warfarin with subtherapeutic INR on admission. Admission EKG shows sinus bradycardia. H/o cardioversion in Dec. 2013.  -warfarin per pharmacy  -hold amiodarone -monitor PT/INR   # diastolic CHF: Patient does not appear volume overloaded.  - continue lasix and losartan   # HTN: BP ranging from 92-156/60-80. She is not orthostatic.  -continue lasix and losartan  # VTE: SCDs, heparin   # Diet: heart   Code status: full  Dispo: Disposition is deferred at this time, awaiting improvement of current medical problems.  Anticipated discharge in approximately 2-3 day(s).   The patient does have a current PCP Andrey Campanile Elizebeth Koller, MD) and does not need an [facility] hospital follow-up appointment after discharge.  The patient does not have transportation limitations that hinder transportation to clinic appointments.  .Services Needed at time of discharge: Y =  Yes, Blank = No PT:   OT:   RN:   Equipment:   Other:     LOS: 1 day   Windell Hummingbird, MD 02/01/2013, 4:17 PM

## 2013-02-01 NOTE — Progress Notes (Signed)
ANTICOAGULATION CONSULT NOTE - follow up  Pharmacy Consult for Coumadin Indication: atrial fibrillation  Allergies  Allergen Reactions  . Amoxicillin     Can't remember reaction  . Doxycycline     Can't remember reaction  . Penicillins     Can't remember reaction  . Sulfa Antibiotics     Can't remember reaction    Vital Signs: Temp: 97.8 F (36.6 C) (09/02 0800) Temp src: Oral (09/02 0800) BP: 151/55 mmHg (09/02 0800) Pulse Rate: 36 (09/02 0800)  Labs:  Recent Labs  01/31/13 1027 01/31/13 1858 01/31/13 2330 02/01/13 0435  HGB 14.7  --   --  13.5  HCT 42.7  --   --  39.6  PLT 262  --   --  250  LABPROT 16.9*  --   --  16.6*  INR 1.41  --   --  1.38  CREATININE 0.89 0.78  --  0.80  TROPONINI <0.30 <0.30 <0.30 <0.30    Estimated Creatinine Clearance: 51.1 ml/min (by C-G formula based on Cr of 0.8).   Assessment: 79 yo female admitted with generalized weakness and malaise over the last few days. She was prescribed a 10 day course of Cipro for a UTI and is on day 3 according to the PTA medication list. She also recently started mirtazapine for sleep.  Pharmacy consulted to manage Coumadin for Afib. She is s/p DCCV in December. INR is subtherapeutic at 1.41 on admission. No bleeding noted, CBC is normal.  Home Coumadin dose is 2.5 mg daily for 3 days then skip a day and repeat. Son unsure if patient had dose last night.  Today INR is 1.38 after 4 mg dose last night.   Goal of Therapy:  INR 2-3 Monitor platelets by anticoagulation protocol: Yes   Plan:  - repeat Coumadin 4 mg PO tonight - INR daily - Monitor for s/sx of bleeding  - she is on heparin 5 K sq q8h until INR tx Herby Abraham, Pharm.D. [phone] 02/01/2013 11:19 AM

## 2013-02-01 NOTE — H&P (Signed)
INTERNAL MEDICINE TEACHING SERVICE Attending Admission Note  Date: 02/01/2013  Patient name: [name]  Medical record number: [id number]  Date of birth: [birth year]    I have seen and evaluated [name] and discussed their care with the Residency Team.   81 yr. Old WF w/ hx Afib on coumadin, HFPEF, pulmonary fibrosis, HTN, presented with weakness and malaise.  She was recently placed on cipro for UTI and also remeron to help her sleep. She has reported symptoms concerning for pre-syncope.  She was noted to have hypokalemia, sinus bradycardia, prolonged QTc and a 1st degree AVB. Overnight she was transferred to stepdown due to HR decreasing to upper 20's/low 30's.  She has maintained hemodynamic stability. At this time, continue to hold amiodarone, Remeron, Cipro.  K has been replaced. Pacer pads on. Consult cardiology. Will need to consider waiting for medication effects to wear off vs PPM placement.  She has ruled out for ACS.  Jonah Blue, DO, FACP Faculty [facility] Internal Medicine Residency Program 02/01/2013, 1:04 PM

## 2013-02-01 NOTE — Progress Notes (Signed)
  Date: 02/01/2013  Patient name: RAMONA RUARK  Medical record number: [id number]  Date of birth: [birth year]   This patient has been seen and the plan of care was discussed with the house staff. Please see their note for complete details. I concur with their findings with the following additions/corrections:  Events overnight noted.  She is hemodynamically stable.  Complains of fatigue, but no dizziness, SOB, or CP.  I suspect her recent medication regimen, including remeron, contributed to this.  For now, continue to monitor on telemetry. Pacer pads on.  Cardiology consult.  Will likely need a few days for medication effects to wear off before consideration of PPM.  Jonah Blue, DO, FACP Faculty [facility] Residency Program 02/01/2013, 11:59 AM

## 2013-02-01 NOTE — Progress Notes (Signed)
Pt. Seen and examined. Agree with the NP/PA-C note as written.  79 yo female patient of Dr. Alanda Amass, known to me from a TEE/Cardioversion I performed on her last year. She had been loaded on amiodarone and maintained sinus, until recently, where she has had marked bradycardia and PAF with slow ventricular response. She appears to be alternating between sinus in the 60's and PAF in the 40-50's. There have been documented HR's in the 20-30 range overnight. She reports no chest pain, but has been dyspneic and very fatigued recently. She was on amiodarone, but no other negative inotropes. This has been held, but has a washout period of at least 2-4 weeks or more. Despite this, there are clear signs of sinus node dysfunction. She has ruled-out for ACS.   Impression: 1. PAF with symptomatic bradycardia 2. Probable sinus node dysfunction  Plan: 1. There appears to be a good indication for pacemaker in this situation. She is clinically stable at this time. Unfortunately, due to the lipophilic properties of amiodarone, the medication will continue to exert effects for some time. I do think she qualifies for pacemaker. Will d/w Dr. Royann Shivers and tentatively plan on Thursday as an inpatient.  Thanks for consulting [place].  We will follow along closely with you.  Chrystie Nose, MD, [facility] Attending Cardiologist [facility]

## 2013-02-01 NOTE — Care Management Note (Signed)
    Page 1 of 1   02/01/2013     11:33:57 AM   CARE MANAGEMENT NOTE 02/01/2013  Patient:  [name],[name]   Account Number:  [id number]  Date Initiated:  02/01/2013  Documentation initiated by:  Junius Creamer  Subjective/Objective Assessment:   adm w weakness     Action/Plan:   lives alone, husband in nsg home, pcp dr Andrey Campanile elkins   Anticipated DC Date:     Anticipated DC Plan:        DC Planning Services  CM consult      Choice offered to / List presented to:             Status of service:   Medicare Important Message given?   (If response is "NO", the following Medicare IM given date fields will be blank) Date Medicare IM given:   Date Additional Medicare IM given:    Discharge Disposition:    Per UR Regulation:  Reviewed for med. necessity/level of care/duration of stay  If discussed at Long Length of Stay Meetings, dates discussed:    Comments:

## 2013-02-02 DIAGNOSIS — I4891 Unspecified atrial fibrillation: Secondary | ICD-10-CM

## 2013-02-02 DIAGNOSIS — Z7901 Long term (current) use of anticoagulants: Secondary | ICD-10-CM

## 2013-02-02 LAB — BASIC METABOLIC PANEL
BUN: 15 mg/dL (ref 6–23)
CO2: 29 mEq/L (ref 19–32)
Chloride: 108 mEq/L (ref 96–112)
Creatinine, Ser: 0.87 mg/dL (ref 0.50–1.10)
Glucose, Bld: 97 mg/dL (ref 70–99)

## 2013-02-02 LAB — PROTIME-INR: Prothrombin Time: 17.9 seconds — ABNORMAL HIGH (ref 11.6–15.2)

## 2013-02-02 MED ORDER — CHLORHEXIDINE GLUCONATE 4 % EX LIQD
60.0000 mL | Freq: Once | CUTANEOUS | Status: DC
  Filled 2013-02-02: qty 120

## 2013-02-02 MED ORDER — SODIUM CHLORIDE 0.9 % IV SOLN: INTRAVENOUS | Status: DC

## 2013-02-02 MED ORDER — VANCOMYCIN HCL IN DEXTROSE 1-5 GM/200ML-% IV SOLN: 1000.0000 mg | INTRAVENOUS | Status: DC

## 2013-02-02 MED ORDER — SODIUM CHLORIDE 0.9 % IR SOLN: 80.0000 mg | Status: DC

## 2013-02-02 MED ORDER — WARFARIN SODIUM 4 MG PO TABS
4.0000 mg | ORAL_TABLET | Freq: Once | ORAL | Status: AC
Start: 2013-02-02 — End: 2013-02-02
  Administered 2013-02-02: 4 mg via ORAL
  Filled 2013-02-02: qty 1

## 2013-02-02 MED ORDER — CHLORHEXIDINE GLUCONATE 4 % EX LIQD
60.0000 mL | Freq: Once | CUTANEOUS | Status: AC
  Administered 2013-02-02: 4 via TOPICAL

## 2013-02-02 MED ORDER — SODIUM CHLORIDE 0.9 % IV SOLN
INTRAVENOUS | Status: DC
  Administered 2013-02-03: 09:00:00 via INTRAVENOUS

## 2013-02-02 MED ORDER — VANCOMYCIN HCL IN DEXTROSE 1-5 GM/200ML-% IV SOLN
1000.0000 mg | INTRAVENOUS | Status: DC
  Filled 2013-02-02: qty 200

## 2013-02-02 MED ORDER — SODIUM CHLORIDE 0.9 % IR SOLN
80.0000 mg | Status: DC
  Filled 2013-02-02: qty 2

## 2013-02-02 MED ORDER — DIAZEPAM 5 MG PO TABS: 5.0000 mg | ORAL_TABLET | ORAL | Status: DC

## 2013-02-02 NOTE — Progress Notes (Signed)
  Date: 02/02/2013  Patient name: [name]  Medical record number: [id number]  Date of birth: [birth year]   This patient has been seen and the plan of care was discussed with the house staff. Please see their note for complete details. I concur with their findings with the following additions/corrections:  Denies CP, SOB, dizziness. Noted to be bradycardic into HR 20's overnight. She has pauses up to 3 seconds as well.  Appreciate cards input. Plans for PPM tomorrow. Discussed plans with patient, she is anxious but agrees. All questions answered.  Jonah Blue, DO, FACP Faculty [facility] Residency Program 02/02/2013, 11:31 AM

## 2013-02-02 NOTE — Progress Notes (Signed)
ANTICOAGULATION CONSULT NOTE - follow up  Pharmacy Consult for Coumadin Indication: atrial fibrillation  Allergies  Allergen Reactions  . Amoxicillin     Can't remember reaction  . Doxycycline     Can't remember reaction  . Penicillins     Can't remember reaction  . Sulfa Antibiotics     Can't remember reaction    Vital Signs: Temp: 97.6 F (36.4 C) (09/03 0800) Temp src: Oral (09/03 0800) BP: 168/74 mmHg (09/03 1000) Pulse Rate: 36 (09/03 0800)  Labs:  Recent Labs  01/31/13 1027 01/31/13 1858 01/31/13 2330 02/01/13 0435 02/02/13 0400  HGB 14.7  --   --  13.5  --   HCT 42.7  --   --  39.6  --   PLT 262  --   --  250  --   LABPROT 16.9*  --   --  16.6* 17.9*  INR 1.41  --   --  1.38 1.52*  CREATININE 0.89 0.78  --  0.80 0.87  TROPONINI <0.30 <0.30 <0.30 <0.30  --     Estimated Creatinine Clearance: 47 ml/min (by C-G formula based on Cr of 0.87).   Assessment: 79 yo female admitted with generalized weakness and malaise over the last few days. She was prescribed a 10 day course of Cipro for a UTI and is on day 3 according to the PTA medication list. She also recently started mirtazapine for sleep.  Pharmacy consulted to manage Coumadin for Afib. She is s/p DCCV in December. INR is subtherapeutic at 1.41 on admission. No bleeding noted, CBC is normal.  Home Coumadin dose is 2.5 mg daily for 3 days then skip a day and repeat.   Today INR is 1.52 after 2 doses of 4 mg.  To get PPM in am.  Dr. Rennis Golden wants her to get coumadin tonight.  Goal of Therapy:  INR 2-3 Monitor platelets by anticoagulation protocol: Yes   Plan:  - repeat Coumadin 4 mg PO tonight - INR daily - Monitor for s/sx of bleeding  - she is on heparin 5 K sq q8h until INR tx - for PPM in am Herby Abraham, Pharm.D. [phone] 02/02/2013 10:43 AM

## 2013-02-02 NOTE — Plan of Care (Signed)
Problem: Consults Goal: Permanent Pacemaker Patient Education (See Patient Education module for education specifics.) Outcome: Progressing Pt completed Life with Pacemaker video.

## 2013-02-02 NOTE — Progress Notes (Signed)
[facility]  Subjective: She continues to feel weak and fatigued. No other complaints.   Objective: Vital signs in last 24 hours: Temp:  [97.5 F (36.4 C)-98.3 F (36.8 C)] 97.5 F (36.4 C) (09/03 0400) Pulse Rate:  [36-41] 41 (09/02 1200) Resp:  [12-19] 17 (09/03 0400) BP: (89-151)/(54-79) 134/79 mmHg (09/03 0400) SpO2:  [97 %-100 %] 98 % (09/03 0400) Last BM Date: 02/01/13  Intake/Output from previous day: 09/02 0701 - 09/03 0700 In: 1200 [P.O.:1200] Out: 1975 [Urine:1975] Intake/Output this shift:    Medications Current Facility-Administered Medications  Medication Dose Route Frequency Provider Last Rate Last Dose  . aspirin EC tablet 81 mg  81 mg Oral Daily Genelle Gather, MD   81 mg at 02/01/13 1191  . docusate sodium (COLACE) capsule 100 mg  100 mg Oral BID Genelle Gather, MD   100 mg at 02/01/13 2141  . furosemide (LASIX) tablet 40 mg  40 mg Oral Daily Genelle Gather, MD   40 mg at 02/01/13 0925  . heparin injection 5,000 Units  5,000 Units Subcutaneous Q8H Genelle Gather, MD   5,000 Units at 02/02/13 0615  . losartan (COZAAR) tablet 50 mg  50 mg Oral Daily Genelle Gather, MD   50 mg at 02/01/13 0925  . polyethylene glycol (MIRALAX / GLYCOLAX) packet 17 g  17 g Oral Daily Genelle Gather, MD   17 g at 02/01/13 0925  . sodium chloride 0.9 % injection 3 mL  3 mL Intravenous Q12H Genelle Gather, MD   3 mL at 02/01/13 2141  . Warfarin - Pharmacist Dosing Inpatient   Does not apply q1800 [facility]        PE: General appearance: alert, cooperative and no distress Lungs: clear to auscultation bilaterally Heart: irregularly irregular rhythm, bradycardic Extremities: no LEE Pulses: 2+ and symmetric Skin: warm and dry Neurologic: Grossly normal  Lab Results:   Recent Labs  01/31/13 1027 02/01/13 0435  WBC 6.8 6.9  HGB 14.7 13.5  HCT 42.7 39.6  PLT 262 250   BMET  Recent Labs  01/31/13 1858  02/01/13 0435 02/02/13 0400  NA 144 141 144  K 3.2* 3.7 4.1  CL 105 105 108  CO2 27 27 29   GLUCOSE 69* 93 97  BUN 11 12 15   CREATININE 0.78 0.80 0.87  CALCIUM 8.7 8.7 8.7   PT/INR  Recent Labs  01/31/13 1027 02/01/13 0435 02/02/13 0400  LABPROT 16.9* 16.6* 17.9*  INR 1.41 1.38 1.52*    Assessment/Plan  Principal Problem:   Symptomatic bradycardia Active Problems:   Hypokalemia   Pulmonary fibrosis   Malaise and fatigue   Atrial fibrillation with slow ventricular response   Sinus node dysfunction  Plan: Pt continues to have severe bradycardia with HRs as low as the upper 20s. Her only symptoms are weakness and fatigue. Her BP has remained stable. Most recent SBP is in the 130s. Labs are WNL. Continue with temporary pacer pads. Will plan for PPM insertion tomorrow.     LOS: 2 days    Brittainy M. Delmer Islam 02/02/2013 7:53 AM

## 2013-02-02 NOTE — Progress Notes (Signed)
Subjective: Overnight patient was persistently bradycardic down to the 20s-30s, with normal BP. Patient still feels fatigued, denies CP, dizziness, SOB, abd pain, N/V. Cardiology plans to place [facility] tomorrow. Patient feels anxious about this procedure, but otherwise no complaints today.   Objective: Vital signs in last 24 hours: Filed Vitals:   02/01/13 2000 02/02/13 0000 02/02/13 0400 02/02/13 0800  BP: 145/64 105/54 134/79 151/98  Pulse:    36  Temp: 97.8 F (36.6 C) 97.7 F (36.5 C) 97.5 F (36.4 C) 97.6 F (36.4 C)  TempSrc: Oral Oral Oral Oral  Resp: 14 12 17 17   Height:      Weight:      SpO2: 99% 97% 98% 99%   Weight change:   Intake/Output Summary (Last 24 hours) at 02/02/13 0847 Last data filed at 02/02/13 0600  Gross per 24 hour  Intake   1200 ml  Output   1775 ml  Net   -575 ml   Physical Exam  General: alert, cooperative, and in no apparent distress, but she appears fatigued HEENT: pupils equal round and reactive to light, vision grossly intact, no scleral icterus, oropharynx clear and non-erythematous, MMM Neck: supple, no cervical lymphadenopathy, JVD, or carotid bruits Lungs: clear to ascultation bilaterally, though decreased breath sounds to bilateral bases, normal work of respiration, no wheezes, rales, ronchi; AED pad in place Heart: bradycardic to 30s, regular rhythm, no murmurs, gallops, or rubs Abdomen: soft, non-tender, non-distended, normal bowel sounds  Extremities: warm extremities bilaterally, no BLE edema Neurologic: alert & oriented X3, cranial nerves II-XII intact, strength 5/5 throughout, sensation intact to light touch, cerebellar function intact  Lab Results: Basic Metabolic Panel:  Recent Labs Lab 01/31/13 1027  02/01/13 0435 02/02/13 0400  NA 140  < > 141 144  K 3.0*  < > 3.7 4.1  CL 101  < > 105 108  CO2 30  < > 27 29  GLUCOSE 103*  < > 93 97  BUN 13  < > 12 15  CREATININE 0.89  < > 0.80 0.87  CALCIUM 9.0  < > 8.7 8.7  MG  2.0  --   --   --   < > = values in this interval not displayed. Liver Function Tests:  Recent Labs Lab 01/31/13 1027  AST 23  ALT 23  ALKPHOS 49  BILITOT 0.4  PROT 6.7  ALBUMIN 3.7   CBC:  Recent Labs Lab 01/31/13 1027 02/01/13 0435  WBC 6.8 6.9  NEUTROABS 4.8  --   HGB 14.7 13.5  HCT 42.7 39.6  MCV 87.7 87.6  PLT 262 250   Cardiac Enzymes:  Recent Labs Lab 01/31/13 1858 01/31/13 2330 02/01/13 0435  TROPONINI <0.30 <0.30 <0.30   CBG:  Recent Labs Lab 01/31/13 1045  GLUCAP 103*   Thyroid Function Tests:  Recent Labs Lab 01/31/13 1858  TSH 1.062  FREET4 1.94*   Coagulation:  Recent Labs Lab 01/31/13 1027 02/01/13 0435 02/02/13 0400  LABPROT 16.9* 16.6* 17.9*  INR 1.41 1.38 1.52*   Urinalysis:  Recent Labs Lab 01/31/13 1215  COLORURINE YELLOW  LABSPEC 1.007  PHURINE 7.5  GLUCOSEU NEGATIVE  HGBUR NEGATIVE  BILIRUBINUR NEGATIVE  KETONESUR NEGATIVE  PROTEINUR NEGATIVE  UROBILINOGEN 1.0  NITRITE NEGATIVE  LEUKOCYTESUR NEGATIVE   Studies/Results: Dg Chest 2 View  01/31/2013   *RADIOLOGY REPORT*  Clinical Data: Weakness.  Shortness of breath.  CHEST - 2 VIEW  Comparison: Chest x-ray 05/23/2012.  Findings: Lung volumes are normal.  No consolidative airspace disease.  No pleural effusions.  No pneumothorax.  No pulmonary nodule or mass noted.  Pulmonary vasculature and the cardiomediastinal silhouette are within normal limits. Atherosclerosis in the thoracic aorta.  Calcified granuloma in the right apex is unchanged.  IMPRESSION: 1. No radiographic evidence of acute cardiopulmonary disease. 2.  Atherosclerosis.   Original Report Authenticated By: Trudie Reed, M.D.   Medications: I have reviewed the patient's current medications. Scheduled Meds: . aspirin EC  81 mg Oral Daily  . docusate sodium  100 mg Oral BID  . furosemide  40 mg Oral Daily  . heparin  5,000 Units Subcutaneous Q8H  . losartan  50 mg Oral Daily  . polyethylene glycol   17 g Oral Daily  . sodium chloride  3 mL Intravenous Q12H  . Warfarin - Pharmacist Dosing Inpatient   Does not apply q1800   Assessment/Plan:  [name] is a 79yo WF with h/o A fib, diastolic CHF, pulmonary fibrosis, HTN who presents with c/o generalized weakness and malaise.   # Generalized weakness: Patient reports worsening generalized weakness with no other symptoms or focal neurological signs for the past few days after starting Remeron last Thursday. Patient is also on amiodarone, which can inhibit the metabolism of QT prolonging medications such as Remeron. Fatigue/drowsiness are known side effects of both Remeron and Amiodarone. Additionally, patient has long QT that appears to be new since her last EKG in December 2013. This is likely 2/2 the new addition of Remeron and Cipro to her medication regimen in combination with her amiodarone. Patient is bradycardic and this can lead to generalized malaise.  -stop remeron and hold amiodarone- will likely take a few days before these medications wear off -TSH wnl/Free T4 slightly elevated  -ACS ruled out- trop neg x 3 -replete lytes as needed- K is 4.1 this morning -discontinued cipro as this can cause QT prolongation and patient's UA is clean and her symptoms have completely resolved  -holding prozac given this can cause drowsiness- though this has a long half life  # Bradycardia: Unclear etiology at this time. Patient remains hemodynamically stable with no symptoms other than generalized fatigue. Tele shows intermittent T and P wave morphology abnormalities with her baseline rhythm being sinus bradycardia alternating with A fib. Rate appears to be decreasing, now persistently in the 30s and 40s. Amiodarone can cause bradycardia, but she has not had a recent increase in her dose and she has been on this medication for years. Hypokalemia can also cause sinus bradycardia, which she presented with, however K levels have now normalized.  -stepdown  unit -holding amiodarone  -cardiology following- plan to place PPM tomorrow  # A fib: Patient is on amiodarone and warfarin with subtherapeutic INR on admission. Admission EKG shows sinus bradycardia. H/o cardioversion in Dec. 2013.  -warfarin per pharmacy  -hold amiodarone -monitor PT/INR   # diastolic CHF: Patient does not appear volume overloaded.  - continue lasix and losartan   # HTN: BP stable. She is not orthostatic.  -continue lasix and losartan  # VTE: SCDs, heparin   # Diet: heart   Code status: full  Dispo: Disposition is deferred at this time, awaiting improvement of current medical problems.  Anticipated discharge in approximately 2-3 day(s).   The patient does have a current PCP Andrey Campanile Elizebeth Koller, MD) and does not need an [facility] hospital follow-up appointment after discharge.  The patient does not have transportation limitations that hinder transportation to clinic appointments.  .Services Needed  at time of discharge: Y = Yes, Blank = No PT:   OT:   RN:   Equipment:   Other:     LOS: 2 days   Windell Hummingbird, MD 02/02/2013, 8:47 AM

## 2013-02-02 NOTE — Consult Note (Signed)
Reason for Consult: Bradycardia  Referring Physician: TRH  HPI: The patient is a 79 y/o female, followed at [facility] by Dr. Alanda Amass. She has a history of PAF, on Coumadin for anticoagulation. Her initial episode of AF was in September 2011. She underwent TEE cardioversion for AF in December 2013 when she had community-acquired pneumonia and hypokalemia. She carries a diagnosis of COPD, possible pulmonary fibrosis and she has had elevated LFTs in the past.  She has not required catheterization, has not had any chest pain and she had a negative Myoview for  Ischemia in June 2011. A 2D echo in December 2013 showed good systolic function with no significant valve abnormality.  She presented to [facility] yesterday with a complaint of generalized weakness. She was found to be hyokalemic with a K+ of 3.0. Her initial EKG also noted sinus bradycardia w/ a HR of 59 bpm and QT prolongation (QTC 538). No ischemic changes. She has ruled out for MI. CXR unremarkable. She has remained bradycardic with HRs in the mid 40s-low 60s. Her lowest HR has been in the 20s. She continues to feel weak, fatigue and mildly short of breath. She denies chest pain, palpitations, lightheadedness/dizziness, syncope/presyncope.  Past Medical History   Diagnosis  Date   .  Irregular heart beat    .  Hypertension    .  Hypercholesteremia    .  Depression    .  Atrial fibrillation    .  Bowel obstruction     Past Surgical History   Procedure  Laterality  Date   .  Tee without cardioversion   05/21/2012     Procedure: TRANSESOPHAGEAL ECHOCARDIOGRAM (TEE); Surgeon: Chrystie Nose, MD; Location: [facility] ENDOSCOPY; Service: Cardiovascular; Laterality: N/A;   .  Cardioversion   05/21/2012     Procedure: CARDIOVERSION; Surgeon: Chrystie Nose, MD; Location: [facility] ENDOSCOPY; Service: Cardiovascular; Laterality: N/A;   .  Abdominal surgery     .  Tonsillectomy      Family History   Problem  Relation  Age of Onset   .  Coronary artery disease   Father     Social History: reports that she has never smoked. She has never used smokeless tobacco. She reports that she does not drink alcohol or use illicit drugs.  Allergies:  Allergies   Allergen  Reactions   .  Amoxicillin      Can't remember reaction   .  Doxycycline      Can't remember reaction   .  Penicillins      Can't remember reaction   .  Sulfa Antibiotics      Can't remember reaction    Medications:  Prior to Admission medications   Medication  Sig  Start Date  End Date  Taking?  Authorizing Provider   acetaminophen (TYLENOL) 500 MG tablet  Take 500 mg by mouth at bedtime as needed. For pain to help sleep    Yes  Historical Provider, MD   amiodarone (PACERONE) 200 MG tablet  Take 200 mg by mouth daily.    Yes  Historical Provider, MD   calcium-vitamin D (OSCAL WITH D) 500-200 MG-UNIT per tablet  Take 1 tablet by mouth.    Yes  Historical Provider, MD   ciprofloxacin (CIPRO) 750 MG tablet  Take 750 mg by mouth every 12 (twelve) hours.    Yes  Historical Provider, MD   FLUoxetine (PROZAC) 20 MG capsule  Take 20 mg by mouth daily.    Yes  Historical  Provider, MD   furosemide (LASIX) 40 MG tablet  Take 40 mg by mouth daily.    Yes  Historical Provider, MD   losartan (COZAAR) 50 MG tablet  Take 50 mg by mouth daily.    Yes  Historical Provider, MD   mirtazapine (REMERON) 15 MG tablet  Take 15 mg by mouth at bedtime.    Yes  Historical Provider, MD   warfarin (COUMADIN) 5 MG tablet  Take 2.5 mg by mouth See admin instructions. Take 1/2 tablet for three days then skip a day and repeat until INR is checked again next week.    Yes  Historical Provider, MD    Results for orders placed during the hospital encounter of 01/31/13 (from the past 48 hour(s))   TROPONIN I Status: None    Collection Time    01/31/13 10:27 AM   Result  Value  Range    Troponin I  <0.30  <0.30 ng/mL    Comment:      Due to the release kinetics of cTnI,     a negative result within the first hours      of the onset of symptoms does not rule out     myocardial infarction with certainty.     If myocardial infarction is still suspected,     repeat the test at appropriate intervals.   CBC WITH DIFFERENTIAL Status: None    Collection Time    01/31/13 10:27 AM   Result  Value  Range    WBC  6.8  4.0 - 10.5 K/uL    RBC  4.87  3.87 - 5.11 MIL/uL    Hemoglobin  14.7  12.0 - 15.0 g/dL    HCT  40.9  81.1 - 91.4 %    MCV  87.7  78.0 - 100.0 fL    MCH  30.2  26.0 - 34.0 pg    MCHC  34.4  30.0 - 36.0 g/dL    RDW  78.2  95.6 - 21.3 %    Platelets  262  150 - 400 K/uL    Neutrophils Relative %  71  43 - 77 %    Neutro Abs  4.8  1.7 - 7.7 K/uL    Lymphocytes Relative  17  12 - 46 %    Lymphs Abs  1.1  0.7 - 4.0 K/uL    Monocytes Relative  11  3 - 12 %    Monocytes Absolute  0.8  0.1 - 1.0 K/uL    Eosinophils Relative  1  0 - 5 %    Eosinophils Absolute  0.1  0.0 - 0.7 K/uL    Basophils Relative  1  0 - 1 %    Basophils Absolute  0.0  0.0 - 0.1 K/uL   COMPREHENSIVE METABOLIC PANEL Status: Abnormal    Collection Time    01/31/13 10:27 AM   Result  Value  Range    Sodium  140  135 - 145 mEq/L    Potassium  3.0 (*)  3.5 - 5.1 mEq/L    Chloride  101  96 - 112 mEq/L    CO2  30  19 - 32 mEq/L    Glucose, Bld  103 (*)  70 - 99 mg/dL    BUN  13  6 - 23 mg/dL    Creatinine, Ser  0.86  0.50 - 1.10 mg/dL    Calcium  9.0  8.4 - 10.5 mg/dL    Total Protein  6.7  6.0 - 8.3 g/dL    Albumin  3.7  3.5 - 5.2 g/dL    AST  23  0 - 37 U/L    ALT  23  0 - 35 U/L    Alkaline Phosphatase  49  39 - 117 U/L    Total Bilirubin  0.4  0.3 - 1.2 mg/dL    GFR calc non Af Amer  60 (*)  >90 mL/min    GFR calc Af Amer  70 (*)  >90 mL/min    Comment:  (NOTE)     The eGFR has been calculated using the CKD EPI equation.     This calculation has not been validated in all clinical situations.     eGFR's persistently <90 mL/min signify possible Chronic Kidney     Disease.   PROTIME-INR Status: Abnormal    Collection  Time    01/31/13 10:27 AM   Result  Value  Range    Prothrombin Time  16.9 (*)  11.6 - 15.2 seconds    INR  1.41  0.00 - 1.49   MAGNESIUM Status: None    Collection Time    01/31/13 10:27 AM   Result  Value  Range    Magnesium  2.0  1.5 - 2.5 mg/dL   GLUCOSE, CAPILLARY Status: Abnormal    Collection Time    01/31/13 10:45 AM   Result  Value  Range    Glucose-Capillary  103 (*)  70 - 99 mg/dL   CG4 I-STAT (LACTIC ACID) Status: None    Collection Time    01/31/13 10:48 AM   Result  Value  Range    Lactic Acid, Venous  0.97  0.5 - 2.2 mmol/L   URINALYSIS W MICROSCOPIC + REFLEX CULTURE Status: None    Collection Time    01/31/13 12:15 PM   Result  Value  Range    Color, Urine  YELLOW  YELLOW    APPearance  CLEAR  CLEAR    Specific Gravity, Urine  1.007  1.005 - 1.030    pH  7.5  5.0 - 8.0    Glucose, UA  NEGATIVE  NEGATIVE mg/dL    Hgb urine dipstick  NEGATIVE  NEGATIVE    Bilirubin Urine  NEGATIVE  NEGATIVE    Ketones, ur  NEGATIVE  NEGATIVE mg/dL    Protein, ur  NEGATIVE  NEGATIVE mg/dL    Urobilinogen, UA  1.0  0.0 - 1.0 mg/dL    Nitrite  NEGATIVE  NEGATIVE    Leukocytes, UA  NEGATIVE  NEGATIVE    WBC, UA  0-2  <3 WBC/hpf    Squamous Epithelial / LPF  RARE  RARE   TSH Status: None    Collection Time    01/31/13 6:58 PM   Result  Value  Range    TSH  1.062  0.350 - 4.500 uIU/mL    Comment:  Performed at [facility]   T4, FREE Status: Abnormal    Collection Time    01/31/13 6:58 PM   Result  Value  Range    Free T4  1.94 (*)  0.80 - 1.80 ng/dL    Comment:  Performed at [facility]   TROPONIN I Status: None    Collection Time    01/31/13 6:58 PM   Result  Value  Range    Troponin I  <0.30  <0.30 ng/mL    Comment:      Due to the  release kinetics of cTnI,     a negative result within the first hours     of the onset of symptoms does not rule out     myocardial infarction with certainty.     If myocardial infarction is still suspected,      repeat the test at appropriate intervals.   BASIC METABOLIC PANEL Status: Abnormal    Collection Time    01/31/13 6:58 PM   Result  Value  Range    Sodium  144  135 - 145 mEq/L    Potassium  3.2 (*)  3.5 - 5.1 mEq/L    Chloride  105  96 - 112 mEq/L    CO2  27  19 - 32 mEq/L    Glucose, Bld  69 (*)  70 - 99 mg/dL    BUN  11  6 - 23 mg/dL    Creatinine, Ser  0.45  0.50 - 1.10 mg/dL    Calcium  8.7  8.4 - 10.5 mg/dL    GFR calc non Af Amer  77 (*)  >90 mL/min    GFR calc Af Amer  90 (*)  >90 mL/min    Comment:  (NOTE)     The eGFR has been calculated using the CKD EPI equation.     This calculation has not been validated in all clinical situations.     eGFR's persistently <90 mL/min signify possible Chronic Kidney     Disease.   TROPONIN I Status: None    Collection Time    01/31/13 11:30 PM   Result  Value  Range    Troponin I  <0.30  <0.30 ng/mL    Comment:      Due to the release kinetics of cTnI,     a negative result within the first hours     of the onset of symptoms does not rule out     myocardial infarction with certainty.     If myocardial infarction is still suspected,     repeat the test at appropriate intervals.   BASIC METABOLIC PANEL Status: Abnormal    Collection Time    02/01/13 4:35 AM   Result  Value  Range    Sodium  141  135 - 145 mEq/L    Potassium  3.7  3.5 - 5.1 mEq/L    Chloride  105  96 - 112 mEq/L    CO2  27  19 - 32 mEq/L    Glucose, Bld  93  70 - 99 mg/dL    BUN  12  6 - 23 mg/dL    Creatinine, Ser  4.09  0.50 - 1.10 mg/dL    Calcium  8.7  8.4 - 10.5 mg/dL    GFR calc non Af Amer  68 (*)  >90 mL/min    GFR calc Af Amer  79 (*)  >90 mL/min    Comment:  (NOTE)     The eGFR has been calculated using the CKD EPI equation.     This calculation has not been validated in all clinical situations.     eGFR's persistently <90 mL/min signify possible Chronic Kidney     Disease.   CBC Status: None    Collection Time    02/01/13 4:35 AM   Result   Value  Range    WBC  6.9  4.0 - 10.5 K/uL    RBC  4.52  3.87 - 5.11 MIL/uL    Hemoglobin  13.5  12.0 - 15.0 g/dL    HCT  16.1  09.6 - 04.5 %    MCV  87.6  78.0 - 100.0 fL    MCH  29.9  26.0 - 34.0 pg    MCHC  34.1  30.0 - 36.0 g/dL    RDW  40.9  81.1 - 91.4 %    Platelets  250  150 - 400 K/uL   TROPONIN I Status: None    Collection Time    02/01/13 4:35 AM   Result  Value  Range    Troponin I  <0.30  <0.30 ng/mL    Comment:      Due to the release kinetics of cTnI,     a negative result within the first hours     of the onset of symptoms does not rule out     myocardial infarction with certainty.     If myocardial infarction is still suspected,     repeat the test at appropriate intervals.   PROTIME-INR Status: Abnormal    Collection Time    02/01/13 4:35 AM   Result  Value  Range    Prothrombin Time  16.6 (*)  11.6 - 15.2 seconds    INR  1.38  0.00 - 1.49    Dg Chest 2 View  01/31/2013 *RADIOLOGY REPORT* Clinical Data: Weakness. Shortness of breath. CHEST - 2 VIEW Comparison: Chest x-ray 05/23/2012. Findings: Lung volumes are normal. No consolidative airspace disease. No pleural effusions. No pneumothorax. No pulmonary nodule or mass noted. Pulmonary vasculature and the cardiomediastinal silhouette are within normal limits. Atherosclerosis in the thoracic aorta. Calcified granuloma in the right apex is unchanged. IMPRESSION: 1. No radiographic evidence of acute cardiopulmonary disease. 2. Atherosclerosis. Original Report Authenticated By: Trudie Reed, M.D.   Review of Systems  Constitutional: Positive for malaise/fatigue.  Respiratory: Positive for shortness of breath. Hemoptysis: mild.  Cardiovascular: Negative for chest pain and palpitations.  Gastrointestinal: Positive for constipation.  Neurological: Positive for weakness. Negative for dizziness and loss of consciousness.  All other systems reviewed and are negative.   Blood pressure 89/69, pulse 41, temperature 98.1 F  (36.7 C), temperature source Oral, resp. rate 17, height 5\' 3"  (1.6 m), weight 139 lb 15.9 oz (63.5 kg), SpO2 99.00%.  Physical Exam  Constitutional: She is oriented to person, place, and time. She appears well-developed and well-nourished. No distress.  HENT:  Head: Normocephalic and atraumatic.  Neck: No JVD present. Carotid bruit is not present.  Cardiovascular: An irregularly irregular rhythm present. Bradycardia present. Exam reveals no gallop and no friction rub.  No murmur heard.  Pulses:  Radial pulses are 2+ on the right side, and 2+ on the left side.  Dorsalis pedis pulses are 2+ on the right side, and 2+ on the left side.  Respiratory: Effort normal and breath sounds normal. No respiratory distress. She has no rales.  GI: Soft. Bowel sounds are normal. She exhibits no distension and no mass. There is no tenderness.  Musculoskeletal: She exhibits no edema.  Neurological: She is alert and oriented to person, place, and time.  Skin: Skin is warm and dry. She is not diaphoretic.  Psychiatric: She has a normal mood and affect. Her behavior is normal.   Assessment/Plan:  Principal Problem:  Malaise and fatigue  Active Problems:  Hypokalemia  Bradycardia by electrocardiogram  Atrial fibrillation   Plan: Pt has had significant bradycardia with HRs as low as the 20s. HR currently is in the mid 40s-50s. Current  rhythm is atrial fibrillation. Her blood pressure is stable w/ most recent being 129/42. Bradycardia is likely the cause of her weakness and fatigue. Amiodarone for PAF is now on hold. Temporary pacer pads are in place. Pt will likely benefit from an PPM. Continue on Coumadin. INR is sub therapeutic at 1.38. Goal INR is 2.0-3.0. MD to follow.  Allayne Butcher, PA-C  02/01/2013, 7:25 PM     Pt. Seen and examined. Agree with the NP/PA-C note as written. 79 yo female patient of Dr. Alanda Amass, known to me from a TEE/Cardioversion I performed on her last year. She had been  loaded on amiodarone and maintained sinus, until recently, where she has had marked bradycardia and PAF with slow ventricular response. She appears to be alternating between sinus in the 60's and PAF in the 40-50's. There have been documented HR's in the 20-30 range overnight. She reports no chest pain, but has been dyspneic and very fatigued recently. She was on amiodarone, but no other negative inotropes. This has been held, but has a washout period of at least 2-4 weeks or more. Despite this, there are clear signs of sinus node dysfunction. She has ruled-out for ACS.   Impression:  1. PAF with symptomatic bradycardia  2. Probable sinus node dysfunction   Plan:  1. There appears to be a good indication for pacemaker in this situation. She is clinically stable at this time. Unfortunately, due to the lipophilic properties of amiodarone, the medication will continue to exert effects for some time. I do think she qualifies for pacemaker. Will d/w Dr. Royann Shivers and tentatively plan on Thursday as an inpatient.  Thanks for consulting [place]. We will follow along closely with you.   Chrystie Nose, MD, [facility]  Attending Cardiologist  [facility]

## 2013-02-02 NOTE — Progress Notes (Signed)
Pt. Seen and examined. Agree with the NP/PA-C note as written.  Persistent bradycardia overnight into 20's while in a-fib with 2-3 second pauses. Blood pressure is stable. For pacemaker tomorrow.  Chrystie Nose, MD, [facility] Attending Cardiologist [facility]

## 2013-02-03 ENCOUNTER — Encounter (HOSPITAL_COMMUNITY)
Admission: EM | Disposition: A | Discharge status: Skilled Nursing Facility | Payer: Self-pay | Source: Home / Self Care | Attending: Internal Medicine

## 2013-02-03 HISTORY — PX: PERMANENT PACEMAKER INSERTION: SHX5480

## 2013-02-03 LAB — BASIC METABOLIC PANEL
CO2: 23 mEq/L (ref 19–32)
Calcium: 9.1 mg/dL (ref 8.4–10.5)
Chloride: 109 mEq/L (ref 96–112)
Creatinine, Ser: 0.81 mg/dL (ref 0.50–1.10)
Glucose, Bld: 104 mg/dL — ABNORMAL HIGH (ref 70–99)

## 2013-02-03 LAB — PROTIME-INR: INR: 1.97 — ABNORMAL HIGH (ref 0.00–1.49)

## 2013-02-03 SURGERY — PERMANENT PACEMAKER INSERTION
Anesthesia: Moderate Sedation | Laterality: Left

## 2013-02-03 SURGERY — PERMANENT PACEMAKER INSERTION
Anesthesia: LOCAL

## 2013-02-03 MED ORDER — LORAZEPAM 2 MG/ML IJ SOLN
0.5000 mg | Freq: Once | INTRAMUSCULAR | Status: AC
  Administered 2013-02-03: 0.5 mg via INTRAVENOUS

## 2013-02-03 MED ORDER — VANCOMYCIN HCL IN DEXTROSE 1-5 GM/200ML-% IV SOLN
1000.0000 mg | Freq: Two times a day (BID) | INTRAVENOUS | Status: AC
  Administered 2013-02-03: 17:00:00 1000 mg via INTRAVENOUS
  Filled 2013-02-03: qty 200

## 2013-02-03 MED ORDER — HYDROCODONE-ACETAMINOPHEN 5-325 MG PO TABS: 1.0000 | ORAL_TABLET | ORAL | Status: DC | PRN

## 2013-02-03 MED ORDER — DIAZEPAM 5 MG PO TABS
5.0000 mg | ORAL_TABLET | ORAL | Status: AC
  Administered 2013-02-03: 5 mg via ORAL
  Filled 2013-02-03: qty 1

## 2013-02-03 MED ORDER — SODIUM CHLORIDE 0.9 % IR SOLN
80.0000 mg | Status: DC
  Filled 2013-02-03: qty 2

## 2013-02-03 MED ORDER — SODIUM CHLORIDE 0.9 % IV BOLUS (SEPSIS)
500.0000 mL | Freq: Once | INTRAVENOUS | Status: AC
  Administered 2013-02-03: 23:00:00 500 mL via INTRAVENOUS

## 2013-02-03 MED ORDER — LORAZEPAM 2 MG/ML IJ SOLN
INTRAMUSCULAR | Status: AC
  Administered 2013-02-03: 0.5 mg via INTRAVENOUS
  Filled 2013-02-03: qty 1

## 2013-02-03 MED ORDER — ACETAMINOPHEN 325 MG PO TABS: 325.0000 mg | ORAL_TABLET | ORAL | Status: DC | PRN

## 2013-02-03 MED ORDER — SODIUM CHLORIDE 0.9 % IV SOLN
INTRAVENOUS | Status: AC
Start: 2013-02-03 — End: 2013-02-03
  Administered 2013-02-03: 16:00:00 via INTRAVENOUS

## 2013-02-03 MED ORDER — LIDOCAINE HCL (PF) 1 % IJ SOLN
INTRAMUSCULAR | Status: AC
Start: 2013-02-03 — End: 2013-02-03
  Filled 2013-02-03: qty 60

## 2013-02-03 MED ORDER — MIDAZOLAM HCL 5 MG/5ML IJ SOLN
INTRAMUSCULAR | Status: AC
  Filled 2013-02-03: qty 5

## 2013-02-03 MED ORDER — HEPARIN (PORCINE) IN NACL 2-0.9 UNIT/ML-% IJ SOLN
INTRAMUSCULAR | Status: AC
  Filled 2013-02-03: qty 500

## 2013-02-03 MED ORDER — ONDANSETRON HCL 4 MG/2ML IJ SOLN: 4.0000 mg | Freq: Four times a day (QID) | INTRAMUSCULAR | Status: DC | PRN

## 2013-02-03 MED ORDER — VANCOMYCIN HCL IN DEXTROSE 1-5 GM/200ML-% IV SOLN
1000.0000 mg | INTRAVENOUS | Status: DC
  Filled 2013-02-03: qty 200

## 2013-02-03 MED ORDER — HALOPERIDOL LACTATE 5 MG/ML IJ SOLN
INTRAMUSCULAR | Status: AC
  Administered 2013-02-03: 2 mg
  Filled 2013-02-03: qty 1

## 2013-02-03 MED ORDER — MIRTAZAPINE 15 MG PO TABS
15.0000 mg | ORAL_TABLET | Freq: Every day | ORAL | Status: DC
  Administered 2013-02-03: 15 mg via ORAL
  Filled 2013-02-03 (×3): qty 1

## 2013-02-03 NOTE — Progress Notes (Signed)
ANTICOAGULATION CONSULT NOTE - follow up  Pharmacy Consult for Coumadin Indication: atrial fibrillation  Allergies  Allergen Reactions  . Amoxicillin     Can't remember reaction  . Doxycycline     Can't remember reaction  . Penicillins     Can't remember reaction  . Sulfa Antibiotics     Can't remember reaction    Vital Signs: Temp: 98.2 F (36.8 C) (09/04 0800) Temp src: Oral (09/04 0800) BP: 141/81 mmHg (09/04 0855)  Labs:  Recent Labs  01/31/13 1027 01/31/13 1858 01/31/13 2330 02/01/13 0435 02/02/13 0400 02/03/13 0450  HGB 14.7  --   --  13.5  --   --   HCT 42.7  --   --  39.6  --   --   PLT 262  --   --  250  --   --   LABPROT 16.9*  --   --  16.6* 17.9* 21.8*  INR 1.41  --   --  1.38 1.52* 1.97*  CREATININE 0.89 0.78  --  0.80 0.87 0.81  TROPONINI <0.30 <0.30 <0.30 <0.30  --   --     Estimated Creatinine Clearance: 50.5 ml/min (by C-G formula based on Cr of 0.81).   Assessment: 79 yo female admitted with generalized weakness and malaise over the last few days. She was prescribed a 10 day course of Cipro for a UTI and is on day 3 according to the PTA medication list. She also recently started mirtazapine for sleep.  Pharmacy consulted to manage Coumadin for Afib. She is s/p DCCV in December. INR was subtherapeutic at 1.41 on admission. No bleeding noted, CBC is normal.  Home Coumadin dose is 2.5 mg daily for 3 days then skip a day and repeat.   Today INR is 1.97 after 3 doses of 4 mg.  To get PPM today.  Dr. Jomarie Longs requests no coumadin tonight due to sharp rise in INR and concern for possible packemaker pocket hematoma.  Goal of Therapy:  INR 2-3 Monitor platelets by anticoagulation protocol: Yes   Plan:  - No coumadin tonight per Dr. Claudette Laws request - INR daily - Monitor for s/sx of bleeding  - she is on heparin 5 K sq q8h until INR tx  Herby Abraham, Pharm.D. [phone] 02/03/2013 10:08 AM

## 2013-02-03 NOTE — Progress Notes (Signed)
Pt arrived to 4E16 from cath lab post pacer placement.  Pt placed on tele and currently A-paced.  Pt has been oriented to unit and appears in no acute distress.  Bed alarm and camera have been turned on for pt safety.  Pt resting comfortably in bed with call bell within reach.  Admission orders being reviewed by receiving RN at this time. Nino Glow RN

## 2013-02-03 NOTE — Progress Notes (Signed)
Patient SBP dropping into 70's on dinamap, 84/48 manual recheck. Patient alert, oriented and asymptomatic.  Dr Terressa Koyanagi notified and received order for 500cc NS Bolus.  Will continue to monitor. Blood pressure 84/48, pulse 68, temperature 97.4 F (36.3 C), temperature source Oral, resp. rate 18, height 5\' 3"  (1.6 m), weight 61.871 kg (136 lb 6.4 oz), SpO2 97.00%. [name]

## 2013-02-03 NOTE — Progress Notes (Signed)
[facility]  Subjective: No complaints. RN reports that patient was agitated and combative last PM. She improved after haldol and ativan. She is resting comfortably now.   Objective: Vital signs in last 24 hours: Temp:  [97.2 F (36.2 C)-97.8 F (36.6 C)] 97.6 F (36.4 C) (09/03 2137) Pulse Rate:  [36-48] 48 (09/03 1235) Resp:  [12-22] 15 (09/04 0600) BP: (107-168)/(53-98) 130/70 mmHg (09/04 0511) SpO2:  [95 %-100 %] 97 % (09/04 0600) Last BM Date: 02/02/13  Intake/Output from previous day: 09/03 0701 - 09/04 0700 In: 966 [P.O.:960; I.V.:6] Out: 1400 [Urine:1400] Intake/Output this shift:    Medications Current Facility-Administered Medications  Medication Dose Route Frequency Provider Last Rate Last Dose  . 0.9 %  sodium chloride infusion   Intravenous Continuous Chrystie Nose, MD      . aspirin EC tablet 81 mg  81 mg Oral Daily Genelle Gather, MD   81 mg at 02/02/13 0955  . chlorhexidine (HIBICLENS) 4 % liquid 4 application  60 mL Topical Once Chrystie Nose, MD      . diazepam (VALIUM) tablet 5 mg  5 mg Oral On Call Chrystie Nose, MD      . docusate sodium (COLACE) capsule 100 mg  100 mg Oral BID Genelle Gather, MD   100 mg at 02/01/13 2141  . furosemide (LASIX) tablet 40 mg  40 mg Oral Daily Genelle Gather, MD   40 mg at 02/02/13 0955  . gentamicin (GARAMYCIN) 80 mg in sodium chloride irrigation 0.9 % 500 mL irrigation  80 mg Irrigation On Call Chrystie Nose, MD      . heparin injection 5,000 Units  5,000 Units Subcutaneous Q8H Genelle Gather, MD   5,000 Units at 02/02/13 2131  . losartan (COZAAR) tablet 50 mg  50 mg Oral Daily Genelle Gather, MD   50 mg at 02/02/13 0955  . polyethylene glycol (MIRALAX / GLYCOLAX) packet 17 g  17 g Oral Daily Genelle Gather, MD   17 g at 02/01/13 0925  . sodium chloride 0.9 % injection 3 mL  3 mL Intravenous Q12H Genelle Gather, MD   3 mL at 02/02/13 2200  . vancomycin (VANCOCIN) IVPB 1000  mg/200 mL premix  1,000 mg Intravenous On Call Chrystie Nose, MD      . Warfarin - Pharmacist Dosing Inpatient   Does not apply q1800 [facility]        PE: General appearance: alert, cooperative and no distress Lungs: clear to auscultation bilaterally Heart: regular rate and rhythm Extremities: no LEE Pulses: 2+ and symmetric Skin: warm and dry Neurologic: Grossly normal  Lab Results:   Recent Labs  01/31/13 1027 02/01/13 0435  WBC 6.8 6.9  HGB 14.7 13.5  HCT 42.7 39.6  PLT 262 250   BMET  Recent Labs  02/01/13 0435 02/02/13 0400 02/03/13 0450  NA 141 144 144  K 3.7 4.1 3.9  CL 105 108 109  CO2 27 29 23   GLUCOSE 93 97 104*  BUN 12 15 14   CREATININE 0.80 0.87 0.81  CALCIUM 8.7 8.7 9.1   PT/INR  Recent Labs  02/01/13 0435 02/02/13 0400 02/03/13 0450  LABPROT 16.6* 17.9* 21.8*  INR 1.38 1.52* 1.97*   Assessment/Plan  Principal Problem:   Symptomatic bradycardia Active Problems:   Hypokalemia   Pulmonary fibrosis   Malaise and fatigue   Atrial fibrillation with slow ventricular response  Sinus node dysfunction  Plan: Pt continues to be bradycardiac w/ HR ranging in the 30s-50s. BP is stable. Plan is for PPM insertion today. Warfarin has not been held. INR today is <2.0 at 1.97. May need to hold tonight's dose. RN reports that patient was very agitated and combative last PM. She pulled out IVs. There is concern about post-operative compliance, in regards to restrictions/limitations to reduce risk of lead dislodgement. Pt will likely need sedatives both during and post procedure. Dr. Royann Shivers to assess and will determine final plan. Keep NPO.     LOS: 3 days    Brittainy M. Delmer Islam 02/03/2013 7:47 AM  I have seen and examined the patient along with Brittainy M. Sharol Harness, PA-C.  I have reviewed the chart, notes and new data.  I agree with PA's note.  Key new complaints: quiet today, not overly friendly, but she is cooperative  and appears to be fully oriented. Key examination changes: in NSR at about 60 bpm Key new findings / data: QT is now completely normal. K normal  PLAN: Pacemaker today. Spent a long time discussing the purpose of the device, implantation procedure details, possible complications, postop activity restrictions and wound care, long term follow up, etc. She appears to comprehend and provided consent. Restart remeron - her combative behavior might be related to abrupt cessation of this medication, as well as some benzodiazepine related disorientation. Will also probably resume amiodarone once the PM is in Hold warfarin today. Her INR has risen sharply over the last 2 days and might overshoot, with bleeding complications at the surgical site.  This procedure has been fully reviewed with the patient and written informed consent has been obtained.   Thurmon Fair, MD, [facility] [phone] 02/03/2013, 8:50 AM

## 2013-02-03 NOTE — Progress Notes (Addendum)
Dr. Royann Shivers notified of pt complaining that she needs to void, but only voids 15-30cc at a time.  Bladder scan indicates at least 600 cc of urine in the bladder. Requested order for in and out cath prior to pacemaker insertion.  Order received to place foley catheter in which will be discontinued after the procedure.

## 2013-02-03 NOTE — Op Note (Signed)
Procedure report  Procedure performed:  1. Implantation of new dual chamber permanent pacemaker 2. Fluoroscopy 3. Light sedation  Reason for procedure: Symptomatic bradycardia due to: Sinus node dysfunction Sinus arrest  Tachycardia-bradycardia syndrome Bradycardia due to necessary medications Paroxysmal atrial fibrillation  Procedure performed by: Thurmon Fair, MD  Complications: None  Estimated blood loss: <10 mL  Medications administered during procedure: Vancomycin 1 g intravenously Lidocaine 1% 30 mL locally,  Versed 1 mg intravenously  Device details: Generator Medtronic Adapta L model ADDRL 1 serial number [id number] H Right atrial lead Medtronic [id number] serial number [id number] Right ventricular lead Medtronic [id number] serial number [id number]  Procedure details:  After the risks and benefits of the procedure were discussed the patient provided informed consent and was brought to the cardiac cath lab in the fasting state. The patient was prepped and draped in usual sterile fashion. Local anesthesia with 1% lidocaine was administered to to the left infraclavicular area. A 5-6 cm horizontal incision was made parallel with and 2-3 cm caudal to the left clavicle. Using electrocautery and blunt dissection a prepectoral pocket was created down to the level of the pectoralis major muscle fascia. The pocket was carefully inspected for hemostasis. An antibiotic-soaked sponge was placed in the pocket.  Under fluoroscopic guidance and using the modified Seldinger technique 2 separate venipunctures were performed to access the left subclavian vein. No difficulty was encountered accessing the vein.  Two J-tip guidewires were subsequently exchanged for two 7 French safe sheaths.  Under fluoroscopic guidance the ventricular lead was advanced to level of the mid to apical right ventricular septum and thet active-fixation helix was deployed. Prominent current of injury was seen.  Satisfactory pacing and sensing parameters were recorded. There was no evidence of diaphragmatic stimulation at maximum device output. The safe sheath was peeled away and the lead was secured in place with 2-0 silk.  In similar fashion the right atrial lead was advanced to the level of the atrial appendage. The active-fixation helix was deployed. There was prominent current of injury. Satisfactory  pacing and sensing parameters were recorded. There was no evidence of diaphragmatic stimulation with pacing at maximum device output. The safe sheath was peeled away and the lead was secured in place with 2-0 silk.  The antibiotic-soaked sponge was removed from the pocket. The pocket was flushed with copious amounts of antibiotic solution. Reinspection showed excellent hemostasis..  The ventricular lead was connected to the generator and appropriate ventricular pacing was seen. Subsequently the atrial lead was also connected. Repeat testing of the lead parameters later showed excellent values.  The entire system was then carefully inserted in the pocket with care been taking that the leads and device assumed a comfortable position without pressure on the incision. Great care was taken that the leads be located deep to the generator. The pocket was then closed in layers using 2 layers of 2-0 Vicryl and cutaneous staples, after which a sterile dressing was applied.  At the end of the procedure the following lead parameters were encountered:  Right atrial lead  sensed P waves 2.1 mV, impedance 860 ohms, threshold 0.5 V at 0.5 ms pulse width.  Right ventricular lead sensed R waves 14.9 mV, impedance 1162ohms, threshold 1.2 V at 0.5 ms pulse width.   Thurmon Fair, MD, [facility] [phone] office [phone] pager

## 2013-02-03 NOTE — Progress Notes (Signed)
Pt hollaring out  "where is preston"  Hitting at nurses and spitting on nurses.  Unable to get pt to stay in bed.  Pulled iv out of left arm.   Dr. Tresa Endo on unit.  Okay to give haldol.  Giving haldol 1 mg.  No response.  Pt extremely combative.  Repeated dose after 10 min.  Pt still combative and hollaring out.  Dr. Tresa Endo at bedside.  Ok to give 0.5mg  ativan one time iv.

## 2013-02-03 NOTE — Plan of Care (Signed)
Problem: Phase I Progression Outcomes Goal: No Coumadin, ASA, or antiplatelet meds/48 hrs Outcome: Not Met (add Reason) Coumadin not held prior to procedure however INR is less than 2.

## 2013-02-03 NOTE — Progress Notes (Signed)
Pt resting quietly at present time.  Pulse ox placed on finger.  sats upper 90's./  Continue to monitor pt.

## 2013-02-04 ENCOUNTER — Encounter (HOSPITAL_COMMUNITY): Payer: Self-pay | Admitting: Cardiology

## 2013-02-04 ENCOUNTER — Inpatient Hospital Stay (HOSPITAL_COMMUNITY): Payer: Medicare Other

## 2013-02-04 DIAGNOSIS — I959 Hypotension, unspecified: Secondary | ICD-10-CM

## 2013-02-04 DIAGNOSIS — Z950 Presence of cardiac pacemaker: Secondary | ICD-10-CM

## 2013-02-04 HISTORY — DX: Presence of cardiac pacemaker: Z95.0

## 2013-02-04 MED ORDER — WARFARIN - PHARMACIST DOSING INPATIENT: Freq: Every day | Status: DC

## 2013-02-04 MED ORDER — WARFARIN SODIUM 2.5 MG PO TABS
2.5000 mg | ORAL_TABLET | Freq: Once | ORAL | Status: DC
  Filled 2013-02-04: qty 1

## 2013-02-04 MED ORDER — DSS 100 MG PO CAPS: 100.0000 mg | ORAL_CAPSULE | Freq: Two times a day (BID) | ORAL | Status: DC

## 2013-02-04 MED ORDER — POLYETHYLENE GLYCOL 3350 17 G PO PACK: 17.0000 g | PACK | Freq: Every day | ORAL | Status: DC | PRN

## 2013-02-04 MED ORDER — HYDROCODONE-ACETAMINOPHEN 5-325 MG PO TABS: 1.0000 | ORAL_TABLET | ORAL | Status: DC | PRN

## 2013-02-04 NOTE — Progress Notes (Signed)
Subjective: Urinary retention, hypotensive given fluid bolus This AM asking what happened to her. Did not realize she rec'd pacemaker.    Objective: Vital signs in last 24 hours: Temp:  [97.4 F (36.3 C)-98.3 F (36.8 C)] 98.3 F (36.8 C) (09/05 0556) Pulse Rate:  [56-81] 60 (09/05 0556) Resp:  [12-19] 18 (09/05 0556) BP: (78-141)/(46-81) 130/61 mmHg (09/05 0556) SpO2:  [95 %-97 %] 95 % (09/05 0556) Weight:  [136 lb 3.9 oz (61.8 kg)-136 lb 6.4 oz (61.871 kg)] 136 lb 3.9 oz (61.8 kg) (09/05 0556) Weight change:  Last BM Date: 02/03/13 Intake/Output from previous day: 09/04 0701 - 09/05 0700 In: 1277.5 [P.O.:570; I.V.:207.5; IV Piggyback:500] Out: 2045 [Urine:2045] Intake/Output this shift:    PE: General:Pleasant affect, NAD, somewhat confused about what has happened Skin:Warm and dry, brisk capillary refill HEENT:normocephalic, sclera clear, mucus membranes moist Heart:S1S2 RRR without murmur, gallup, rub or click Chest wall pacer site:  Mild swelling, old blood on dressing. Lungs:clear, though diminished without rales, rhonchi, or wheezes ZOX:WRUE, non tender, + BS, do not palpate liver spleen or masses Ext:no lower ext edema, 2+ pedal pulses, 2+ radial pulses Neuro:alert and oriented, MAE, follows commands, + facial symmetry   Lab Results: No results found for this basename: WBC, HGB, HCT, PLT,  in the last 72 hours BMET  Recent Labs  02/02/13 0400 02/03/13 0450  NA 144 144  K 4.1 3.9  CL 108 109  CO2 29 23  GLUCOSE 97 104*  BUN 15 14  CREATININE 0.87 0.81  CALCIUM 8.7 9.1     Lab Results  Component Value Date   TSH 1.062 01/31/2013      Studies/Results: Dg Chest 2 View  02/04/2013   *RADIOLOGY REPORT*  Clinical Data: Post pacemaker insertion.  CHEST - 2 VIEW  Comparison: 01/31/2013  Findings: A left dual lead cardiac pacemaker has been placed.  The leads are in the region of the right ventricle and right atrium. Negative for a pneumothorax.  Lungs  are clear without airspace disease or pulmonary edema.  Retrocardiac density is suggestive for a small hiatal hernia.  There are atherosclerotic calcifications at the aortic arch.  Heart size is normal.  IMPRESSION: Placement of left chest cardiac pacemaker.  Negative for pneumothorax.  No acute chest findings.   Original Report Authenticated By: Richarda Overlie, M.D.   S/P PPM  02/03/13 Tachycardia-bradycardia syndrome  Bradycardia due to necessary medications  Paroxysmal atrial fibrillation Device details:  Generator Medtronic Adapta L model ADDRL 1 serial number [id number] H  Right atrial lead Medtronic [id number] serial number [id number]  Right ventricular lead Medtronic [id number] serial number [id number]  Medications: I have reviewed the patient's current medications. Scheduled Meds: . aspirin EC  81 mg Oral Daily  . docusate sodium  100 mg Oral BID  . furosemide  40 mg Oral Daily  . heparin  5,000 Units Subcutaneous Q8H  . losartan  50 mg Oral Daily  . mirtazapine  15 mg Oral QHS  . polyethylene glycol  17 g Oral Daily  . sodium chloride  3 mL Intravenous Q12H   Continuous Infusions:  PRN Meds:.acetaminophen, HYDROcodone-acetaminophen, ondansetron (ZOFRAN) IV  Assessment/Plan: Principal Problem:   Symptomatic bradycardia Active Problems:   Hypokalemia   Pulmonary fibrosis   Malaise and fatigue   Atrial fibrillation with slow ventricular response   Sinus node dysfunction   S/P cardiac pacemaker procedure, 02/03/13, Medtronic pacer  PLAN: Hypotensive last pm fluid bolus given, BP improved this AM, no  pneumo on CXR.  Pacing with long 1st degree AV block.  Parameters stable. Will have cardiac rehab ambulate.  Need to make sure she is voiding.   Possible discharge today, follow up with APP for pacer check then Dr. Alanda Amass.  LOS: 4 days    [facility] R  Nurse Practitioner Certified Pager [phone] 02/04/2013, 8:16 AM

## 2013-02-04 NOTE — Progress Notes (Addendum)
CARDIAC REHAB PHASE I   PRE:  Rate/Rhythm: 69 pcaing    BP: sitting 122/71    SaO2:   MODE:  Ambulation: 380 ft   POST:  Rate/Rhythm: 95 pacing    BP: sitting 108/70 after 4 min     SaO2:    Pt not aware of events. Could remember after given verbal reminders. Pt not compliant with arm restrictions. Secured sling for pt. Upon standing right foot slipped on floor before standing. Pt steady walking but did not seem to see obstacles in hall. Only c/o was weakness. BP lower after walk. To recliner. Reminded pt of restrictions. She remembers these things after prompting. Do not feel she could remember on her own (after walk she stated she had not walked and then remembered with prompting). Do not feel pt would be safe by herself. Needs someone to stay with her. If this could not be arranged, she will need SNF. Will need PT c/s to obtain SNF. Her husband is living at [facility]. Notified RN. Set up pacer video for pt. [phone]  [name] CES, ACSM 02/04/2013 10:18 AM

## 2013-02-04 NOTE — Progress Notes (Signed)
Patient had foley d/c 02/03/13 at approximately 1800. Patient had one episode of incontinence at approximately 2300 on  02/03/13.  As of this morning, patient unable to void.  Bladder scan showed 168cc.  MD on call notified-will continue to monitor. Troy Sine

## 2013-02-04 NOTE — Progress Notes (Signed)
Pt. Seen and examined. Agree with the NP/PA-C note as written.  Doing well post-pacemaker. Some hypotension this am, responded to IV fluids. Interrogation shows appropriate thresholds. CXR is without PTX. Medically, she is stable, however, she has limited use of her arm. She was living at home alone with her dog. Her husband is in [facility] nursing home and her son works full-time and cannot take care of her. She does appear deconditioned and will likely need a SNF stay. I have contacted social work - will ask for formal PT evaluation as well.  Chrystie Nose, MD, [facility] Attending Cardiologist [facility]

## 2013-02-04 NOTE — Discharge Summary (Signed)
Physician Discharge Summary      Patient ID: [name] MRN: [id number] DOB/AGE: 12-06-1931 79 y.o.  Admit date: 01/31/2013 Discharge date: 02/04/2013  Discharge Diagnoses:  Principal Problem:   Symptomatic bradycardia Active Problems:   Hypokalemia   Pulmonary fibrosis   Malaise and fatigue   Atrial fibrillation with slow ventricular response   Sinus node dysfunction   S/P cardiac pacemaker procedure, 02/03/13, Medtronic pacer   Discharged Condition: fair  Procedures: 02/03/13 placement of PPM by Dr. Royann Shivers  Generator Medtronic Adapta L model ADDRL 1 serial number [id number] H  Right atrial lead Medtronic [id number] serial number [id number]  Right ventricular lead Medtronic [id number] serial number [id number]  Wear Lt arm sling to remind pt not to raise Lt arm. Remove pacer dressing on Sunday. PT/INR on Monday 02/07/13   Hospital Course: [phone] WF with h/o A fib (on coumadin s/p cardioversion Dec 2013), diastolic CHF (last Echo Dec 2013 EF 55-60%), pulmonary fibrosis, HTN who presented 01/31/13 with c/o generalized weakness and malaise that developed over the past few days. Patient reported that she had dysuria and increased urinary frequency that began 1 week prior to admit and was seen by her PCP (Dr. Jeannetta Nap)  where she was prescribed a 10 day course of cipro, she is on day 5. PCP also prescribed remeron during this office visit to help with patient's sleep. Her husband is in a nursing home and is not doing well, so recently her mind has been racing at night, which inhibits sleep. She had been taking the remeron since that visit. Patient reported generalized malaise for the past few days reporting "I feel like I am going to collapse." Denies CP, SOB, heart palpitations, cough, lightheadedness, dizziness, syncope or any LOC, N/V/D, abdominal pain, weight loss or gain, BLE edema. Her urinary symptoms have completely resolved. Her appetite has been good and per son, she has been eating and  drinking normally.  In the ED, patient had CMP that was significant for K 3.0, CBC wnl, lactic acid wnl, troponin neg x 1, INR subtherapeutic at 1.4. UA was negative. CXR showed no acute process. EKG showed sinus bradycardia (59 bpm), QT prolongation (QTc 538), long PR interval (231), no ischemic changes noted. IVF NS at 100cc/hr. ED gave her KDur .   Pt was admitted and HR continued to be low, at times in the 20s.  Amiodarone was held.  Coumadin was held.  She underwent PPM 02/03/13 without complications.    By 02/04/13, she was unsure what type of surgery she had.  It was explained.  Pacer site with old blood and mild swelling.  Coumadin has been restarted and will need INR on Monday. Will resume Amiodarone. She is currently maintaining SR with pacing.  Please note she has been confused at times.  Also she lives alone and her husband is a resident at [facility]. She has trouble not being able to use Lt arm due to pacer.  We believe she needs SNF for rehab.    She will follow up in the office in 1 week for pacer site check and remove staples.  Remove Pacer dressing in 2 days.  Wear sling to remind her not to raise arm.    She will need PT/INR on Monday the 02/07/13.  She will need physical therapy.  Consults: cardiology  Significant Diagnostic Studies:  BMET    Component Value Date/Time   NA 144 02/03/2013 0450   K 3.9 02/03/2013 0450   CL 109 02/03/2013 0450  CO2 23 02/03/2013 0450   GLUCOSE 104* 02/03/2013 0450   BUN 14 02/03/2013 0450   CREATININE 0.81 02/03/2013 0450   CALCIUM 9.1 02/03/2013 0450   GFRNONAA 67* 02/03/2013 0450   GFRAA 78* 02/03/2013 0450    CBC    Component Value Date/Time   WBC 6.9 02/01/2013 0435   RBC 4.52 02/01/2013 0435   HGB 13.5 02/01/2013 0435   HCT 39.6 02/01/2013 0435   PLT 250 02/01/2013 0435   MCV 87.6 02/01/2013 0435   MCH 29.9 02/01/2013 0435   MCHC 34.1 02/01/2013 0435   RDW 14.2 02/01/2013 0435   LYMPHSABS 1.1 01/31/2013 1027   MONOABS 0.8 01/31/2013 1027   EOSABS 0.1 01/31/2013 1027     BASOSABS 0.0 01/31/2013 1027   INR at discharge 2.14.  Resume coumadin on Saturday.  Follow Pro times closely.    u/a clear  CHEST - 2 VIEW Comparison: 01/31/2013 Findings: A left dual lead cardiac pacemaker has been placed. The leads are in the region of the right ventricle and right atrium. Negative for a pneumothorax. Lungs are clear without airspace disease or pulmonary edema. Retrocardiac density is suggestive for a small hiatal hernia. There are atherosclerotic calcifications at the aortic arch. Heart size is normal.  IMPRESSION: Placement of left chest cardiac pacemaker. Negative for Pneumothorax.  No acute chest findings  Discharge Exam: Blood pressure 126/66, pulse 80, temperature 97.4 F (36.3 C), temperature source Oral, resp. rate 18, height 5\' 3"  (1.6 m), weight 136 lb 3.9 oz (61.8 kg), SpO2 98.00%.  AM Exam:   PE: General:Pleasant affect, NAD, somewhat confused about what has happened  Skin:Warm and dry, brisk capillary refill  HEENT:normocephalic, sclera clear, mucus membranes moist  Heart:S1S2 RRR without murmur, gallup, rub or click  Chest wall pacer site: Mild swelling, old blood on dressing.  Lungs:clear, though diminished without rales, rhonchi, or wheezes  ZOX:WRUE, non tender, + BS, do not palpate liver spleen or masses  Ext:no lower ext edema, 2+ pedal pulses, 2+ radial pulses  Neuro:alert and oriented, MAE, follows commands, + facial symmetry  Disposition: 03-Skilled Nursing Facility       Future Appointments Provider Department Dept Phone   02/14/2013 2:20 PM Nada Boozer, NP [facility] [place] [phone]   02/18/2013 8:15 AM Sherrie George, MD [facility] [phone]       Medication List    STOP taking these medications       ciprofloxacin 750 MG tablet  Commonly known as:  CIPRO      TAKE these medications       acetaminophen 500 MG tablet  Commonly known as:  TYLENOL  Take 500  mg by mouth at bedtime as needed. For pain to help sleep     amiodarone 200 MG tablet  Commonly known as:  PACERONE  Take 200 mg by mouth daily.     calcium-vitamin D 500-200 MG-UNIT per tablet  Commonly known as:  OSCAL WITH D  Take 1 tablet by mouth.     DSS 100 MG Caps  Take 100 mg by mouth 2 (two) times daily.     FLUoxetine 20 MG capsule  Commonly known as:  PROZAC  Take 20 mg by mouth daily.     furosemide 40 MG tablet  Commonly known as:  LASIX  Take 40 mg by mouth daily.     HYDROcodone-acetaminophen 5-325 MG per tablet  Commonly known as:  NORCO/VICODIN  Take 1-2 tablets by  mouth every 4 (four) hours as needed for pain (at pacemaker site).     losartan 50 MG tablet  Commonly known as:  COZAAR  Take 50 mg by mouth daily.     mirtazapine 15 MG tablet  Commonly known as:  REMERON  Take 15 mg by mouth at bedtime.     polyethylene glycol packet  Commonly known as:  MIRALAX / GLYCOLAX  Take 17 g by mouth daily as needed.     warfarin 5 MG tablet  Commonly known as:  COUMADIN  Take 2.5 mg by mouth See admin instructions. Take 1/2 tablet for three days then skip a day and repeat until INR is checked again next week.       Follow-up Information   Follow up with [facility] R, NP On 02/14/2013. (at 2:20 pm)    Specialty:  Cardiology   Contact information:   [address] [place] [address] [phone]       Signed: Leone Brand Nurse Practitioner-Certified [facility] 02/04/2013, 3:27 PM  Time spent on discharge :40 minutes.

## 2013-02-04 NOTE — Evaluation (Addendum)
Physical Therapy Evaluation Patient Details Name: [name] MRN: [id number] DOB: 01-13-1932 Today's Date: 02/04/2013 Time: 4098-1191 PT Time Calculation (min): 11 min  PT Assessment / Plan / Recommendation History of Present Illness  Pt adm with bradycardia and underwent pacer placement 02/03/13.  Clinical Impression  Pt admitted with above. Pt currently with functional limitations due to the deficits listed below (see PT Problem List).  Pt will benefit from skilled PT to increase their independence and safety with mobility to allow discharge to the venue listed below.       PT Assessment  Patient needs continued PT services    Follow Up Recommendations  SNF    Does the patient have the potential to tolerate intense rehabilitation      Barriers to Discharge Decreased caregiver support      Equipment Recommendations  None recommended by PT    Recommendations for Other Services     Frequency Min 3X/week    Precautions / Restrictions Precautions Precautions: Fall;ICD/Pacemaker   Pertinent Vitals/Pain See flow sheet      Mobility  Bed Mobility Bed Mobility: Sit to Supine;Supine to Sit;Sitting - Scoot to Edge of Bed Supine to Sit: 6: Modified independent (Device/Increase time);HOB elevated Sitting - Scoot to Edge of Bed: 6: Modified independent (Device/Increase time) Sit to Supine: 6: Modified independent (Device/Increase time);HOB elevated Transfers Transfers: Sit to Stand;Stand to Sit Sit to Stand: 4: Min assist;With upper extremity assist;From bed Stand to Sit: 4: Min assist;With upper extremity assist;To bed Ambulation/Gait Ambulation/Gait Assistance: 4: Min assist Ambulation Distance (Feet): 150 Feet Assistive device: None Ambulation/Gait Assistance Details: Pt unsteady with gait and needed assist to maintain balance. Gait Pattern: Step-through pattern;Decreased stride length;Narrow base of support Gait velocity: 1.45 ft/sec which places pt at high recurrent  fall risk.    Exercises     PT Diagnosis: Difficulty walking;Generalized weakness  PT Problem List: Decreased strength;Decreased balance;Decreased mobility;Decreased cognition;Decreased activity tolerance;Decreased safety awareness;Decreased knowledge of precautions;Decreased knowledge of use of DME PT Treatment Interventions: DME instruction;Gait training;Functional mobility training;Therapeutic activities;Therapeutic exercise;Balance training;Patient/family education     PT Goals(Current goals can be found in the care plan section) Acute Rehab PT Goals Patient Stated Goal: Pt agreeable to ST-SNF to get stronger. PT Goal Formulation: With patient Time For Goal Achievement: 02/11/13 Potential to Achieve Goals: Good Additional Goals Additional Goal #1: Pt will score >19/26 on DGI  Visit Information  Last PT Received On: 02/04/13 Assistance Needed: +1 History of Present Illness: Pt adm with bradycardia and underwent pacer placement 02/03/13.       Prior Functioning  Home Living Family/patient expects to be discharged to:: Private residence Living Arrangements: Alone Available Help at Discharge: Family Type of Home: House Home Access: Stairs to enter Secretary/administrator of Steps: 4+ Entrance Stairs-Rails: Right;Left Home Layout: Two level;Able to live on main level with bedroom/bathroom Home Equipment: Dan Humphreys - 2 wheels;Shower seat;Bedside commode Prior Function Level of Independence: Independent Communication Communication: No difficulties Dominant Hand: Right    Cognition  Cognition Arousal/Alertness: Awake/alert Behavior During Therapy: WFL for tasks assessed/performed Overall Cognitive Status: Impaired/Different from baseline Area of Impairment: Memory;Orientation Orientation Level: Disoriented to;Situation Memory: Decreased short-term memory;Decreased recall of precautions    Extremity/Trunk Assessment Lower Extremity Assessment Lower Extremity Assessment:  Generalized weakness   Balance Balance Balance Assessed: Yes Static Standing Balance Static Standing - Balance Support: No upper extremity supported Static Standing - Level of Assistance: 4: Min assist  End of Session PT - End of Session Activity Tolerance: Patient tolerated  treatment well Patient left: in bed;with call bell/phone within reach;with bed alarm set Nurse Communication: Mobility status  GP     [facility] 02/04/2013, 2:15 PM  [facility] PT [phone]

## 2013-02-04 NOTE — Progress Notes (Signed)
ANTICOAGULATION CONSULT NOTE - follow up  Pharmacy Consult for Coumadin Indication: atrial fibrillation  Allergies  Allergen Reactions  . Amoxicillin     Can't remember reaction  . Doxycycline     Can't remember reaction  . Penicillins     Can't remember reaction  . Sulfa Antibiotics     Can't remember reaction    Vital Signs: Temp: 98.3 F (36.8 C) (09/05 0556) Temp src: Oral (09/05 0556) BP: 115/70 mmHg (09/05 1050) Pulse Rate: 72 (09/05 1050)  Labs:  Recent Labs  02/02/13 0400 02/03/13 0450 02/04/13 0630  LABPROT 17.9* 21.8* 23.2*  INR 1.52* 1.97* 2.14*  CREATININE 0.87 0.81  --     Estimated Creatinine Clearance: 46.6 ml/min (by C-G formula based on Cr of 0.81).   Assessment: 79 yo female on Coumadin for Afib. S/p PPM insertion yesterday, INR (2.14) is therapeutic although coumadin was on hold yesterday. No bleeding noted per chart.    Home Coumadin dose is 2.5 mg daily for 3 days then skip a day and repeat.   Goal of Therapy:  INR 2-3 Monitor platelets by anticoagulation protocol: Yes   Plan:  - repeat Coumadin 2.5 mg PO tonight - INR daily - Monitor for s/sx of bleeding  - D/C sq heparin since INR is therapeutic  Bayard Hugger, PharmD, BCPS  Clinical Pharmacist  Pager: [phone]  02/04/2013 11:17 AM

## 2013-02-04 NOTE — Progress Notes (Signed)
IV d/c'd.  Tele d/c'd.  Pt d/c'd to SNF.  Home meds and d/c instructions have been discussed with pt and pt son.  Both deny any questions or concerns at this time.  Report has been called to receiving nurse at facility.  Nurse also denies any questions or concerns at this time.  Pt being d/c'd via wheelchair transport and will be taken to facility via son private vehicle.  Pt leaving unit escorted by RN and appears in no acute distress. Nino Glow RN

## 2013-02-11 ENCOUNTER — Encounter: Payer: Self-pay | Admitting: *Deleted

## 2013-02-13 NOTE — Clinical Social Work Psychosocial (Addendum)
    Clinical Social Work Department BRIEF PSYCHOSOCIAL ASSESSMENT 02/13/2013  Patient:  [name],[name]     Account Number:  [id number]     Admit date:  01/31/2013  Clinical Social Worker:  Tiburcio Pea  Date/Time:  02/04/2013 02:30 PM  Referred by:  Physician  Date Referred:  02/04/2013  Other Referral:   Interview type:  Other - See comment Other interview type:   Patient and her son [name]    PSYCHOSOCIAL DATA Living Status:  ALONE Admitted from facility:   Level of care:   Primary support name:  Bosie Helper Primary support relationship to patient:  CHILD, ADULT Degree of support available:   Strong Support  Husband is [name] new patient at [facility] [employer]    CURRENT CONCERNS Current Concerns  Post-Acute Placement   Other Concerns:    SOCIAL WORK ASSESSMENT / PLAN 79 year old female who was admitted from home where she had recently been staying by herself after her husband was placed at [facility].  Patient had intended to return home but when MD stated she was ready for d/c she decided she was not going to be able to manage at home alone. She was evaluated by PT and determined that she would benefit from short term SNF. She now requests short term SNF. Fl2 signed by MD;  she wants to go to [facility] where her husband is but does not want to be the same room with her husband. CSW contacted Revonda Standard, Insurance claims handler of [facility] and referral sent to her. Bed offer given and accepted by patient. Ok for d/c to SNF today per DM.   Assessment/plan status:  No Further Intervention Required Other assessment/ plan:   Information/referral to community resources:   SNF list discussed but she wants to go to [facility] [place]    PATIENT'S/FAMILY'S RESPONSE TO PLAN OF CARE: Patient is alert, oriented and very pleasant. She had hoped to return home but now feels she will benefit from short term SNF. She is pleased that CSW was able to facilitate [name]  d/c to the same facility where her husband is and was appreciative of CSW's efforts. Ok for d/c to SNF today via car. Her son [name] was notified and agreed to d/c plan. He will transport her via car. No further CSW needs identified. CSW signing off.  [name], LCSWA  [phone]

## 2013-02-14 ENCOUNTER — Encounter: Payer: Self-pay | Admitting: Cardiology

## 2013-02-14 ENCOUNTER — Ambulatory Visit: Payer: Self-pay | Admitting: Pharmacist Clinician (PhC)/ Clinical Pharmacy Specialist

## 2013-02-14 ENCOUNTER — Ambulatory Visit (INDEPENDENT_AMBULATORY_CARE_PROVIDER_SITE_OTHER): Payer: Medicare Other | Admitting: Cardiology

## 2013-02-14 VITALS — BP 130/94 | HR 80 | Ht 63.0 in | Wt 144.1 lb

## 2013-02-14 DIAGNOSIS — Z950 Presence of cardiac pacemaker: Secondary | ICD-10-CM

## 2013-02-14 DIAGNOSIS — I480 Paroxysmal atrial fibrillation: Secondary | ICD-10-CM

## 2013-02-14 DIAGNOSIS — Z7901 Long term (current) use of anticoagulants: Secondary | ICD-10-CM

## 2013-02-14 MED ORDER — PRAVASTATIN SODIUM 20 MG PO TABS: 20.0000 mg | ORAL_TABLET | Freq: Every day | ORAL | Status: DC

## 2013-02-14 NOTE — Assessment & Plan Note (Signed)
Placement of PPM, Medtronic for symptomatic bradycardia and hx of PAF with RVR.  Stable, staples removed.

## 2013-02-14 NOTE — Progress Notes (Signed)
02/14/2013   PCP: [name], MD   Chief Complaint  Patient presents with  . Follow-up    post hosp -pacer implant-no swelling at site ,no reddness at site,do not see pravvastatin on med list    Primary Cardiologist: Dr. Royann Shivers  HPI:  Patient is here today for pacer site check.  79 year old white married female with history of sick sinus syndrome with history of paroxysmal A. Fib. Previously followed by Dr. Alanda Amass was admitted to [facility] hospital secondary to confusion and near syncope. She had been on Cipro for UTI that was resolved at admission. Cardiac enzymes were negative, the patient's heart rate was 59 on admission and heart rate would drop as low as in the 20s. Her amiodarone was held, Coumadin was held and patient underwent permanent pacemaker placement without complications.  She is currently residing at [facility]  Nursing home  For rehab, but very much wants to go home. She is ambulating without problems and feels better than she did when she left the hospital.  Denied chest pain or shortness of breath.  Allergies  Allergen Reactions  . Amoxicillin     Can't remember reaction  . Doxycycline     Can't remember reaction  . Penicillins     Can't remember reaction  . Sulfa Antibiotics     Can't remember reaction    Current Outpatient Prescriptions  Medication Sig Dispense Refill  . acetaminophen (TYLENOL) 500 MG tablet Take 500 mg by mouth at bedtime as needed. For pain to help sleep      . amiodarone (PACERONE) 200 MG tablet Take 200 mg by mouth daily.      . calcium-vitamin D (OSCAL WITH D) 500-200 MG-UNIT per tablet Take 1 tablet by mouth.      . docusate sodium 100 MG CAPS Take 100 mg by mouth 2 (two) times daily.  10 capsule  0  . FLUoxetine (PROZAC) 20 MG capsule Take 20 mg by mouth daily.      . furosemide (LASIX) 40 MG tablet Take 40 mg by mouth daily.      Marland Kitchen HYDROcodone-acetaminophen (NORCO/VICODIN) 5-325 MG per tablet Take 1-2 tablets by  mouth every 4 (four) hours as needed for pain (at pacemaker site).  15 tablet  0  . losartan (COZAAR) 50 MG tablet Take 50 mg by mouth daily.      . mirtazapine (REMERON) 15 MG tablet Take 15 mg by mouth at bedtime.      . polyethylene glycol (MIRALAX / GLYCOLAX) packet Take 17 g by mouth daily as needed.    0  . warfarin (COUMADIN) 5 MG tablet Take 2.5 mg by mouth See admin instructions. Take 1/2 tablet for three days then skip a day and repeat until INR is checked again next week.      Marland Kitchen aspirin 81 MG tablet Take 81 mg by mouth daily.      . pravastatin (PRAVACHOL) 20 MG tablet Take 1 tablet (20 mg total) by mouth daily.  30 tablet  6   No current facility-administered medications for this visit.    Past Medical History  Diagnosis Date  . Irregular heart beat   . Hypertension   . Hypercholesteremia   . Depression   . Atrial fibrillation   . Bowel obstruction   . S/P cardiac pacemaker procedure, 02/03/13, Medtronic pacer 02/04/2013    sick sinus syndrome  . CVA (cerebral infarction)   . COPD (chronic obstructive pulmonary disease)   .  Tobacco abuse     quit 2009  . History of echocardiogram 05/21/2012    TEE; EF 55-60%; mild grade 1 atherosclerosis of prox descending aorta & distal aortic arch  . History of nuclear stress test 11/07/2009    dipyridamole; normal pattern of perfusion; negative for ischemia; low risk   . Carotid artery disease     carotid duplex 10/2012 - bilat ICAs normla patency with mod tortuosity of vessel, L vertebral artery demonstrated occlsuve disease    Past Surgical History  Procedure Laterality Date  . Tee without cardioversion  05/21/2012    Procedure: TRANSESOPHAGEAL ECHOCARDIOGRAM (TEE);  Surgeon: Chrystie Nose, MD;  Location: [facility] ENDOSCOPY;  Service: Cardiovascular;  Laterality: N/A;  . Cardioversion  05/21/2012    Procedure: CARDIOVERSION;  Surgeon: Chrystie Nose, MD;  Location: [facility] ENDOSCOPY;  Service: Cardiovascular;  Laterality: N/A;  . Abdominal  surgery    . Tonsillectomy    . Appendectomy    . Abdominal hysterectomy    . Colporrhaphy      posterior  . Pacemaker insertion  07/07/2012    medtronic    WUJ:WJXBJYN:WG colds or fevers, no weight changes CV:see HPI PUL:see HPI Neuro:no syncope, no lightheadedness   PHYSICAL EXAM BP 130/94  Pulse 80  Ht 5\' 3"  (1.6 m)  Wt 144 lb 1.6 oz (65.363 kg)  BMI 25.53 kg/m2 General:Pleasant affect, NAD Skin:Warm and dry, brisk capillary refill HEENT:normocephalic, sclera clear, mucus membranes moist Heart:S1S2 RRR without murmur, gallup, rub or click Lungs:clear without rales, rhonchi, or wheezes Neuro:alert and oriented, MAE, follows commands, + facial symmetry Pacer site well approximated, staples removed and site cleaned with betadine.  steristrips placed more to protect the area.  ASSESSMENT AND PLAN S/P cardiac pacemaker procedure, 02/03/13, Medtronic pacer Placement of PPM, Medtronic for symptomatic bradycardia and hx of PAF with RVR.  Stable, staples removed.   She will follow up with Dr. Eula Fried in 4 weeks. We resumed her pravastatin at a lower dose- 20 mg, Dr. Jeannetta Nap had stopped several months ago.

## 2013-02-14 NOTE — Patient Instructions (Signed)
Follow up with Dr. Judie Petit. Croitoru in 4 weeks.  We restarted pravastatin and 20 mg.   You do not have to wear sling but do not raise left arm above your head

## 2013-02-18 ENCOUNTER — Ambulatory Visit (INDEPENDENT_AMBULATORY_CARE_PROVIDER_SITE_OTHER): Payer: Medicare Other | Admitting: Ophthalmology

## 2013-02-18 DIAGNOSIS — H3530 Unspecified macular degeneration: Secondary | ICD-10-CM

## 2013-02-18 DIAGNOSIS — H3553 Other dystrophies primarily involving the sensory retina: Secondary | ICD-10-CM

## 2013-02-18 DIAGNOSIS — I10 Essential (primary) hypertension: Secondary | ICD-10-CM

## 2013-02-18 DIAGNOSIS — H35039 Hypertensive retinopathy, unspecified eye: Secondary | ICD-10-CM

## 2013-02-18 DIAGNOSIS — H43819 Vitreous degeneration, unspecified eye: Secondary | ICD-10-CM

## 2013-03-14 ENCOUNTER — Ambulatory Visit (INDEPENDENT_AMBULATORY_CARE_PROVIDER_SITE_OTHER): Payer: Medicare Other | Admitting: Cardiovascular Disease

## 2013-03-14 ENCOUNTER — Encounter: Payer: Self-pay | Admitting: Cardiovascular Disease

## 2013-03-14 VITALS — BP 148/90 | HR 80 | Ht 62.5 in | Wt 152.7 lb

## 2013-03-14 DIAGNOSIS — I4891 Unspecified atrial fibrillation: Secondary | ICD-10-CM

## 2013-03-14 DIAGNOSIS — I495 Sick sinus syndrome: Secondary | ICD-10-CM

## 2013-03-14 DIAGNOSIS — Z950 Presence of cardiac pacemaker: Secondary | ICD-10-CM

## 2013-03-14 DIAGNOSIS — R9431 Abnormal electrocardiogram [ECG] [EKG]: Secondary | ICD-10-CM

## 2013-03-14 LAB — PACEMAKER DEVICE OBSERVATION
ATRIAL PACING PM: 98.9
BAMS-0001: 145 {beats}/min
RV LEAD THRESHOLD: 0.75 V
VENTRICULAR PACING PM: 0.3

## 2013-03-14 NOTE — Patient Instructions (Signed)
Remote monitoring is used to monitor your pacemaker from home. This monitoring reduces the number of office visits required to check your device to one time per year. It allows [place] to keep an eye on the functioning of your device to ensure it is working properly. You are scheduled for a device check from home on 06-15-2013. You may send your transmission at any time that day. If you have a wireless device, the transmission will be sent automatically. After your physician reviews your transmission, you will receive a postcard with your next transmission date.  Your physician recommends that you schedule a follow-up appointment in: 1 year

## 2013-03-15 ENCOUNTER — Encounter: Payer: Self-pay | Admitting: Cardiovascular Disease

## 2013-03-16 DIAGNOSIS — R9431 Abnormal electrocardiogram [ECG] [EKG]: Secondary | ICD-10-CM | POA: Insufficient documentation

## 2013-03-16 NOTE — Assessment & Plan Note (Signed)
Full in office pacemaker check shows normal device function. The lead outputs reduced to chronic settings. The patient was educated in remote [facility] followup.

## 2013-03-16 NOTE — Assessment & Plan Note (Signed)
This is almost certainly medication related. She takes both amiodarone and mirtazapine. The dose of amiodarone should be kept at the minimum possible. All other QT prolonging drugs should be carefully avoided.

## 2013-03-16 NOTE — Progress Notes (Signed)
Patient ID: [name], female   DOB: [birth year], 79 y.o.   MRN: [id number]      Reason for office visit Followup one month for pacemaker implantation, history of atrial fibrillation  [name] is now roughly one month following implantation of a dual-chamber permanent pacemaker for symptomatic atrial fibrillation slow ventricular response and sinus node dysfunction. The surgical site has healed well. Interrogation of the device today shows normal lead parameters year there has been no interval atrial fibrillation.  She does have a history of a previous stroke in October of 2013 and has undergone cardioversion at least twice for atrial fibrillation in the past. Her major comorbidity is COPD, possibly pulmonary fibrosis. She has a normal left ventricle systolic function, no history of coronary disease and only a mildly dilated left atrium. This suggests that her atrophic relation is very likely related to her pulmonary problems   Allergies  Allergen Reactions  . Amoxicillin     Can't remember reaction  . Doxycycline     Can't remember reaction  . Penicillins     Can't remember reaction  . Sulfa Antibiotics     Can't remember reaction    Current Outpatient Prescriptions  Medication Sig Dispense Refill  . acetaminophen (TYLENOL) 500 MG tablet Take 500 mg by mouth at bedtime as needed. For pain to help sleep      . amiodarone (PACERONE) 200 MG tablet Take 200 mg by mouth daily.      . calcium-vitamin D (OSCAL WITH D) 500-200 MG-UNIT per tablet Take 1 tablet by mouth.      . docusate sodium 100 MG CAPS Take 100 mg by mouth 2 (two) times daily.  10 capsule  0  . FLUoxetine (PROZAC) 20 MG capsule Take 20 mg by mouth daily.      . furosemide (LASIX) 40 MG tablet Take 40 mg by mouth daily.      Marland Kitchen HYDROcodone-acetaminophen (NORCO/VICODIN) 5-325 MG per tablet Take 1-2 tablets by mouth every 4 (four) hours as needed for pain (at pacemaker site).  15 tablet  0  . losartan (COZAAR) 50 MG  tablet Take 50 mg by mouth daily.      . mirabegron ER (MYRBETRIQ) 50 MG TB24 tablet Take 50 mg by mouth daily.      . mirtazapine (REMERON) 15 MG tablet Take 15 mg by mouth at bedtime.      . polyethylene glycol (MIRALAX / GLYCOLAX) packet Take 17 g by mouth daily as needed.    0  . pravastatin (PRAVACHOL) 20 MG tablet Take 1 tablet (20 mg total) by mouth daily.  30 tablet  6  . warfarin (COUMADIN) 5 MG tablet Take 2.5 mg by mouth See admin instructions. Take 1/2 tablet for three days then skip a day and repeat until INR is checked again next week.       No current facility-administered medications for this visit.    Past Medical History  Diagnosis Date  . Irregular heart beat   . Hypertension   . Hypercholesteremia   . Depression   . Atrial fibrillation   . Bowel obstruction   . S/P cardiac pacemaker procedure, 02/03/13, Medtronic pacer 02/04/2013    sick sinus syndrome  . CVA (cerebral infarction)   . COPD (chronic obstructive pulmonary disease)   . Tobacco abuse     quit 2009  . History of echocardiogram 05/21/2012    TEE; EF 55-60%; mild grade 1 atherosclerosis of prox descending aorta & distal aortic  arch  . History of nuclear stress test 11/07/2009    dipyridamole; normal pattern of perfusion; negative for ischemia; low risk   . Carotid artery disease     carotid duplex 10/2012 - bilat ICAs normla patency with mod tortuosity of vessel, L vertebral artery demonstrated occlsuve disease    Past Surgical History  Procedure Laterality Date  . Tee without cardioversion  05/21/2012    Procedure: TRANSESOPHAGEAL ECHOCARDIOGRAM (TEE);  Surgeon: Chrystie Nose, MD;  Location: [facility] ENDOSCOPY;  Service: Cardiovascular;  Laterality: N/A;  . Cardioversion  05/21/2012    Procedure: CARDIOVERSION;  Surgeon: Chrystie Nose, MD;  Location: [facility] ENDOSCOPY;  Service: Cardiovascular;  Laterality: N/A;  . Abdominal surgery    . Tonsillectomy    . Appendectomy    . Abdominal hysterectomy    .  Colporrhaphy      posterior  . Pacemaker insertion  07/07/2012    medtronic    Family History  Problem Relation Age of Onset  . Coronary artery disease Father     History   Social History  . Marital Status: Married    Spouse Name: N/A    Number of Children: 1  . Years of Education: N/A   Occupational History  . Not on file.   Social History Main Topics  . Smoking status: Former Smoker    Quit date: 06/03/2007  . Smokeless tobacco: Never Used  . Alcohol Use: No  . Drug Use: No  . Sexual Activity: Not on file   Other Topics Concern  . 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  The patient also denies abdominal pain, nausea, vomiting, dysphagia, diarrhea, constipation, polyuria, polydipsia, dysuria, hematuria, frequency, urgency, abnormal bleeding or bruising, fever, chills, unexpected weight changes, mood swings, change in skin or hair texture, change in voice quality, auditory or visual problems, allergic reactions or rashes, new musculoskeletal complaints other than usual "aches and pains".   PHYSICAL EXAM BP 148/90  Pulse 80  Ht 5' 2.5" (1.588 m)  Wt 152 lb 11.2 oz (69.264 kg)  BMI 27.47 kg/m2  General: Alert, oriented x3, no distress Head: no evidence of trauma, PERRL, EOMI, no exophtalmos or lid lag, no myxedema, no xanthelasma; normal ears, nose and oropharynx Neck: normal jugular venous pulsations and no hepatojugular reflux; brisk carotid pulses without delay and no carotid bruits Chest: clear to auscultation, no signs of consolidation by percussion or palpation, normal fremitus,  symmetrical and full respiratory excursions; well-healed left subclavian pacemaker site Cardiovascular: normal position and quality of the apical impulse, regular rhythm, normal first and second heart sounds, no murmurs, rubs or gallops Abdomen: no tenderness or distention, no masses by palpation, no abnormal pulsatility or arterial bruits, normal bowel sounds, no hepatosplenomegaly Extremities: no clubbing, cyanosis or edema; 2+ radial, ulnar and brachial pulses bilaterally; 2+ right femoral, posterior tibial and dorsalis pedis pulses; 2+ left femoral, posterior tibial and dorsalis pedis pulses; no subclavian or femoral bruits Neurological: grossly nonfocal   EKG: Atrial paced, ventricular sensed, left axis deviation and poor R-wave progression consistent with pulmonary disease pattern, prolonged QT interval probably secondary to amiodarone and mirtazapine (QTC is overestimated by the computerized interpretation  but is probably around 480 ms).  BMET    Component Value Date/Time   NA 144 02/03/2013 0450   K 3.9 02/03/2013 0450   CL 109 02/03/2013 0450   CO2 23 02/03/2013 0450   GLUCOSE 104* 02/03/2013 0450   BUN 14 02/03/2013 0450   CREATININE 0.81 02/03/2013 0450   CALCIUM 9.1 02/03/2013 0450   GFRNONAA 67* 02/03/2013 0450   GFRAA 78* 02/03/2013 0450     ASSESSMENT AND PLAN Atrial fibrillation with slow ventricular response Has a previous history of stroke and should remain on lifelong warfarin anticoagulation barring any serious bleeding complications. Required cardioversion in December 2013 . Mildly dilated left atrium, normal left ventricular systolic function, no seizures valvular abnormalities There have been no episodes of atrial fibrillation since pacemaker implantation roughly one month ago.  S/P cardiac pacemaker procedure, 02/03/13, Medtronic pacer Full in office pacemaker check shows normal device function. The lead outputs reduced to chronic settings. The patient was educated in remote [facility]  followup.  Long QT interval This is almost certainly medication related. She takes both amiodarone and mirtazapine. The dose of amiodarone should be kept at the minimum possible. All other QT prolonging drugs should be carefully avoided.   Orders Placed This Encounter  Procedures  . Pacemaker Device Observation  . EKG 12-Lead   Meds ordered this encounter  Medications  . mirabegron ER (MYRBETRIQ) 50 MG TB24 tablet    Sig: Take 50 mg by mouth daily.    Junious Silk, MD, [facility] [phone] office [phone] pager

## 2013-03-16 NOTE — Assessment & Plan Note (Addendum)
Has a previous history of stroke and should remain on lifelong warfarin anticoagulation barring any serious bleeding complications. Required cardioversion in December 2013 . Mildly dilated left atrium, normal left ventricular systolic function, no seizures valvular abnormalities There have been no episodes of atrial fibrillation since pacemaker implantation roughly one month ago.

## 2013-03-17 ENCOUNTER — Encounter: Payer: Self-pay | Admitting: Cardiovascular Disease

## 2013-03-23 ENCOUNTER — Telehealth: Payer: Self-pay | Admitting: Cardiovascular Disease

## 2013-03-23 NOTE — Telephone Encounter (Signed)
Returned call.  Left message to call back tomorrow before 4pm and to leave a message with details r/t medication and questions.

## 2013-03-23 NOTE — Telephone Encounter (Signed)
Please call-question about her medication.

## 2013-03-24 NOTE — Telephone Encounter (Signed)
Returning your call. °

## 2013-03-24 NOTE — Telephone Encounter (Signed)
Returning ambers call regarding mother

## 2013-03-24 NOTE — Telephone Encounter (Signed)
Returned call to [employer], nurse w/ [employer].  Stated she was able to talk to pt's son yesterday evening and he cleared up a lot.  Stated her only question now would be pt's current dose of pravastatin.  Stated pt's son seems to think pt is taking 40 mg daily.  Informed per Rx, pt should be taking 20 mg daily of pravastatin.  Heather verbalized understanding and stated she will f/u with son to discuss meds.  Herbert Seta also asked for a copy of last OV note and informed Medical Records will be notified.    Call to pt and verified x 2.  Pt informed of request from [facility] and that she will need to sign a ROI for records to be release.  Pt also informed she has a right to decline the release of her records.  Pt verbalized understanding and stated her son usually handles things like this.  RN asked pt if she wanted RN to talk w/ son.  Pt stated she did, but son will not be home until about 4pm.  Pt left message for son to call back.

## 2013-03-25 NOTE — Telephone Encounter (Signed)
Returned call to [employer], pt's son, and informed of request for records as stated below.  Verbalized understanding.

## 2013-06-15 ENCOUNTER — Ambulatory Visit (INDEPENDENT_AMBULATORY_CARE_PROVIDER_SITE_OTHER): Payer: Medicare Other

## 2013-06-15 DIAGNOSIS — I498 Other specified cardiac arrhythmias: Secondary | ICD-10-CM

## 2013-06-15 DIAGNOSIS — I4891 Unspecified atrial fibrillation: Secondary | ICD-10-CM

## 2013-06-15 DIAGNOSIS — I495 Sick sinus syndrome: Secondary | ICD-10-CM

## 2013-06-15 DIAGNOSIS — R001 Bradycardia, unspecified: Secondary | ICD-10-CM

## 2013-06-15 LAB — PACEMAKER DEVICE OBSERVATION

## 2013-06-16 LAB — MDC_IDC_ENUM_SESS_TYPE_REMOTE
Battery Remaining Longevity: 163 mo
Brady Statistic AP VS Percent: 99 %
Brady Statistic AS VP Percent: 0 %
Brady Statistic AS VS Percent: 1 %
Date Time Interrogation Session: 20150116023237
Lead Channel Impedance Value: 537 Ohm
Lead Channel Pacing Threshold Amplitude: 0.5 V
Lead Channel Pacing Threshold Amplitude: 1 V
Lead Channel Pacing Threshold Pulse Width: 0.4 ms
Lead Channel Sensing Intrinsic Amplitude: 5.6 mV
Lead Channel Setting Pacing Amplitude: 1.5 V
MDC IDC MSMT BATTERY IMPEDANCE: 100 Ohm
MDC IDC MSMT BATTERY VOLTAGE: 2.8 V
MDC IDC MSMT LEADCHNL RA IMPEDANCE VALUE: 580 Ohm
MDC IDC MSMT LEADCHNL RV PACING THRESHOLD PULSEWIDTH: 0.4 ms
MDC IDC SET LEADCHNL RV PACING AMPLITUDE: 2 V
MDC IDC SET LEADCHNL RV PACING PULSEWIDTH: 0.4 ms
MDC IDC SET LEADCHNL RV SENSING SENSITIVITY: 2.8 mV
MDC IDC STAT BRADY AP VP PERCENT: 0 %

## 2013-06-20 ENCOUNTER — Other Ambulatory Visit (HOSPITAL_COMMUNITY): Payer: Self-pay | Admitting: Family Medicine

## 2013-06-20 DIAGNOSIS — R0609 Other forms of dyspnea: Principal | ICD-10-CM

## 2013-06-20 LAB — PULMONARY FUNCTION TEST
DL/VA % pred: 65 %
DL/VA: 2.97 ml/min/mmHg/L
DLCO UNC % PRED: 41 %
DLCO UNC: 8.96 ml/min/mmHg
FEF 25-75 POST: 0.68 L/s
FEF 25-75 PRE: 0.42 L/s
FEF2575-%Change-Post: 62 %
FEF2575-%PRED-POST: 52 %
FEF2575-%Pred-Pre: 32 %
FEV1-%Change-Post: 13 %
FEV1-%PRED-POST: 66 %
FEV1-%Pred-Pre: 58 %
FEV1-PRE: 1.03 L
FEV1-Post: 1.17 L
FEV1FVC-%Change-Post: -1 %
FEV1FVC-%PRED-PRE: 76 %
FEV6-%Change-Post: 14 %
FEV6-%PRED-POST: 90 %
FEV6-%PRED-PRE: 78 %
FEV6-POST: 2.03 L
FEV6-Pre: 1.77 L
FEV6FVC-%CHANGE-POST: 0 %
FEV6FVC-%PRED-POST: 102 %
FEV6FVC-%Pred-Pre: 102 %
FVC-%CHANGE-POST: 15 %
FVC-%PRED-POST: 87 %
FVC-%PRED-PRE: 76 %
FVC-POST: 2.08 L
FVC-PRE: 1.81 L
PRE FEV1/FVC RATIO: 57 %
PRE FEV6/FVC RATIO: 98 %
Post FEV1/FVC ratio: 56 %
Post FEV6/FVC ratio: 97 %
RV % PRED: 91 %
RV: 2.09 L
TLC % pred: 87 %
TLC: 4.16 L

## 2013-06-22 ENCOUNTER — Ambulatory Visit (HOSPITAL_COMMUNITY)
Admission: RE | Admit: 2013-06-22 | Discharge: 2013-06-22 | Disposition: A | Discharge status: Home / Self Care | Payer: Medicare Other | Source: Ambulatory Visit | Attending: Family Medicine | Admitting: Family Medicine

## 2013-06-22 DIAGNOSIS — J988 Other specified respiratory disorders: Secondary | ICD-10-CM | POA: Insufficient documentation

## 2013-06-22 DIAGNOSIS — Z87891 Personal history of nicotine dependence: Secondary | ICD-10-CM | POA: Insufficient documentation

## 2013-06-22 DIAGNOSIS — R062 Wheezing: Secondary | ICD-10-CM | POA: Insufficient documentation

## 2013-06-22 DIAGNOSIS — R0609 Other forms of dyspnea: Secondary | ICD-10-CM | POA: Insufficient documentation

## 2013-06-22 DIAGNOSIS — R0989 Other specified symptoms and signs involving the circulatory and respiratory systems: Principal | ICD-10-CM | POA: Insufficient documentation

## 2013-06-22 MED ORDER — ALBUTEROL SULFATE (2.5 MG/3ML) 0.083% IN NEBU
2.5000 mg | INHALATION_SOLUTION | Freq: Once | RESPIRATORY_TRACT | Status: AC
  Administered 2013-06-22: 2.5 mg via RESPIRATORY_TRACT

## 2013-07-07 ENCOUNTER — Encounter: Payer: Self-pay | Admitting: *Deleted

## 2013-08-11 ENCOUNTER — Ambulatory Visit (INDEPENDENT_AMBULATORY_CARE_PROVIDER_SITE_OTHER): Payer: Medicare Other | Admitting: Internal Medicine

## 2013-08-11 ENCOUNTER — Encounter: Payer: Self-pay | Admitting: Internal Medicine

## 2013-08-11 VITALS — BP 160/92 | HR 72 | Ht 62.5 in | Wt 167.0 lb

## 2013-08-11 DIAGNOSIS — J439 Emphysema, unspecified: Secondary | ICD-10-CM

## 2013-08-11 DIAGNOSIS — J438 Other emphysema: Secondary | ICD-10-CM

## 2013-08-11 MED ORDER — UMECLIDINIUM-VILANTEROL 62.5-25 MCG/INH IN AEPB: INHALATION_SPRAY | RESPIRATORY_TRACT | Status: DC

## 2013-08-11 NOTE — Progress Notes (Signed)
08/11/13- 45 yoF former smoker referred courtesy of Dr Dorann Lodge PFT Son "[name]" is here. She had been smoking less than one pack per day when she quit in 2009. She has been noticing shortness of breath with light to moderate activity, relieved by rest. This seems to have been going on for months up to a year or more. Minimal wheeze and little change from one day to the next. Little cough, no phlegm. Her ankles may swell occasionally. History of pneumonia one year ago and had flu in October 2014. Pacemaker for atrial fibrillation with slow VR. [facility] PFT: 06/22/2013-moderate obstructive airways disease with diffusion severely reduced.  FVC 2.08/87%, FEV1 1.17/66%, FEV1/FVC 0.56, FEF 25-75% 0.58/74%. Slight response to bronchodilator. Diffusion 41% of predicted. CXR 01/31/13 Findings: A left dual lead cardiac pacemaker has been placed. The  leads are in the region of the right ventricle and right atrium.  Negative for a pneumothorax. Lungs are clear without airspace  disease or pulmonary edema. Retrocardiac density is suggestive for  a small hiatal hernia. There are atherosclerotic calcifications at  the aortic arch. Heart size is normal.  IMPRESSION:  Placement of left chest cardiac pacemaker. Negative for  pneumothorax.  No acute chest findings.  Original Report Authenticated By: Richarda Overlie, M.D.  Prior to Admission medications   Medication Sig Start Date End Date Taking? Authorizing Provider  acetaminophen (TYLENOL) 500 MG tablet Take 500 mg by mouth at bedtime as needed. For pain to help sleep   Yes Historical Provider, MD  amiodarone (PACERONE) 200 MG tablet Take 200 mg by mouth daily.   Yes Historical Provider, MD  beta carotene w/minerals (OCUVITE) tablet Take 1 tablet by mouth daily.   Yes Historical Provider, MD  calcium-vitamin D (OSCAL WITH D) 500-200 MG-UNIT per tablet Take 1 tablet by mouth.   Yes Historical Provider, MD  FLUoxetine (PROZAC) 20  MG capsule Take 20 mg by mouth daily.   Yes Historical Provider, MD  furosemide (LASIX) 40 MG tablet Take 40 mg by mouth daily.   Yes Historical Provider, MD  losartan (COZAAR) 50 MG tablet Take 50 mg by mouth daily.   Yes Historical Provider, MD  mirabegron ER (MYRBETRIQ) 50 MG TB24 tablet Take 50 mg by mouth daily.   Yes Historical Provider, MD  mirtazapine (REMERON) 15 MG tablet Take 15 mg by mouth at bedtime.   Yes Historical Provider, MD  pravastatin (PRAVACHOL) 40 MG tablet Take 40 mg by mouth daily.   Yes Historical Provider, MD  warfarin (COUMADIN) 5 MG tablet Take 2.5 mg by mouth See admin instructions. Take 1/2 tablet for three days then skip a day and repeat until INR is checked again next week.   Yes Historical Provider, MD  Umeclidinium-Vilanterol Ailene Ards ELLIPTA) 62.5-25 MCG/INH AEPB Inhale 1 puff, once daily 08/11/13   Waymon Budge, MD   Past Medical History  Diagnosis Date  . Irregular heart beat   . Hypertension   . Hypercholesteremia   . Depression   . Atrial fibrillation   . Bowel obstruction   . S/P cardiac pacemaker procedure, 02/03/13, Medtronic pacer 02/04/2013    sick sinus syndrome  . CVA (cerebral infarction)   . COPD (chronic obstructive pulmonary disease)   . Tobacco abuse     quit 2009  . History of echocardiogram 05/21/2012    TEE; EF 55-60%; mild grade 1 atherosclerosis of prox descending aorta & distal aortic arch  . History of nuclear stress test 11/07/2009  dipyridamole; normal pattern of perfusion; negative for ischemia; low risk   . Carotid artery disease     carotid duplex 10/2012 - bilat ICAs normla patency with mod tortuosity of vessel, L vertebral artery demonstrated occlsuve disease   Past Surgical History  Procedure Laterality Date  . Tee without cardioversion  05/21/2012    Procedure: TRANSESOPHAGEAL ECHOCARDIOGRAM (TEE);  Surgeon: Chrystie Nose, MD;  Location: [facility] ENDOSCOPY;  Service: Cardiovascular;  Laterality: N/A;  . Cardioversion   05/21/2012    Procedure: CARDIOVERSION;  Surgeon: Chrystie Nose, MD;  Location: [facility] ENDOSCOPY;  Service: Cardiovascular;  Laterality: N/A;  . Abdominal surgery    . Tonsillectomy    . Appendectomy    . Abdominal hysterectomy    . Colporrhaphy      posterior  . Pacemaker insertion  07/07/2012    medtronic   Family History  Problem Relation Age of Onset  . Coronary artery disease Father   . Heart attack Brother   . Stomach cancer Paternal Grandmother    History   Social History  . Marital Status: Married    Spouse Name: N/A    Number of Children: 1  . Years of Education: N/A   Occupational History  . retired    Social History Main Topics  . Smoking status: Former Smoker -- 1.00 packs/day for 38 years    Types: Cigarettes    Start date: 06/02/1969    Quit date: 06/03/2007  . Smokeless tobacco: Never Used  . Alcohol Use: No  . Drug Use: No  . Sexual Activity: Not on file   Other Topics Concern  . 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 HEENT:   No-  headaches, difficulty swallowing, tooth/dental problems, sore throat,       No-  sneezing, itching, ear ache, nasal congestion, post nasal drip,  CV:  No-   chest pain, orthopnea, PND, swelling in lower extremities, anasarca,                                  dizziness, palpitations Resp: + shortness of breath with exertion or at rest.              No-   productive cough,  No non-productive cough,  No- coughing up of blood.              No-   change in color of mucus.  No- wheezing.   Skin: No-   rash or lesions. GI:  No-   heartburn, indigestion, abdominal pain, nausea, vomiting, diarrhea,                 change in bowel habits, loss of appetite GU: No-   dysuria, change in color of urine, no urgency or frequency.  No- flank pain. MS:  No-   joint pain or swelling.   No- decreased range of motion.  No- back pain. Neuro-     nothing unusual Psych:  No- change in mood or affect. No depression or anxiety.  No memory loss.  OBJ- Physical Exam General- Alert, Oriented, Affect-appropriate, Distress- none acute, pale complexion Skin- rash-none, lesions- none, excoriation- none Lymphadenopathy- none Head- atraumatic            Eyes- Gross vision  intact, PERRLA, conjunctivae and secretions clear            Ears- Hearing, canals-normal            Nose- Clear, no-Septal dev, mucus, polyps, erosion, perforation             Throat- Mallampati II , mucosa clear , drainage- none, tonsils- atrophic Neck- flexible , trachea midline, no stridor , thyroid nl, carotid no bruit Chest - symmetrical excursion , unlabored           Heart/CV- RRR , no murmur , no gallop  , no rub, nl s1 s2                           - JVD- none , edema- none, stasis changes- none, varices- none           Lung- clear to P&A, wheeze- none, cough- none , dullness-none, rub- none           Chest wall-  Abd- tender-no, distended-no, bowel sounds-present, HSM- no Br/ Gen/ Rectal- Not done, not indicated Extrem- cyanosis- none, clubbing, none, atrophy- none, strength- nl Neuro- grossly intact to observation

## 2013-08-11 NOTE — Patient Instructions (Signed)
You have moderate COPD, meaning airflow through your lungs is a little slow because of your old smoking history  Sample and script for Anoro maitenance inhaler    1 puff once every day

## 2013-08-12 DIAGNOSIS — J439 Emphysema, unspecified: Secondary | ICD-10-CM | POA: Insufficient documentation

## 2013-08-12 NOTE — Assessment & Plan Note (Signed)
Moderate COPD consistent with her previous smoking history. She was not anemic last time check so her pale color is probably her normal. She has a pacemaker so we watch for cardiac component of dyspnea. Plan-sample and prescription for Anoro inhaler with education. Continue exercise.

## 2013-08-18 ENCOUNTER — Ambulatory Visit (INDEPENDENT_AMBULATORY_CARE_PROVIDER_SITE_OTHER): Payer: Medicare Other | Admitting: Ophthalmology

## 2013-08-18 DIAGNOSIS — H35039 Hypertensive retinopathy, unspecified eye: Secondary | ICD-10-CM

## 2013-08-18 DIAGNOSIS — H3530 Unspecified macular degeneration: Secondary | ICD-10-CM

## 2013-08-18 DIAGNOSIS — H35059 Retinal neovascularization, unspecified, unspecified eye: Secondary | ICD-10-CM

## 2013-08-18 DIAGNOSIS — I10 Essential (primary) hypertension: Secondary | ICD-10-CM

## 2013-08-18 DIAGNOSIS — H43819 Vitreous degeneration, unspecified eye: Secondary | ICD-10-CM

## 2013-08-25 ENCOUNTER — Encounter (INDEPENDENT_AMBULATORY_CARE_PROVIDER_SITE_OTHER): Payer: Medicare Other | Admitting: Ophthalmology

## 2013-09-15 ENCOUNTER — Encounter (INDEPENDENT_AMBULATORY_CARE_PROVIDER_SITE_OTHER): Payer: Medicare Other | Admitting: Ophthalmology

## 2013-09-15 ENCOUNTER — Ambulatory Visit (INDEPENDENT_AMBULATORY_CARE_PROVIDER_SITE_OTHER): Payer: Medicare Other | Admitting: *Deleted

## 2013-09-15 DIAGNOSIS — I4891 Unspecified atrial fibrillation: Secondary | ICD-10-CM

## 2013-09-15 DIAGNOSIS — H35059 Retinal neovascularization, unspecified, unspecified eye: Secondary | ICD-10-CM

## 2013-09-19 ENCOUNTER — Telehealth: Payer: Self-pay | Admitting: Internal Medicine

## 2013-09-19 NOTE — Telephone Encounter (Signed)
Samples are up front for pick up. lmtcb x1

## 2013-09-19 NOTE — Telephone Encounter (Signed)
Pt's son returned call. °

## 2013-09-19 NOTE — Telephone Encounter (Signed)
Pt's son is aware that samples are ready to be picked up. Nothing further was needed.

## 2013-09-20 ENCOUNTER — Telehealth: Payer: Self-pay | Admitting: Cardiovascular Disease

## 2013-09-20 LAB — MDC_IDC_ENUM_SESS_TYPE_REMOTE
Battery Impedance: 100 Ohm
Battery Voltage: 2.81 V
Brady Statistic AS VS Percent: 1 %
Lead Channel Pacing Threshold Amplitude: 0.75 V
Lead Channel Pacing Threshold Pulse Width: 0.4 ms
Lead Channel Setting Pacing Amplitude: 2 V
Lead Channel Setting Pacing Pulse Width: 0.4 ms
Lead Channel Setting Sensing Sensitivity: 2.8 mV
MDC IDC MSMT BATTERY REMAINING LONGEVITY: 162 mo
MDC IDC MSMT LEADCHNL RA IMPEDANCE VALUE: 552 Ohm
MDC IDC MSMT LEADCHNL RA PACING THRESHOLD AMPLITUDE: 0.625 V
MDC IDC MSMT LEADCHNL RA PACING THRESHOLD PULSEWIDTH: 0.4 ms
MDC IDC MSMT LEADCHNL RV IMPEDANCE VALUE: 556 Ohm
MDC IDC MSMT LEADCHNL RV SENSING INTR AMPL: 5.6 mV
MDC IDC SESS DTM: 20150416192517
MDC IDC SET LEADCHNL RA PACING AMPLITUDE: 1.5 V
MDC IDC STAT BRADY AP VP PERCENT: 0 %
MDC IDC STAT BRADY AP VS PERCENT: 99 %
MDC IDC STAT BRADY AS VP PERCENT: 0 %

## 2013-09-20 NOTE — Telephone Encounter (Signed)
Would like to check on orders for her amlodipine and  her losartan.. According what she is getting refilled it does not match up with what she is taking .Marland Kitchen. Please Call  Thanks

## 2013-09-20 NOTE — Telephone Encounter (Signed)
Returned call to Avery DennisonHeather with [employer] to clarify amiodarone and losartan dose and freq.

## 2013-09-22 ENCOUNTER — Encounter: Payer: Self-pay | Admitting: Internal Medicine

## 2013-09-22 ENCOUNTER — Ambulatory Visit (INDEPENDENT_AMBULATORY_CARE_PROVIDER_SITE_OTHER): Payer: Medicare Other | Admitting: Internal Medicine

## 2013-09-22 VITALS — BP 124/76 | HR 79 | Ht 62.5 in | Wt 169.4 lb

## 2013-09-22 DIAGNOSIS — J439 Emphysema, unspecified: Secondary | ICD-10-CM

## 2013-09-22 DIAGNOSIS — J9600 Acute respiratory failure, unspecified whether with hypoxia or hypercapnia: Secondary | ICD-10-CM

## 2013-09-22 DIAGNOSIS — J9691 Respiratory failure, unspecified with hypoxia: Secondary | ICD-10-CM

## 2013-09-22 DIAGNOSIS — J438 Other emphysema: Secondary | ICD-10-CM

## 2013-09-22 NOTE — Progress Notes (Signed)
08/11/13- [age] yoF former smoker referred courtesy of Dr Dorann LodgeElkins-abnormal PFT Son "[name]" is here. She had been smoking less than one pack per day when she quit in 2009. She has been noticing shortness of breath with light to moderate activity, relieved by rest. This seems to have been going on for months up to a year or more. Minimal wheeze and little change from one day to the next. Little cough, no phlegm. Her ankles may swell occasionally. History of pneumonia one year ago and had flu in October 2014. Pacemaker for atrial fibrillation with slow VR. [facility] at [facility] center PFT: 06/22/2013-moderate obstructive airways disease with diffusion severely reduced.  FVC 2.08/87%, FEV1 1.17/66%, FEV1/FVC 0.56, FEF 25-75% 0.58/74%. Slight response to bronchodilator. Diffusion 41% of predicted. CXR 01/31/13 Findings: A left dual lead cardiac pacemaker has been placed. The  leads are in the region of the right ventricle and right atrium.  Negative for a pneumothorax. Lungs are clear without airspace  disease or pulmonary edema. Retrocardiac density is suggestive for  a small hiatal hernia. There are atherosclerotic calcifications at  the aortic arch. Heart size is normal.  IMPRESSION:  Placement of left chest cardiac pacemaker. Negative for  pneumothorax.  No acute chest findings.  Original Report Authenticated By: Richarda OverlieAdam Henn, M.D.  09/22/13- [age] yoF former smoker followed for dyspnea, COPD, dCHF, AFib/ pacemaker FOLLOWS FOR: Continues to use Anoro daily and can tell a difference. Son states patient seems to stay SOB alot. On direct questioning, it is not clear they see any benefit from Anoro. No acute issues. Breathing comfortably at night. Stable dyspnea on exertion with little cough or wheeze.  ROS-see HPI Constitutional:   No-   weight loss, night sweats, fevers, chills, fatigue, lassitude. HEENT:   No-  headaches, difficulty swallowing, tooth/dental problems, sore throat,       No-   sneezing, itching, ear ache, nasal congestion, post nasal drip,  CV:  No-   chest pain, orthopnea, PND, swelling in lower extremities, anasarca,                                  dizziness, palpitations Resp: + shortness of breath with exertion or at rest.              No-   productive cough,  No non-productive cough,  No- coughing up of blood.              No-   change in color of mucus.  No- wheezing.   Skin: No-   rash or lesions. GI:  No-   heartburn, indigestion, abdominal pain, nausea, vomiting,  GU:  MS:  No-   joint pain or swelling.  . Neuro-     nothing unusual Psych:  No- change in mood or affect. No depression or anxiety.  No memory loss.  OBJ- Physical Exam General- +Seems vague- maybe just refecting her age, Oriented, Affect-appropriate, Distress-                    none acute,  Skin- rash-none, lesions- none, excoriation- none Lymphadenopathy- none Head- atraumatic            Eyes- Gross vision intact, PERRLA, conjunctivae and secretions clear            Ears- Hearing, canals-normal            Nose- Clear, no-Septal dev, mucus, polyps, erosion, perforation  Throat- Mallampati II , mucosa clear , drainage- none, tonsils- atrophic Neck- flexible , trachea midline, no stridor , thyroid nl, carotid no bruit Chest - symmetrical excursion , unlabored           Heart/CV- RRR , no murmur , no gallop  , no rub, nl s1 s2                           - JVD- none , edema- none, stasis changes- none, varices- none           Lung- clear to P&A/ few crackles, wheeze- none, cough- none , dullness-none, rub- none           Chest wall- L pacemaker Abd-  Br/ Gen/ Rectal- Not done, not indicated Extrem- cyanosis- none, clubbing, none, atrophy- none, strength- nl Neuro- grossly intact to observation

## 2013-09-22 NOTE — Assessment & Plan Note (Signed)
95% saturation on room air at this visit

## 2013-09-22 NOTE — Assessment & Plan Note (Signed)
Not clear that Anoro is cost effective Plan-try off and on Anoro- if no useful effect, don't buy it.

## 2013-09-22 NOTE — Patient Instructions (Signed)
Try skipping Anoro inhaler for 4 or 5 days, then start it again. See if it makes any real difference in your shortness of breath. If it doesn't help, there is no point in taking it.

## 2013-09-23 ENCOUNTER — Encounter: Payer: Self-pay | Admitting: *Deleted

## 2013-09-28 ENCOUNTER — Encounter: Payer: Self-pay | Admitting: Cardiovascular Disease

## 2013-09-30 ENCOUNTER — Telehealth: Payer: Self-pay | Admitting: Internal Medicine

## 2013-09-30 NOTE — Telephone Encounter (Signed)
Called and spoke with pts husband and he is aware that Florentina Addisonkatie is not in the office today.  [facility] to follow up with pt on Monday.

## 2013-10-03 NOTE — Telephone Encounter (Signed)
LMTCB-speak with Florentina AddisonKatie only!. Thanks.

## 2013-10-03 NOTE — Telephone Encounter (Signed)
Spoke with Fraser DinPreston- he is aware that the Drug Rep for Anoro went to pharmacy and got the situation worked out as far as being charged 2 copays for 1 month supply of med. Fraser Dinreston will go by pharmacy to work out how to take are of the additional cost and possible refund. Nothing more needed at this time.

## 2013-10-03 NOTE — Telephone Encounter (Signed)
[name] returned [name]'s call. Per Katie's note he needs to speak to her only.

## 2013-10-13 ENCOUNTER — Encounter (INDEPENDENT_AMBULATORY_CARE_PROVIDER_SITE_OTHER): Payer: Medicare Other | Admitting: Ophthalmology

## 2013-10-13 DIAGNOSIS — H35059 Retinal neovascularization, unspecified, unspecified eye: Secondary | ICD-10-CM

## 2013-11-03 ENCOUNTER — Other Ambulatory Visit: Payer: Self-pay | Admitting: Cardiology

## 2013-11-03 NOTE — Telephone Encounter (Signed)
Rx was sent to pharmacy electronically. 

## 2013-12-09 ENCOUNTER — Telehealth: Payer: Self-pay | Admitting: Cardiovascular Disease

## 2013-12-09 NOTE — Telephone Encounter (Signed)
Attempted to call patient's son back. He was unavailable. Spoke with patient.   She states she is doing "pretty good" but get short of breath off and on. She said the shortness of breath has gotten a little worse but is not new. She reports she has a pacemaker (her next remote check is on 7/20). She denies edema, chest pain, lightheadedness/dizziness. She reports gradual weight gain.   Asked patient if she would like to be seen in office prior to when OV with Dr. Salena Saner is due in October and she stated she will see how she feels and then make an appointment if she thinks she needs one.   Will defer to Dr. Salena Saner as Lorain ChildesFYI.

## 2013-12-09 NOTE — Telephone Encounter (Signed)
Please ask her to do her download early - next week

## 2013-12-09 NOTE — Telephone Encounter (Signed)
Pt is extremely short of breath, and very fatigued. Pt needs to be seen. Please call after 4 today so ypu can talk to VermillionPreston.

## 2013-12-12 ENCOUNTER — Telehealth: Payer: Self-pay | Admitting: Cardiovascular Disease

## 2013-12-12 NOTE — Telephone Encounter (Signed)
Of noted, [facility] and [facility].   Son will call back with questions, if any.

## 2013-12-12 NOTE — Telephone Encounter (Signed)
New Prob    [facility] nurse is requesting for someone from our office give pt a call regarding new SOB and new fatigue onset 1 week after 4 PM. Please call.

## 2013-12-12 NOTE — Telephone Encounter (Signed)
Called patient and advised that she do a download of her pacemaker today or tomorrow. She wrote down this information for her son so he can do when he gets by to see her.   Patient c/o fatigue and she states her "breathing ain't as good". She reports she gets SOB with normal day to day activities. RN noted audible wheezing over phone and asked about inhaler, which patient states she takes one every night.

## 2013-12-19 ENCOUNTER — Encounter: Payer: Self-pay | Admitting: Cardiovascular Disease

## 2013-12-19 ENCOUNTER — Ambulatory Visit (INDEPENDENT_AMBULATORY_CARE_PROVIDER_SITE_OTHER): Payer: Medicare Other | Admitting: *Deleted

## 2013-12-19 ENCOUNTER — Telehealth: Payer: Self-pay | Admitting: Cardiology

## 2013-12-19 DIAGNOSIS — I4891 Unspecified atrial fibrillation: Secondary | ICD-10-CM

## 2013-12-19 DIAGNOSIS — I480 Paroxysmal atrial fibrillation: Secondary | ICD-10-CM

## 2013-12-19 DIAGNOSIS — I495 Sick sinus syndrome: Secondary | ICD-10-CM

## 2013-12-19 LAB — MDC_IDC_ENUM_SESS_TYPE_REMOTE
Battery Impedance: 100 Ohm
Brady Statistic AP VP Percent: 0 %
Brady Statistic AS VS Percent: 3 %
Date Time Interrogation Session: 20150720194402
Lead Channel Impedance Value: 495 Ohm
Lead Channel Pacing Threshold Amplitude: 0.875 V
Lead Channel Pacing Threshold Pulse Width: 0.4 ms
Lead Channel Pacing Threshold Pulse Width: 0.4 ms
Lead Channel Setting Pacing Amplitude: 1.5 V
Lead Channel Setting Pacing Amplitude: 2 V
Lead Channel Setting Sensing Sensitivity: 2 mV
MDC IDC MSMT BATTERY REMAINING LONGEVITY: 162 mo
MDC IDC MSMT BATTERY VOLTAGE: 2.79 V
MDC IDC MSMT LEADCHNL RA IMPEDANCE VALUE: 527 Ohm
MDC IDC MSMT LEADCHNL RA PACING THRESHOLD AMPLITUDE: 0.75 V
MDC IDC MSMT LEADCHNL RV SENSING INTR AMPL: 11.2 mV
MDC IDC SET LEADCHNL RV PACING PULSEWIDTH: 0.4 ms
MDC IDC STAT BRADY AP VS PERCENT: 97 %
MDC IDC STAT BRADY AS VP PERCENT: 0 %

## 2013-12-19 NOTE — Telephone Encounter (Signed)
Spoke with pt and reminded pt of remote transmission that is due today. Pt verbalized understanding.   

## 2013-12-20 NOTE — Progress Notes (Signed)
Remote pacemaker transmission.   

## 2013-12-28 ENCOUNTER — Encounter: Payer: Self-pay | Admitting: Cardiology

## 2013-12-28 ENCOUNTER — Ambulatory Visit (INDEPENDENT_AMBULATORY_CARE_PROVIDER_SITE_OTHER): Payer: Medicare Other | Admitting: Cardiology

## 2013-12-28 VITALS — BP 110/68 | HR 77 | Ht 63.0 in | Wt 170.0 lb

## 2013-12-28 DIAGNOSIS — R0600 Dyspnea, unspecified: Secondary | ICD-10-CM

## 2013-12-28 DIAGNOSIS — R0989 Other specified symptoms and signs involving the circulatory and respiratory systems: Secondary | ICD-10-CM

## 2013-12-28 DIAGNOSIS — R5383 Other fatigue: Secondary | ICD-10-CM

## 2013-12-28 DIAGNOSIS — R0609 Other forms of dyspnea: Secondary | ICD-10-CM

## 2013-12-28 DIAGNOSIS — R5381 Other malaise: Secondary | ICD-10-CM

## 2013-12-28 NOTE — Progress Notes (Signed)
Patient ID: Regina OverlieShirley A Bosch, female   DOB: 1931/08/24, 80 y.o.   MRN: [id number]    12/28/2013 Regina OverlieShirley A Shatzer   1931/08/24  [id number]  Primary Physicia Kaleen MaskELKINS,WILSON OLIVER, MD Primary Cardiologist: Dr Royann Shiversroitoru  HPI:  The patient is a 80 year old female, followed by Dr. Royann Shiversroitoru. She has a history of atrial fibrillation as well as previous stroke in October of 2013. She is on chronic oral anticoagulation with warfarin for stroke prophylaxis. She has undergone cardioversion at least twice for her atrial fibrillation in the past. In 2014, she required implantation of a Medtronic permanent pacemaker, in the setting of atrial fibrillation with a slow ventricular response and sinus node dysfunction. Her major comorbidity is COPD, possibly pulmonary fibrosis. Her last 2D echo was in 2013, demonstrated normal systolic function with an EF of 65-70%. She has no history of coronary disease and only a mild dilated left atrium.   She presents to clinic today with complaints of increased fatigue and mild dyspnea. Her symptoms first started about 4 weeks ago and have progressively worsened. She notes feeling tired all the time with decreased exercise tolerance. She often has to stop and rest while doing house hold chores. She denies chest pain, pressure and tightness. No palpitations, dizziness, syncope/near syncope. She has moderate COPD and has dyspnea at baseline, but has noticed mild increase dyspnea. Worse with exertion. She denies LEE, orthopnea, PND and LEE. No cough.    Current Outpatient Prescriptions  Medication Sig Dispense Refill  . acetaminophen (TYLENOL) 500 MG tablet Take 500 mg by mouth at bedtime as needed. For pain to help sleep      . amiodarone (PACERONE) 200 MG tablet Take 200 mg by mouth daily.      . beta carotene w/minerals (OCUVITE) tablet Take 1 tablet by mouth daily.      . calcium-vitamin D (OSCAL WITH D) 500-200 MG-UNIT per tablet Take 1 tablet by mouth.      Marland Kitchen. FLUoxetine (PROZAC)  20 MG capsule Take 20 mg by mouth daily.      . furosemide (LASIX) 40 MG tablet Take 40 mg by mouth daily.      Marland Kitchen. losartan (COZAAR) 50 MG tablet Take 50 mg by mouth daily.      . mirabegron ER (MYRBETRIQ) 50 MG TB24 tablet Take 50 mg by mouth daily.      . mirtazapine (REMERON) 15 MG tablet Take 15 mg by mouth at bedtime.      . pravastatin (PRAVACHOL) 40 MG tablet Take 40 mg by mouth daily.      Marland Kitchen. Umeclidinium-Vilanterol (ANORO ELLIPTA) 62.5-25 MCG/INH AEPB Inhale 1 puff, once daily  1 each  prn  . warfarin (COUMADIN) 5 MG tablet Take 2.5 mg by mouth See admin instructions. Take 1/2 tablet for three days then skip a day and repeat until INR is checked again next week.       No current facility-administered medications for this visit.    Allergies  Allergen Reactions  . Amoxicillin     Can't remember reaction  . Doxycycline     Can't remember reaction  . Penicillins     Can't remember reaction  . Sulfa Antibiotics     Can't remember reaction    History   Social History  . Marital Status: Married    Spouse Name: N/A    Number of Children: 1  . Years of Education: N/A   Occupational History  . retired    Social History Main Topics  .  Smoking status: Former Smoker -- 1.00 packs/day for 38 years    Types: Cigarettes    Start date: 06/02/1969    Quit date: 06/03/2007  . Smokeless tobacco: Never Used  . Alcohol Use: No  . Drug Use: No  . Sexual Activity: Not on file   Other Topics Concern  . 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  Cardiovascular: negative for chest pain, dyspnea on exertion, edema, orthopnea, palpitations, paroxysmal nocturnal dyspnea or shortness of breath Dermatological: negative for rash Respiratory: negative for cough or wheezing Urologic: negative for hematuria Abdominal: negative for nausea,  vomiting, diarrhea, bright red blood per rectum, melena, or hematemesis Neurologic: negative for visual changes, syncope, or dizziness All other systems reviewed and are otherwise negative except as noted above.    Blood pressure 110/68, pulse 77, height 5\' 3"  (1.6 m), weight 170 lb (77.111 kg).  General appearance: alert, cooperative and no distress Neck: no carotid bruit and no JVD Lungs: clear to auscultation bilaterally Heart: regular rate and rhythm, S1, S2 normal, no murmur, click, rub or gallop Extremities: no LEE Pulses: 2+ and symmetric Skin: Skin color, texture, turgor normal. No rashes or lesions Neurologic: Grossly normal  EKG normal rhythm. 77 beats per minute  ASSESSMENT AND PLAN:   1. Fatigue: Will check a BMP, CBC, TSH, BMET and 2D echo.   2. SOB: Will check a 2D echo and CBC. She has moderate COPD at baseline but notes mild increase in exertional dyspnea. O2 stats are 92% on RA. She is on Amiodarone. She has crackles at the bases on physical exam but they clear after deep coughing. No wheezing on physical exam. I don't think she needs a repeat CXR at this point. She has f/u with her pulmonologist Dr. Maple Hudson in 2 weeks. If breathing worsens and if echo is normal, then she may need a CXR.  3. Paroxsymal Afib: Recent PPM interrogation reveal that she has only been in Afib 0.1% of the time. EKG today demonstrates SR. HR 77 bpm today. Continue Wafarin for stroke prophylaxis. INR followed by PCP.    PLAN  Will obtain labs as mentioned above and will also check a 2D echo. F/u in 2 weeks.   Gearld Kerstein, BRITTAINYPA-C 12/28/2013 4:07 PM

## 2013-12-28 NOTE — Patient Instructions (Signed)
Your physician has requested that you have an echocardiogram. Echocardiography is a painless test that uses sound waves to create images of your heart. It provides your doctor with information about the size and shape of your heart and how well your heart's chambers and valves are working. This procedure takes approximately one hour. There are no restrictions for this procedure.  Your physician has ordered you to have blood work done.  Your physician recommends that you schedule a follow-up appointment in 2-3 weeks with Dr Royann Shiversroitoru.

## 2014-01-09 ENCOUNTER — Ambulatory Visit (HOSPITAL_COMMUNITY)
Admission: RE | Admit: 2014-01-09 | Discharge: 2014-01-09 | Disposition: A | Discharge status: Home / Self Care | Payer: Medicare Other | Source: Ambulatory Visit | Attending: Cardiovascular Disease | Admitting: Cardiovascular Disease

## 2014-01-09 DIAGNOSIS — R0989 Other specified symptoms and signs involving the circulatory and respiratory systems: Principal | ICD-10-CM | POA: Insufficient documentation

## 2014-01-09 DIAGNOSIS — R0600 Dyspnea, unspecified: Secondary | ICD-10-CM

## 2014-01-09 DIAGNOSIS — R0609 Other forms of dyspnea: Secondary | ICD-10-CM | POA: Insufficient documentation

## 2014-01-09 DIAGNOSIS — R0602 Shortness of breath: Secondary | ICD-10-CM

## 2014-01-09 DIAGNOSIS — I369 Nonrheumatic tricuspid valve disorder, unspecified: Secondary | ICD-10-CM

## 2014-01-09 DIAGNOSIS — R5383 Other fatigue: Secondary | ICD-10-CM

## 2014-01-09 DIAGNOSIS — R5381 Other malaise: Secondary | ICD-10-CM

## 2014-01-09 NOTE — Progress Notes (Signed)
2D Echo Performed 01/09/2014    Iracema Lanagan, RCS  

## 2014-01-10 LAB — CBC
HEMATOCRIT: 38.9 % (ref 36.0–46.0)
HEMOGLOBIN: 12.9 g/dL (ref 12.0–15.0)
MCH: 28 pg (ref 26.0–34.0)
MCHC: 33.2 g/dL (ref 30.0–36.0)
MCV: 84.6 fL (ref 78.0–100.0)
Platelets: 322 10*3/uL (ref 150–400)
RBC: 4.6 MIL/uL (ref 3.87–5.11)
RDW: 15.1 % (ref 11.5–15.5)
WBC: 8.4 10*3/uL (ref 4.0–10.5)

## 2014-01-10 LAB — BRAIN NATRIURETIC PEPTIDE: Brain Natriuretic Peptide: 56.1 pg/mL (ref 0.0–100.0)

## 2014-01-10 LAB — BASIC METABOLIC PANEL
BUN: 14 mg/dL (ref 6–23)
CALCIUM: 8.7 mg/dL (ref 8.4–10.5)
CO2: 29 mEq/L (ref 19–32)
CREATININE: 0.84 mg/dL (ref 0.50–1.10)
Chloride: 101 mEq/L (ref 96–112)
GLUCOSE: 84 mg/dL (ref 70–99)
POTASSIUM: 3.9 meq/L (ref 3.5–5.3)
Sodium: 144 mEq/L (ref 135–145)

## 2014-01-10 LAB — TSH: TSH: 2.201 u[IU]/mL (ref 0.350–4.500)

## 2014-01-12 ENCOUNTER — Encounter (INDEPENDENT_AMBULATORY_CARE_PROVIDER_SITE_OTHER): Payer: Medicare Other | Admitting: Ophthalmology

## 2014-01-12 DIAGNOSIS — H3530 Unspecified macular degeneration: Secondary | ICD-10-CM

## 2014-01-12 DIAGNOSIS — I10 Essential (primary) hypertension: Secondary | ICD-10-CM

## 2014-01-12 DIAGNOSIS — H35039 Hypertensive retinopathy, unspecified eye: Secondary | ICD-10-CM

## 2014-01-12 DIAGNOSIS — H35059 Retinal neovascularization, unspecified, unspecified eye: Secondary | ICD-10-CM

## 2014-01-12 DIAGNOSIS — H3553 Other dystrophies primarily involving the sensory retina: Secondary | ICD-10-CM

## 2014-01-19 ENCOUNTER — Ambulatory Visit (INDEPENDENT_AMBULATORY_CARE_PROVIDER_SITE_OTHER): Payer: Medicare Other | Admitting: Cardiovascular Disease

## 2014-01-19 ENCOUNTER — Encounter: Payer: Self-pay | Admitting: Cardiovascular Disease

## 2014-01-19 VITALS — BP 144/90 | HR 64 | Resp 16 | Ht 63.0 in | Wt 174.3 lb

## 2014-01-19 DIAGNOSIS — I5031 Acute diastolic (congestive) heart failure: Secondary | ICD-10-CM

## 2014-01-19 DIAGNOSIS — R0602 Shortness of breath: Secondary | ICD-10-CM

## 2014-01-19 DIAGNOSIS — I4891 Unspecified atrial fibrillation: Secondary | ICD-10-CM

## 2014-01-19 DIAGNOSIS — J8410 Pulmonary fibrosis, unspecified: Secondary | ICD-10-CM

## 2014-01-19 DIAGNOSIS — I509 Heart failure, unspecified: Secondary | ICD-10-CM

## 2014-01-19 DIAGNOSIS — Z79899 Other long term (current) drug therapy: Secondary | ICD-10-CM

## 2014-01-19 DIAGNOSIS — I480 Paroxysmal atrial fibrillation: Secondary | ICD-10-CM

## 2014-01-19 MED ORDER — FUROSEMIDE 40 MG PO TABS: 60.0000 mg | ORAL_TABLET | Freq: Every day | ORAL | Status: DC

## 2014-01-19 NOTE — Patient Instructions (Signed)
Your physician has recommended that you have a pulmonary function test. Pulmonary Function Tests are a group of tests that measure how well air moves in and out of your lungs.  INCREASE Furosemide to 1 1/2 tablets (60mg ) daily.  Your physician recommends that you return for lab work in: 2 weeks.  Your physician recommends that you weigh, daily, at the same time every day, and in the same amount of clothing. Please record your daily weights on the handout provided and bring it to your next appointment.   Dr. Royann Shiversroitoru recommends that you schedule a follow-up appointment in: 3 months

## 2014-01-20 ENCOUNTER — Encounter: Payer: Self-pay | Admitting: Cardiovascular Disease

## 2014-01-20 NOTE — Assessment & Plan Note (Signed)
Her burden of atrial fibrillation is very low and she has only had one episode worth mentioning in the recent past. This lasted only 22 minutes. Her shortness of breath is not secondary to her arrhythmia. She has had substantial benefit from treatment with the amiodarone in reducing the arrhythmia prevalence. However with her concurrent lung problems this is a potentially risky drug. It should be used in the lowest dose possible.

## 2014-01-20 NOTE — Assessment & Plan Note (Signed)
The cause for [name]' dyspnea can be hard to distinguish, but at least by echocardiography there is a suggestion that she has acute exacerbation of diastolic heart failure. I recommended that she increase her dose of furosemide and we will check a repeat metabolic panel and a BNP. There is concern that amiodarone could cause pulmonary fibrosis, but her lung problems appear to have started before treatment with this drug

## 2014-01-20 NOTE — Progress Notes (Signed)
Patient ID: [name], female   DOB: [birth year], 80 y.o.   MRN: [id number]     Reason for office visit Followup  pacemaker , history of atrial fibrillation   [name] was seen in our office one month ago by one of our physician assistants, Robbie Lis, with complaints of shortness of breath. She complains of severe fatigue and has to stop and rest doing regular household chores. She does not have chest discomfort and denies syncope or palpitations. She is on chronic amiodarone therapy, but her dyspnea precedes treatment with amiodarone. She has moderate obstructive airway disease by pulmonary function tests performed in January of this year and is followed by Dr. Jetty Duhamel  Her echocardiogram still shows normal left ventricular systolic, but the Doppler parameters of diastolic function suggested that she may be hypervolemic.  Recent pacemaker interrogation showed a burden of atrial fibrillation well under 0.1%, only one episode lasted more than a few seconds. It was only 22 minutes in duration. He has not had focal neurological events and denies bleeding complications on warfarin, other than easy bruising  In 2014, [name] underwent implantation of a dual-chamber permanent pacemaker for symptomatic atrial fibrillation with slow ventricular response and sinus node dysfunction. She does have a history of a previous stroke in October of 2013 and has undergone cardioversion at least twice for atrial fibrillation in the past. Her major comorbidity is COPD, possibly pulmonary fibrosis. She has a normal left ventricle systolic function, no history of coronary disease and only a mildly dilated left atrium. This suggests that her atrial fibrillation is very likely related to her pulmonary problems    Allergies  Allergen Reactions  . Amoxicillin     Can't remember reaction  . Doxycycline     Can't remember reaction  . Penicillins     Can't remember reaction  . Sulfa Antibiotics     Can't remember reaction    Current Outpatient Prescriptions  Medication Sig Dispense Refill  . acetaminophen (TYLENOL) 500 MG tablet Take 500 mg by mouth at bedtime as needed. For pain to help sleep      . amiodarone (PACERONE) 200 MG tablet Take 200 mg by mouth daily.      . beta carotene w/minerals (OCUVITE) tablet Take 1 tablet by mouth daily.      . calcium-vitamin D (OSCAL WITH D) 500-200 MG-UNIT per tablet Take 1 tablet by mouth.      Marland Kitchen FLUoxetine (PROZAC) 20 MG capsule Take 20 mg by mouth daily.      Marland Kitchen losartan (COZAAR) 50 MG tablet Take 50 mg by mouth daily.      . mirabegron ER (MYRBETRIQ) 50 MG TB24 tablet Take 50 mg by mouth daily.      . mirtazapine (REMERON) 15 MG tablet Take 15 mg by mouth at bedtime.      Marland Kitchen Umeclidinium-Vilanterol (ANORO ELLIPTA) 62.5-25 MCG/INH AEPB Inhale 1 puff, once daily  1 each  prn  . warfarin (COUMADIN) 5 MG tablet Take 2.5 mg by mouth See admin instructions. Take 1/2 tablet for three days then skip a day and repeat until INR is checked again next week.      . furosemide (LASIX) 40 MG tablet Take 1.5 tablets (60 mg total) by mouth daily.  135 tablet  3   No current facility-administered medications for this visit.    Past Medical History  Diagnosis Date  . Irregular heart beat   . Hypertension   . Hypercholesteremia   .  Depression   . Atrial fibrillation   . Bowel obstruction   . S/P cardiac pacemaker procedure, 02/03/13, Medtronic pacer 02/04/2013    sick sinus syndrome  . CVA (cerebral infarction)   . COPD (chronic obstructive pulmonary disease)   . Tobacco abuse     quit 2009  . History of echocardiogram 05/21/2012    TEE; EF 55-60%; mild grade 1 atherosclerosis of prox descending aorta & distal aortic arch  . History of nuclear stress test 11/07/2009    dipyridamole; normal pattern of perfusion; negative for ischemia; low risk   . Carotid artery disease     carotid duplex 10/2012 - bilat ICAs normla patency with mod tortuosity of vessel, L  vertebral artery demonstrated occlsuve disease    Past Surgical History  Procedure Laterality Date  . Tee without cardioversion  05/21/2012    Procedure: TRANSESOPHAGEAL ECHOCARDIOGRAM (TEE);  Surgeon: Chrystie Nose, MD;  Location: [facility] ENDOSCOPY;  Service: Cardiovascular;  Laterality: N/A;  . Cardioversion  05/21/2012    Procedure: CARDIOVERSION;  Surgeon: Chrystie Nose, MD;  Location: [facility] ENDOSCOPY;  Service: Cardiovascular;  Laterality: N/A;  . Abdominal surgery    . Tonsillectomy    . Appendectomy    . Abdominal hysterectomy    . Colporrhaphy      posterior  . Pacemaker insertion  07/07/2012    medtronic    Family History  Problem Relation Age of Onset  . Coronary artery disease Father   . Heart attack Brother   . Stomach cancer Paternal Grandmother     History   Social History  . Marital Status: Married    Spouse Name: N/A    Number of Children: 1  . Years of Education: N/A   Occupational History  . retired    Social History Main Topics  . Smoking status: Former Smoker -- 1.00 packs/day for 38 years    Types: Cigarettes    Start date: 06/02/1969    Quit date: 06/03/2007  . Smokeless tobacco: Never Used  . Alcohol Use: No  . Drug Use: No  . Sexual Activity: Not on file   Other Topics Concern  . 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  The patient also denies abdominal pain, nausea, vomiting, dysphagia, diarrhea, constipation, polyuria, polydipsia, dysuria, hematuria, frequency, urgency, abnormal bleeding or bruising, fever, chills, unexpected weight  changes, mood swings, change in skin or hair texture, change in voice quality, auditory or visual problems, allergic reactions or rashes, new musculoskeletal complaints other than usual "aches and pains".   PHYSICAL EXAM BP 144/90  Pulse 64  Resp 16  Ht 5\' 3"  (1.6 m)  Wt 174 lb 4.8 oz (79.062 kg)  BMI 30.88 kg/m2 General: Alert, oriented x3, no distress  Head: no evidence of trauma, PERRL, EOMI, no exophtalmos or lid lag, no myxedema, no xanthelasma; normal ears, nose and oropharynx  Neck: normal jugular venous pulsations and no hepatojugular reflux; brisk carotid pulses without delay and no carotid bruits  Chest: clear to auscultation, no signs of consolidation by percussion or palpation, normal fremitus, symmetrical and full respiratory excursions; well-healed left subclavian pacemaker site  Cardiovascular: normal position and  quality of the apical impulse, regular rhythm, normal first and second heart sounds, no murmurs, rubs or gallops  Abdomen: no tenderness or distention, no masses by palpation, no abnormal pulsatility or arterial bruits, normal bowel sounds, no hepatosplenomegaly  Extremities: no clubbing, cyanosis or edema; 2+ radial, ulnar and brachial pulses bilaterally; 2+ right femoral, posterior tibial and dorsalis pedis pulses; 2+ left femoral, posterior tibial and dorsalis pedis pulses; no subclavian or femoral bruits  Neurological: grossly nonfocal   EKG: Atrial paced, ventricular sensed, left anterior fascicular block, nonspecific repolarization abnormalities, cannot reliably measure QT interval due to baseline artifact, but the computerized measurement of 490 ms appears    BMET    Component Value Date/Time   NA 144 01/09/2014 1555   K 3.9 01/09/2014 1555   CL 101 01/09/2014 1555   CO2 29 01/09/2014 1555   GLUCOSE 84 01/09/2014 1555   BUN 14 01/09/2014 1555   CREATININE 0.84 01/09/2014 1555   CREATININE 0.81 02/03/2013 0450   CALCIUM 8.7 01/09/2014 1555   GFRNONAA 67*  02/03/2013 0450   GFRAA 78* 02/03/2013 0450     ASSESSMENT AND PLAN Acute diastolic congestive heart failure The cause for [name]' dyspnea can be hard to distinguish, but at least by echocardiography there is a suggestion that she has acute exacerbation of diastolic heart failure. I recommended that she increase her dose of furosemide and we will check a repeat metabolic panel and a BNP. There is concern that amiodarone could cause pulmonary fibrosis, but her lung problems appear to have started before treatment with this drug  PAF (paroxysmal atrial fibrillation) Her burden of atrial fibrillation is very low and she has only had one episode worth mentioning in the recent past. This lasted only 22 minutes. Her shortness of breath is not secondary to her arrhythmia. She has had substantial benefit from treatment with the amiodarone in reducing the arrhythmia prevalence. However with her concurrent lung problems this is a potentially risky drug. It should be used in the lowest dose possible.  Pulmonary fibrosis I had ordered pulmonary function tests for [name], but in looking through her records saw the doctor Maple Hudson had just perform these in January. We'll cancel. I will ask his opinion whether he thinks his any point in repeating them now. If she does not improve with diuretic therapy, may decide to discontinue the amiodarone.  Ultimately, the only reliable way to distinguish between pulmonary and cardiac cause of her shortness of breath might be right & left heart catheterization. Patient Instructions  Your physician has recommended that you have a pulmonary function test. Pulmonary Function Tests are a group of tests that measure how well air moves in and out of your lungs.  INCREASE Furosemide to 1 1/2 tablets (60mg ) daily.  Your physician recommends that you return for lab work in: 2 weeks.  Your physician recommends that you weigh, daily, at the same time every day, and in the same  amount of clothing. Please record your daily weights on the handout provided and bring it to your next appointment.   Dr. Royann Shivers recommends that you schedule a follow-up appointment in: 3 months       Orders Placed This Encounter  Procedures  . Basic Metabolic Panel (BMET)  . B Nat Peptide  . EKG 12-Lead  . Pulmonary function test   Meds ordered this encounter  Medications  . furosemide (LASIX) 40 MG tablet    Sig: Take 1.5 tablets (60 mg total) by mouth daily.  Dispense:  135 tablet    Refill:  3    Marlo Goodrich  Thurmon FairMihai Jessyca Sloan, MD, [facility] [phone] office [phone] pager

## 2014-01-20 NOTE — Assessment & Plan Note (Signed)
I had ordered pulmonary function tests for Mrs. [name], but in looking through her records saw the doctor Maple HudsonYoung had just perform these in January. We'll cancel. I will ask his opinion whether he thinks his any point in repeating them now. If she does not improve with diuretic therapy, may decide to discontinue the amiodarone.

## 2014-01-24 ENCOUNTER — Ambulatory Visit (HOSPITAL_COMMUNITY)
Admission: RE | Admit: 2014-01-24 | Discharge: 2014-01-24 | Disposition: A | Discharge status: Home / Self Care | Payer: Medicare Other | Source: Ambulatory Visit | Attending: Cardiovascular Disease | Admitting: Cardiovascular Disease

## 2014-01-24 DIAGNOSIS — R0602 Shortness of breath: Secondary | ICD-10-CM | POA: Diagnosis present

## 2014-01-24 LAB — PULMONARY FUNCTION TEST
DL/VA % pred: 57 %
DL/VA: 2.61 ml/min/mmHg/L
DLCO cor % pred: 38 %
DLCO cor: 8.35 ml/min/mmHg
DLCO unc % pred: 38 %
DLCO unc: 8.35 ml/min/mmHg
FEF 25-75 Post: 1.18 L/sec
FEF 25-75 Pre: 0.51 L/sec
FEF2575-%Change-Post: 131 %
FEF2575-%Pred-Post: 93 %
FEF2575-%Pred-Pre: 40 %
FEV1-%Change-Post: 21 %
FEV1-%Pred-Post: 84 %
FEV1-%Pred-Pre: 69 %
FEV1-Post: 1.47 L
FEV1-Pre: 1.2 L
FEV1FVC-%CHANGE-POST: -9 %
FEV1FVC-%Pred-Pre: 80 %
FEV6-%CHANGE-POST: 35 %
FEV6-%PRED-PRE: 88 %
FEV6-%Pred-Post: 119 %
FEV6-PRE: 1.95 L
FEV6-Post: 2.65 L
FEV6FVC-%Change-Post: 0 %
FEV6FVC-%PRED-POST: 103 %
FEV6FVC-%Pred-Pre: 102 %
FVC-%Change-Post: 34 %
FVC-%PRED-POST: 116 %
FVC-%Pred-Pre: 86 %
FVC-PRE: 2.03 L
FVC-Post: 2.73 L
POST FEV6/FVC RATIO: 97 %
Post FEV1/FVC ratio: 54 %
Pre FEV1/FVC ratio: 59 %
Pre FEV6/FVC Ratio: 96 %
RV % PRED: 92 %
RV: 2.13 L
TLC % PRED: 97 %
TLC: 4.63 L

## 2014-01-24 MED ORDER — ALBUTEROL SULFATE (2.5 MG/3ML) 0.083% IN NEBU
2.5000 mg | INHALATION_SOLUTION | Freq: Once | RESPIRATORY_TRACT | Status: AC
  Administered 2014-01-24: 2.5 mg via RESPIRATORY_TRACT

## 2014-01-25 ENCOUNTER — Telehealth: Payer: Self-pay | Admitting: *Deleted

## 2014-01-25 NOTE — Telephone Encounter (Signed)
Message copied by Vita Barley on Wed Jan 25, 2014  4:44 PM ------      Message from: Thurmon Fair      Created: Fri Jan 20, 2014  6:26 PM       Was reading through her records and found that she had pulmonary function tests with Dr. Maple Hudson just this January. I don't think we need to repeat them. Please cancel. ------

## 2014-01-26 ENCOUNTER — Encounter: Payer: Self-pay | Admitting: Internal Medicine

## 2014-01-26 ENCOUNTER — Ambulatory Visit (INDEPENDENT_AMBULATORY_CARE_PROVIDER_SITE_OTHER): Payer: Medicare Other | Admitting: Internal Medicine

## 2014-01-26 VITALS — BP 128/62 | HR 72 | Ht 62.5 in | Wt 175.8 lb

## 2014-01-26 DIAGNOSIS — J439 Emphysema, unspecified: Secondary | ICD-10-CM

## 2014-01-26 MED ORDER — COMPRESSOR/NEBULIZER MISC: Status: DC

## 2014-01-26 MED ORDER — IPRATROPIUM-ALBUTEROL 0.5-2.5 (3) MG/3ML IN SOLN: 3.0000 mL | Freq: Four times a day (QID) | RESPIRATORY_TRACT | Status: AC | PRN

## 2014-01-26 MED ORDER — LEVALBUTEROL HCL 0.63 MG/3ML IN NEBU
0.6300 mg | INHALATION_SOLUTION | Freq: Once | RESPIRATORY_TRACT | Status: AC
  Administered 2014-01-26: 0.63 mg via RESPIRATORY_TRACT

## 2014-01-26 NOTE — Progress Notes (Signed)
08/11/13- 35 yoF former smoker referred courtesy of Dr Dorann Lodge PFT Son "[name]" is here. She had been smoking less than one pack per day when she quit in 2009. She has been noticing shortness of breath with light to moderate activity, relieved by rest. This seems to have been going on for months up to a year or more. Minimal wheeze and little change from one day to the next. Little cough, no phlegm. Her ankles may swell occasionally. History of pneumonia one year ago and had flu in October 2014. Pacemaker for atrial fibrillation with slow VR. [facility] PFT: 06/22/2013-moderate obstructive airways disease with diffusion severely reduced.  FVC 2.08/87%, FEV1 1.17/66%, FEV1/FVC 0.56, FEF 25-75% 0.58/74%. Slight response to bronchodilator. Diffusion 41% of predicted. CXR 01/31/13 Findings: A left dual lead cardiac pacemaker has been placed. The  leads are in the region of the right ventricle and right atrium.  Negative for a pneumothorax. Lungs are clear without airspace  disease or pulmonary edema. Retrocardiac density is suggestive for  a small hiatal hernia. There are atherosclerotic calcifications at  the aortic arch. Heart size is normal.  IMPRESSION:  Placement of left chest cardiac pacemaker. Negative for  pneumothorax.  No acute chest findings.  Original Report Authenticated By: Richarda Overlie, M.D.  09/22/13- 79 yoF former smoker followed for dyspnea, COPD, dCHF, AFib/ pacemaker FOLLOWS FOR: Continues to use Anoro daily and can tell a difference. Son states patient seems to stay SOB alot. On direct questioning, it is not clear they see any benefit from Anoro. No acute issues. Breathing comfortably at night. Stable dyspnea on exertion with little cough or wheeze.  01/26/14- 60 yoF former smoker followed for COPD/emphysema, complicated by AF/pacemaker, dCHF FOLLOWS FOR: SOB with activity; slight wheezing as well.    ROS-see HPI Constitutional:   No-    weight loss, night sweats, fevers, chills, fatigue, lassitude. HEENT:   No-  headaches, difficulty swallowing, tooth/dental problems, sore throat,       No-  sneezing, itching, ear ache, nasal congestion, post nasal drip,  CV:  No-   chest pain, orthopnea, PND, swelling in lower extremities, anasarca,                                  dizziness, palpitations Resp: + shortness of breath with exertion or at rest.              No-   productive cough,  No non-productive cough,  No- coughing up of blood.              No-   change in color of mucus.  No- wheezing.   Skin: No-   rash or lesions. GI:  No-   heartburn, indigestion, abdominal pain, nausea, vomiting,  GU:  MS:  No-   joint pain or swelling.  . Neuro-     nothing unusual Psych:  No- change in mood or affect. No depression or anxiety.  No memory loss.  OBJ- Physical Exam General- +Seems vague- maybe just refecting her age, Oriented, Affect-appropriate, Distress-                    none acute,  Skin- rash-none, lesions- none, excoriation- none Lymphadenopathy- none Head- atraumatic            Eyes- Gross vision intact, PERRLA, conjunctivae and secretions clear  Ears- Hearing, canals-normal            Nose- Clear, no-Septal dev, mucus, polyps, erosion, perforation             Throat- Mallampati II , mucosa clear , drainage- none, tonsils- atrophic Neck- flexible , trachea midline, no stridor , thyroid nl, carotid no bruit Chest - symmetrical excursion , unlabored           Heart/CV- RRR , no murmur , no gallop  , no rub, nl s1 s2                           - JVD- none , edema- none, stasis changes- none, varices- none           Lung- clear to P&A/ few crackles, wheeze- none, cough- none , dullness-none, rub- none           Chest wall- L pacemaker Abd-  Br/ Gen/ Rectal- Not done, not indicated Extrem- cyanosis- none, clubbing, none, atrophy- none, strength- nl Neuro- grossly intact to observation

## 2014-01-26 NOTE — Patient Instructions (Addendum)
Order- DME for Medicare nebulizer and medication to give breathing treatments at home 0-4 times per day as needed for chest congestion/ shortness of breath   - dx COPD with emphysema    Try this instead of Anoro, which is more expensive.  Neb today with xopenex 0.63 today so you can see how it is done  Please call as needed  You can ask Dr Jeannetta Nap if you have had the pneumonia vaccine

## 2014-03-03 ENCOUNTER — Telehealth: Payer: Self-pay | Admitting: Internal Medicine

## 2014-03-03 DIAGNOSIS — J439 Emphysema, unspecified: Secondary | ICD-10-CM

## 2014-03-03 DIAGNOSIS — J8410 Pulmonary fibrosis, unspecified: Secondary | ICD-10-CM

## 2014-03-03 NOTE — Telephone Encounter (Signed)
Order placed to [facility].  

## 2014-03-08 ENCOUNTER — Other Ambulatory Visit: Payer: Self-pay | Admitting: Family Medicine

## 2014-03-08 DIAGNOSIS — R748 Abnormal levels of other serum enzymes: Secondary | ICD-10-CM

## 2014-03-16 ENCOUNTER — Ambulatory Visit
Admission: RE | Admit: 2014-03-16 | Discharge: 2014-03-16 | Disposition: A | Discharge status: Home / Self Care | Payer: Medicare Other | Source: Ambulatory Visit | Attending: Family Medicine | Admitting: Family Medicine

## 2014-03-16 DIAGNOSIS — R748 Abnormal levels of other serum enzymes: Secondary | ICD-10-CM

## 2014-03-24 ENCOUNTER — Encounter (HOSPITAL_COMMUNITY): Payer: Self-pay | Admitting: Emergency Medicine

## 2014-03-24 ENCOUNTER — Emergency Department (HOSPITAL_COMMUNITY)
Admission: EM | Admit: 2014-03-24 | Discharge: 2014-03-25 | Disposition: A | Discharge status: Home / Self Care | Payer: Medicare Other | Attending: Emergency Medicine | Admitting: Emergency Medicine

## 2014-03-24 ENCOUNTER — Emergency Department (HOSPITAL_COMMUNITY): Payer: Medicare Other

## 2014-03-24 DIAGNOSIS — Z79899 Other long term (current) drug therapy: Secondary | ICD-10-CM | POA: Diagnosis not present

## 2014-03-24 DIAGNOSIS — F329 Major depressive disorder, single episode, unspecified: Secondary | ICD-10-CM | POA: Insufficient documentation

## 2014-03-24 DIAGNOSIS — Z8673 Personal history of transient ischemic attack (TIA), and cerebral infarction without residual deficits: Secondary | ICD-10-CM | POA: Diagnosis not present

## 2014-03-24 DIAGNOSIS — J449 Chronic obstructive pulmonary disease, unspecified: Secondary | ICD-10-CM | POA: Diagnosis not present

## 2014-03-24 DIAGNOSIS — Z87891 Personal history of nicotine dependence: Secondary | ICD-10-CM | POA: Diagnosis not present

## 2014-03-24 DIAGNOSIS — Z7901 Long term (current) use of anticoagulants: Secondary | ICD-10-CM | POA: Diagnosis not present

## 2014-03-24 DIAGNOSIS — I4891 Unspecified atrial fibrillation: Secondary | ICD-10-CM | POA: Diagnosis not present

## 2014-03-24 DIAGNOSIS — Z880 Allergy status to penicillin: Secondary | ICD-10-CM | POA: Diagnosis not present

## 2014-03-24 DIAGNOSIS — Z8639 Personal history of other endocrine, nutritional and metabolic disease: Secondary | ICD-10-CM | POA: Diagnosis not present

## 2014-03-24 DIAGNOSIS — I10 Essential (primary) hypertension: Secondary | ICD-10-CM | POA: Insufficient documentation

## 2014-03-24 DIAGNOSIS — N12 Tubulo-interstitial nephritis, not specified as acute or chronic: Secondary | ICD-10-CM | POA: Diagnosis not present

## 2014-03-24 DIAGNOSIS — R079 Chest pain, unspecified: Secondary | ICD-10-CM | POA: Insufficient documentation

## 2014-03-24 DIAGNOSIS — Z8719 Personal history of other diseases of the digestive system: Secondary | ICD-10-CM | POA: Diagnosis not present

## 2014-03-24 DIAGNOSIS — Z950 Presence of cardiac pacemaker: Secondary | ICD-10-CM | POA: Insufficient documentation

## 2014-03-24 LAB — CBC WITH DIFFERENTIAL/PLATELET
Basophils Absolute: 0 10*3/uL (ref 0.0–0.1)
Basophils Relative: 0 % (ref 0–1)
EOS PCT: 1 % (ref 0–5)
Eosinophils Absolute: 0 10*3/uL (ref 0.0–0.7)
HCT: 38 % (ref 36.0–46.0)
HEMOGLOBIN: 12.5 g/dL (ref 12.0–15.0)
LYMPHS ABS: 1.6 10*3/uL (ref 0.7–4.0)
LYMPHS PCT: 21 % (ref 12–46)
MCH: 27.4 pg (ref 26.0–34.0)
MCHC: 32.9 g/dL (ref 30.0–36.0)
MCV: 83.3 fL (ref 78.0–100.0)
MONOS PCT: 8 % (ref 3–12)
Monocytes Absolute: 0.6 10*3/uL (ref 0.1–1.0)
NEUTROS PCT: 70 % (ref 43–77)
Neutro Abs: 5.5 10*3/uL (ref 1.7–7.7)
PLATELETS: 271 10*3/uL (ref 150–400)
RBC: 4.56 MIL/uL (ref 3.87–5.11)
RDW: 14.1 % (ref 11.5–15.5)
WBC: 7.8 10*3/uL (ref 4.0–10.5)

## 2014-03-24 LAB — URINALYSIS, ROUTINE W REFLEX MICROSCOPIC
Bilirubin Urine: NEGATIVE
Glucose, UA: NEGATIVE mg/dL
KETONES UR: 15 mg/dL — AB
NITRITE: POSITIVE — AB
PROTEIN: NEGATIVE mg/dL
Specific Gravity, Urine: 1.021 (ref 1.005–1.030)
UROBILINOGEN UA: 0.2 mg/dL (ref 0.0–1.0)
pH: 5.5 (ref 5.0–8.0)

## 2014-03-24 LAB — BASIC METABOLIC PANEL
Anion gap: 15 (ref 5–15)
BUN: 14 mg/dL (ref 6–23)
CO2: 26 meq/L (ref 19–32)
Calcium: 9.1 mg/dL (ref 8.4–10.5)
Chloride: 102 mEq/L (ref 96–112)
Creatinine, Ser: 0.89 mg/dL (ref 0.50–1.10)
GFR calc Af Amer: 69 mL/min — ABNORMAL LOW (ref >90)
GFR calc non Af Amer: 60 mL/min — ABNORMAL LOW (ref >90)
GLUCOSE: 92 mg/dL (ref 70–99)
POTASSIUM: 3.2 meq/L — AB (ref 3.7–5.3)
SODIUM: 143 meq/L (ref 137–147)

## 2014-03-24 LAB — URINE MICROSCOPIC-ADD ON

## 2014-03-24 LAB — TROPONIN I: Troponin I: <0.3 ng/mL (ref <0.30)

## 2014-03-24 LAB — APTT: APTT: 39 s — AB (ref 24–37)

## 2014-03-24 LAB — PROTIME-INR
INR: 2.48 — ABNORMAL HIGH (ref 0.00–1.49)
PROTHROMBIN TIME: 27 s — AB (ref 11.6–15.2)

## 2014-03-24 MED ORDER — OXYCODONE-ACETAMINOPHEN 5-325 MG PO TABS
1.0000 | ORAL_TABLET | Freq: Once | ORAL | Status: AC
  Administered 2014-03-24: 1 via ORAL
  Filled 2014-03-24: qty 1

## 2014-03-24 MED ORDER — FENTANYL CITRATE 0.05 MG/ML IJ SOLN: 50.0000 ug | Freq: Once | INTRAMUSCULAR | Status: DC

## 2014-03-24 MED ORDER — FENTANYL CITRATE 0.05 MG/ML IJ SOLN
25.0000 ug | Freq: Once | INTRAMUSCULAR | Status: AC
  Administered 2014-03-24: 25 ug via INTRAVENOUS
  Filled 2014-03-24: qty 2

## 2014-03-24 MED ORDER — METHOCARBAMOL 500 MG PO TABS
500.0000 mg | ORAL_TABLET | Freq: Once | ORAL | Status: AC
  Administered 2014-03-24: 500 mg via ORAL
  Filled 2014-03-24: qty 1

## 2014-03-24 MED ORDER — ALBUTEROL SULFATE (2.5 MG/3ML) 0.083% IN NEBU
5.0000 mg | INHALATION_SOLUTION | RESPIRATORY_TRACT | Status: DC | PRN
  Administered 2014-03-24 (×2): 5 mg via RESPIRATORY_TRACT
  Filled 2014-03-24 (×2): qty 6

## 2014-03-24 MED ORDER — ONDANSETRON 4 MG PO TBDP: 4.0000 mg | ORAL_TABLET | Freq: Three times a day (TID) | ORAL | Status: DC | PRN

## 2014-03-24 MED ORDER — TRAMADOL HCL 50 MG PO TABS
50.0000 mg | ORAL_TABLET | Freq: Four times a day (QID) | ORAL | Status: DC | PRN
Start: 2014-03-24 — End: 2014-05-04

## 2014-03-24 MED ORDER — CEPHALEXIN 500 MG PO CAPS: 500.0000 mg | ORAL_CAPSULE | Freq: Two times a day (BID) | ORAL | Status: DC

## 2014-03-24 MED ORDER — DEXTROSE 5 % IV SOLN
1.0000 g | Freq: Once | INTRAVENOUS | Status: AC
  Administered 2014-03-24: 1 g via INTRAVENOUS
  Filled 2014-03-24: qty 10

## 2014-03-24 NOTE — ED Notes (Signed)
Pt. Reports having lt. Lateral rib pain began this am, intermittent sharp with sob noted.  Pt. Reports pain increases with movement and deep breath. Denies any n/v or dizziness. Skin is p/w/d,  Pt. Is in NAD

## 2014-03-24 NOTE — ED Provider Notes (Signed)
CSN: [id number]     Arrival date & time 03/24/14  1505 History   First MD Initiated Contact with Patient 03/24/14 1511     Chief Complaint  Patient presents with  . Chest Pain     (Consider location/radiation/quality/duration/timing/severity/associated sxs/prior Treatment) HPI Comments: Pt with hx of afib, cad, cva, s/p pacemaker and on coumadin comes in with cc of L lateral chest pain. Pain is not anterior. Pain started y'day, wore off, and restarted this AM . Pain is described as sharp pain, intermittent with no specific aggravating, provoking factors. Pt has no cough. No fevers, chills. No falls. Pain is non radiating.  Patient is a 80 y.o. female presenting with chest pain. The history is provided by the patient and medical records.  Chest Pain Associated symptoms: no abdominal pain, no cough, no dizziness, no fever, no headache, no nausea, no palpitations, no shortness of breath and not vomiting     Past Medical History  Diagnosis Date  . Irregular heart beat   . Hypertension   . Hypercholesteremia   . Depression   . Atrial fibrillation   . Bowel obstruction   . S/P cardiac pacemaker procedure, 02/03/13, [employer]tr[employer] pacer 02/04/2013    sick sinus syndrome  . CVA (cerebral infarction)   . COPD (chronic obstructive pulmonary disease)   . Tobacco abuse     quit 2009  . History of echocardiogram 05/21/2012    TEE; EF 55-60%; mild grade 1 atherosclerosis of prox descending aorta & distal aortic arch  . History of nuclear stress test 11/07/2009    dipyridamole; normal pattern of perfusion; negative for ischemia; low risk   . Carotid artery disease     carotid duplex 10/2012 - bilat ICAs normla patency with mod tortuosity of vessel, L vertebral artery demonstrated occlsuve disease   Past Surgical History  Procedure Laterality Date  . Tee without cardioversion  05/21/2012    Procedure: TRANSESOPHAGEAL ECHOCARDIOGRAM (TEE);  Surgeon: Chrystie NoseKenneth C. Hilty, MD;  [facility];   Service: Cardiovascular;  Laterality: N/A;  . Cardioversion  05/21/2012    Procedure: CARDIOVERSION;  Surgeon: Chrystie NoseKenneth C. Hilty, MD;  [facility];  Service: Cardiovascular;  Laterality: N/A;  . Abdominal surgery    . Tonsillectomy    . Appendectomy    . Abdominal hysterectomy    . Colporrhaphy      posterior  . Pacemaker insertion  07/07/2012    medtronic   Family History  Problem Relation Age of Onset  . Coronary artery disease Father   . Heart attack Brother   . Stomach cancer Paternal Grandmother    History  Substance Use Topics  . Smoking status: Former Smoker -- 1.00 packs/day for 38 years    Types: Cigarettes    Start date: 06/02/1969    Quit date: 06/03/2007  . Smokeless tobacco: Never Used  . Alcohol Use: No   OB History   Grav Para Term Preterm Abortions TAB SAB Ect Mult Living                 Review of Systems  Constitutional: Negative for fever and activity change.  Respiratory: Negative for cough, shortness of breath and wheezing.   Cardiovascular: Positive for chest pain. Negative for palpitations.  Gastrointestinal: Negative for nausea, vomiting and abdominal pain.  Genitourinary: Negative for dysuria, frequency and flank pain.  Musculoskeletal: Negative for neck pain.  Skin: Negative for rash.  Allergic/Immunologic: Negative for immunocompromised state.  Neurological: Negative for dizziness and headaches.  Hematological:  Bruises/bleeds easily.      Allergies  Amoxicillin; Doxycycline; Penicillins; and Sulfa antibiotics  Home Medications   Prior to Admission medications   Medication Sig Start Date End Date Taking? Authorizing Provider  acetaminophen (TYLENOL) 500 MG tablet Take 500 mg by mouth at bedtime as needed for moderate pain. For pain to help sleep   Yes Historical Provider, MD  amiodarone (PACERONE) 200 MG tablet Take 200 mg by mouth daily.   Yes Historical Provider, MD  beta carotene w/minerals (OCUVITE) tablet Take 1 tablet by  mouth daily.   Yes Historical Provider, MD  calcium-vitamin D (OSCAL WITH D) 500-200 MG-UNIT per tablet Take 1 tablet by mouth daily with breakfast.    Yes Historical Provider, MD  FLUoxetine (PROZAC) 20 MG capsule Take 20 mg by mouth daily.   Yes Historical Provider, MD  furosemide (LASIX) 40 MG tablet Take 1.5 tablets (60 mg total) by mouth daily. 01/19/14  Yes Mihai Croitoru, MD  ipratropium-albuterol (DUONEB) 0.5-2.5 (3) MG/3ML SOLN Take 3 mLs by nebulization every 6 (six) hours as needed. 01/26/14  Yes Waymon Budge, MD  losartan (COZAAR) 50 MG tablet Take 50 mg by mouth daily.   Yes Historical Provider, MD  mirabegron ER (MYRBETRIQ) 50 MG TB24 tablet Take 50 mg by mouth daily.   Yes Historical Provider, MD  mirtazapine (REMERON) 15 MG tablet Take 15 mg by mouth at bedtime.   Yes Historical Provider, MD  warfarin (COUMADIN) 5 MG tablet Take 2.5 mg by mouth daily at 6 PM.    Yes Historical Provider, MD   BP 144/60  Pulse 64  Temp(Src) 98.1 F (36.7 C) (Oral)  Resp 13  SpO2 94% Physical Exam  ED Course  Procedures (including critical care time) Labs Review Labs Reviewed  BASIC METABOLIC PANEL - Abnormal; Notable for the following:    Potassium 3.2 (*)    GFR calc non Af Amer 60 (*)    GFR calc Af Amer 69 (*)    All other components within normal limits  APTT - Abnormal; Notable for the following:    aPTT 39 (*)    All other components within normal limits  PROTIME-INR - Abnormal; Notable for the following:    Prothrombin Time 27.0 (*)    INR 2.48 (*)    All other components within normal limits  CBC WITH DIFFERENTIAL  TROPONIN I  TROPONIN I  URINALYSIS, ROUTINE W REFLEX MICROSCOPIC    Imaging Review Dg Ribs Unilateral W/chest Left  03/24/2014   CLINICAL DATA:  Lower lateral left rib pain for 2 days.  No trauma.  EXAM: LEFT RIBS AND CHEST - 3+ VIEW  COMPARISON:  Chest x-ray February 04, 2013  FINDINGS: No fracture or other bone lesions are seen involving the ribs. There  is no evidence of pneumothorax or pleural effusion. Both lungs are clear. Heart size and mediastinal contours are stable. Cardiac pacemaker is unchanged. Heart size is enlarged. The aorta is tortuous.  IMPRESSION: No acute fracture or dislocation of left ribs.   Electronically Signed   By: Sherian Rein M.D.   On: 03/24/2014 16:14     EKG Interpretation   Date/Time:  Friday March 24 2014 15:01:25 EDT Ventricular Rate:  65 PR Interval:  196 QRS Duration: 125 QT Interval:  488 QTC Calculation: 507 R Axis:   -61 Text Interpretation:  Atrial-paced rhythm Nonspecific IVCD with LAD  Borderline T abnormalities, anterior leads Unchanged paced rhythm  Confirmed by Rhunette Croft, MD, Janey Genta [phone]) on 03/24/2014  3:33:05 PM      8:29 PM Pt has neg trops x 2, no new ekg findings, and the CXR is clear, no fractures. Despite this, she is still having pain. Pain is reproducible, and is convincing for musculoskeletal pain. Patient is noted to be wheezing initially, clear lungs now, she still has mild tachypnea, but both pt and son reports that the tachypnea is normal for her. Will get pain in better control. There is no shingles evidence based on exam, pt agrees to see pcp soon, and to return to the ER if her sx get worse. Anticipating discharge post improved pain control.  MDM   Final diagnoses:  Chest pain    Differential diagnosis includes: ACS syndrome CHF exacerbation Valvular disorder Myocarditis Pericarditis Pericardial effusion Pneumonia Pleural effusion Pulmonary edema PE Anemia Musculoskeletal pain  Pt comes in with cc of chest pain. Chest pain is left sided and positional, and worse with palpation and laying on the side, L. Pain is all lateral and posterior. No anterior component, and we have low concerns for CAD, but trops x2 ordered. Also PE considered, but less likely, as she is on coumadin, and there really is no cough, and the pain is bran new today. Pulm infarct or  hemorrhage also considered in the ddx, due to sudden onset of the pain, but with normal hemodynamics and resp status - neither of those dx will lead to change in management. CXR shows no PNA and with the lung exam being normal, no cough or fevers, we dont think clinically this is a pna. Will continue to monitor.   10:57 PM UA is +. Atypical for pyelonephritis - but that makes the most sense besides chest wall pain. IV ceftriaxone in the ER. Return precautions discussed with pt and the son, and they are stable for discharge.   Derwood KaplanAnkit Akari Defelice, MD 03/24/14 2258

## 2014-03-24 NOTE — Discharge Instructions (Signed)
We saw you in the ER for the left sided back pain/flank pain. All the results in the ER are normal, labs and imaging - except for there is infection in the urine, and we suspect that there might be infection in the kidney causing the pain you have. The workup in the ER is not complete, and is limited to screening for life threatening and emergent conditions only, so please see a primary care doctor for further evaluation.  Please return to the ER if your symptoms worsen; you have increased pain, fevers, chills, inability to keep any medications down, confusion. Otherwise see the outpatient doctor as requested.   Chest Wall Pain Chest wall pain is pain in or around the bones and muscles of your chest. It may take up to 6 weeks to get better. It may take longer if you must stay physically active in your work and activities.  CAUSES  Chest wall pain may happen on its own. However, it may be caused by:  A viral illness like the flu.  Injury.  Coughing.  Exercise.  Arthritis.  Fibromyalgia.  Shingles. HOME CARE INSTRUCTIONS   Avoid overtiring physical activity. Try not to strain or perform activities that cause pain. This includes any activities using your chest or your abdominal and side muscles, especially if heavy weights are used.  Put ice on the sore area.  Put ice in a plastic bag.  Place a towel between your skin and the bag.  Leave the ice on for 15-20 minutes per hour while awake for the first 2 days.  Only take over-the-counter or prescription medicines for pain, discomfort, or fever as directed by your caregiver. SEEK IMMEDIATE MEDICAL CARE IF:   Your pain increases, or you are very uncomfortable.  You have a fever.  Your chest pain becomes worse.  You have new, unexplained symptoms.  You have nausea or vomiting.  You feel sweaty or lightheaded.  You have a cough with phlegm (sputum), or you cough up blood. MAKE SURE YOU:   Understand these  instructions.  Will watch your condition.  Will get help right away if you are not doing well or get worse. Document Released: 05/19/2005 Document Revised: 08/11/2011 Document Reviewed: 01/13/2011 [facility] Patient Information 2015 [place], [place]. This information is not intended to replace advice given to you by your health care provider. Make sure you discuss any questions you have with your health care provider.  Chest Pain (Nonspecific) It is often hard to give a specific diagnosis for the cause of chest pain. There is always a chance that your pain could be related to something serious, such as a heart attack or a blood clot in the lungs. You need to follow up with your health care provider for further evaluation. CAUSES   Heartburn.  Pneumonia or bronchitis.  Anxiety or stress.  Inflammation around your heart (pericarditis) or lung (pleuritis or pleurisy).  A blood clot in the lung.  A collapsed lung (pneumothorax). It can develop suddenly on its own (spontaneous pneumothorax) or from trauma to the chest.  Shingles infection (herpes zoster virus). The chest wall is composed of bones, muscles, and cartilage. Any of these can be the source of the pain.  The bones can be bruised by injury.  The muscles or cartilage can be strained by coughing or overwork.  The cartilage can be affected by inflammation and become sore (costochondritis). DIAGNOSIS  Lab tests or other studies may be needed to find the cause of your pain. Your  health care provider may have you take a test called an ambulatory electrocardiogram (ECG). An ECG records your heartbeat patterns over a 24-hour period. You may also have other tests, such as:  Transthoracic echocardiogram (TTE). During echocardiography, sound waves are used to evaluate how blood flows through your heart.  Transesophageal echocardiogram (TEE).  Cardiac monitoring. This allows your health care provider to monitor your heart rate and rhythm  in real time.  Holter monitor. This is a portable device that records your heartbeat and can help diagnose heart arrhythmias. It allows your health care provider to track your heart activity for several days, if needed.  Stress tests by exercise or by giving medicine that makes the heart beat faster. TREATMENT   Treatment depends on what may be causing your chest pain. Treatment may include:  Acid blockers for heartburn.  Anti-inflammatory medicine.  Pain medicine for inflammatory conditions.  Antibiotics if an infection is present.  You may be advised to change lifestyle habits. This includes stopping smoking and avoiding alcohol, caffeine, and chocolate.  You may be advised to keep your head raised (elevated) when sleeping. This reduces the chance of acid going backward from your stomach into your esophagus. Most of the time, nonspecific chest pain will improve within 2-3 days with rest and mild pain medicine.  HOME CARE INSTRUCTIONS   If antibiotics were prescribed, take them as directed. Finish them even if you start to feel better.  For the next few days, avoid physical activities that bring on chest pain. Continue physical activities as directed.  Do not use any tobacco products, including cigarettes, chewing tobacco, or electronic cigarettes.  Avoid drinking alcohol.  Only take medicine as directed by your health care provider.  Follow your health care provider's suggestions for further testing if your chest pain does not go away.  Keep any follow-up appointments you made. If you do not go to an appointment, you could develop lasting (chronic) problems with pain. If there is any problem keeping an appointment, call to reschedule. SEEK MEDICAL CARE IF:   Your chest pain does not go away, even after treatment.  You have a rash with blisters on your chest.  You have a fever. SEEK IMMEDIATE MEDICAL CARE IF:   You have increased chest pain or pain that spreads to your  arm, neck, jaw, back, or abdomen.  You have shortness of breath.  You have an increasing cough, or you cough up blood.  You have severe back or abdominal pain.  You feel nauseous or vomit.  You have severe weakness.  You faint.  You have chills. This is an emergency. Do not wait to see if the pain will go away. Get medical help at once. Call your local emergency services (911 in [place] Do not drive yourself to the hospital. MAKE SURE YOU:   Understand these instructions.  Will watch your condition.  Will get help right away if you are not doing well or get worse. Document Released: 02/26/2005 Document Revised: 05/24/2013 Document Reviewed: 12/23/2007 [facility] Patient Information 2015 [place], [place]. This information is not intended to replace advice given to you by your health care provider. Make sure you discuss any questions you have with your health care provider.  Pyelonephritis, Adult Pyelonephritis is a kidney infection. A kidney infection can happen quickly, or it can last for a long time. HOME CARE   Take your medicine (antibiotics) as told. Finish it even if you start to feel better.  Keep all doctor visits as  told.  Drink enough fluids to keep your pee (urine) clear or pale yellow.  Only take medicine as told by your doctor. GET HELP RIGHT AWAY IF:   You have a fever or lasting symptoms for more than 2-3 days.  You have a fever and your symptoms suddenly get worse.  You cannot take your medicine or drink fluids as told.  You have chills and shaking.  You feel very weak or pass out (faint).  You do not feel better after 2 days. MAKE SURE YOU:  Understand these instructions.  Will watch your condition.  Will get help right away if you are not doing well or get worse. Document Released: 06/26/2004 Document Revised: 11/18/2011 Document Reviewed: 11/06/2010 [facility] Patient Information 2015 [place], [place]. This information is not intended to replace  advice given to you by your health care provider. Make sure you discuss any questions you have with your health care provider.

## 2014-04-12 ENCOUNTER — Encounter: Payer: Self-pay | Admitting: *Deleted

## 2014-05-04 ENCOUNTER — Encounter: Payer: Self-pay | Admitting: Internal Medicine

## 2014-05-04 ENCOUNTER — Ambulatory Visit (INDEPENDENT_AMBULATORY_CARE_PROVIDER_SITE_OTHER): Payer: Medicare Other | Admitting: Internal Medicine

## 2014-05-04 VITALS — BP 136/80 | HR 82 | Ht 62.5 in | Wt 178.2 lb

## 2014-05-04 DIAGNOSIS — J9691 Respiratory failure, unspecified with hypoxia: Secondary | ICD-10-CM

## 2014-05-04 DIAGNOSIS — J8410 Pulmonary fibrosis, unspecified: Secondary | ICD-10-CM

## 2014-05-04 NOTE — Progress Notes (Signed)
08/11/13- [age] yoF former smoker referred courtesy of Dr Dorann LodgeElkins-abnormal PFT Son "[name]" is here. She had been smoking less than one pack per day when she quit in 2009. She has been noticing shortness of breath with light to moderate activity, relieved by rest. This seems to have been going on for months up to a year or more. Minimal wheeze and little change from one day to the next. Little cough, no phlegm. Her ankles may swell occasionally. History of pneumonia one year ago and had flu in October 2014. Pacemaker for atrial fibrillation with slow VR. [facility] PFT: 06/22/2013-moderate obstructive airways disease with diffusion severely reduced.  FVC 2.08/87%, FEV1 1.17/66%, FEV1/FVC 0.56, FEF 25-75% 0.58/74%. Slight response to bronchodilator. Diffusion 41% of predicted. CXR 01/31/13 Findings: A left dual lead cardiac pacemaker has been placed. The  leads are in the region of the right ventricle and right atrium.  Negative for a pneumothorax. Lungs are clear without airspace  disease or pulmonary edema. Retrocardiac density is suggestive for  a small hiatal hernia. There are atherosclerotic calcifications at  the aortic arch. Heart size is normal.  IMPRESSION:  Placement of left chest cardiac pacemaker. Negative for  pneumothorax.  No acute chest findings.  Original Report Authenticated By: Richarda OverlieAdam Henn, M.D.  09[birth year]- [age] yoF former smoker followed for dyspnea, COPD, dCHF, AFib/ pacemaker FOLLOWS FOR: Continues to use Anoro daily and can tell a difference. Son states patient seems to stay SOB alot. On direct questioning, it is not clear they see any benefit from Anoro. No acute issues. Breathing comfortably at night. Stable dyspnea on exertion with little cough or wheeze.  01/26/14- [age] yoF former smoker followed for COPD/emphysema, complicated by AF/pacemaker, dCHF FOLLOWS FOR: SOB with activity; slight wheezing as well.  05/04/14- [age] yoF former smoker followed for  COPD/emphysema, complicated by AF/pacemaker, dCHF          son here FOLLOWS FOR: Has been having episodes where she gets very weak and has to lie down-unsure if related to breathing or blood levels. Using nebulizer twice daily-some help. Not much cough. Continues to sleep with O2 2L "some" Had CXR during workup for flank pain-turned out to be UTI PFT 01/24/14- mild obstructive airways disease, severe diffusion deficit. 38%  CXR 03/24/14- FINDINGS: No fracture or other bone lesions are seen involving the ribs. There is no evidence of pneumothorax or pleural effusion. Both lungs are clear. Heart size and mediastinal contours are stable. Cardiac pacemaker is unchanged. Heart size is enlarged. The aorta is Tortuous. IMPRESSION: No acute fracture or dislocation of left ribs. Electronically Signed  By: Sherian ReinWei-Chen Lin M.D.  On: 03/24/2014 16:14  ROS-see HPI Constitutional:   No-   weight loss, night sweats, fevers, chills, fatigue, lassitude. HEENT:   No-  headaches, difficulty swallowing, tooth/dental problems, sore throat,       No-  sneezing, itching, ear ache, nasal congestion, post nasal drip,  CV:  No-   chest pain, orthopnea, PND, swelling in lower extremities, anasarca,                                  dizziness, palpitations Resp: + shortness of breath with exertion or at rest.              No-   productive cough,  No non-productive cough,  No- coughing up of blood.  No-   change in color of mucus.  No- wheezing.   Skin: No-   rash or lesions. GI:  No-   heartburn, indigestion, abdominal pain, nausea, vomiting,  GU:  MS:  No-   joint pain or swelling.  . Neuro-     nothing unusual Psych:  No- change in mood or affect. No depression or anxiety.  No memory loss.  OBJ- Physical Exam General- +Seems vague- maybe just refecting her age, Oriented, but looks to                Son for answers to almost every question Affect-appropriate,                  Distress-  none  acute,  Skin- rash-none, lesions- none, excoriation- none Lymphadenopathy- none Head- atraumatic            Eyes- Gross vision intact, PERRLA, conjunctivae and secretions clear            Ears- Hearing, canals-normal            Nose- Clear, no-Septal dev, mucus, polyps, erosion, perforation             Throat- Mallampati II , mucosa clear , drainage- none, tonsils- atrophic Neck- flexible , trachea midline, no stridor , thyroid nl, carotid no bruit Chest - symmetrical excursion , unlabored           Heart/CV- RRR , no murmur , no gallop  , no rub, nl s1 s2                           - JVD- none , edema- none, stasis changes- none, varices- none           Lung- clear to P&A, wheeze- none, cough- none , dullness-none, rub- none           Chest wall- L pacemaker Abd-  Br/ Gen/ Rectal- Not done, not indicated Extrem- cyanosis- none, clubbing, none, atrophy- none, strength- nl Neuro- grossly intact to observation

## 2014-05-04 NOTE — Assessment & Plan Note (Signed)
CXR does not suggest significant interstitial disease although mild disturbance might be visible on CT scan

## 2014-05-04 NOTE — Assessment & Plan Note (Signed)
Low diffusion capacity may reflect emphysema without much proximal airways disease, or ventilation/perfusion mismatch due to her cardiovascular disease. Plan-continue nebulizer twice daily because she seems to be able to do that.

## 2014-05-04 NOTE — Patient Instructions (Signed)
Ok to continue O2 for sleep at 2L /Minute. I suggest you wear it every night while you sleep.  Ok to continue the nebulizer treatments for your breathing medicine, twice daily. If you are having a bad day, like with a chest cold, you can use the nebulizer up to 4 times daily if needed.  Check with Dr Jeannetta NapElkins if you keep having the weak spells. He may want you to see your cardiologist.  Please call if we can help.

## 2014-05-11 ENCOUNTER — Encounter (HOSPITAL_COMMUNITY): Payer: Self-pay | Admitting: Cardiovascular Disease

## 2014-05-30 ENCOUNTER — Encounter: Payer: Self-pay | Admitting: *Deleted

## 2014-06-30 ENCOUNTER — Encounter: Payer: Self-pay | Admitting: *Deleted

## 2014-07-20 ENCOUNTER — Ambulatory Visit (INDEPENDENT_AMBULATORY_CARE_PROVIDER_SITE_OTHER): Payer: Medicare Other | Admitting: Ophthalmology

## 2014-07-20 DIAGNOSIS — I10 Essential (primary) hypertension: Secondary | ICD-10-CM | POA: Diagnosis not present

## 2014-07-20 DIAGNOSIS — H3553 Other dystrophies primarily involving the sensory retina: Secondary | ICD-10-CM

## 2014-07-20 DIAGNOSIS — H35033 Hypertensive retinopathy, bilateral: Secondary | ICD-10-CM | POA: Diagnosis not present

## 2014-07-20 DIAGNOSIS — H3531 Nonexudative age-related macular degeneration: Secondary | ICD-10-CM

## 2014-07-20 DIAGNOSIS — H43813 Vitreous degeneration, bilateral: Secondary | ICD-10-CM

## 2014-07-27 ENCOUNTER — Encounter: Payer: Self-pay | Admitting: *Deleted

## 2014-08-31 ENCOUNTER — Encounter: Payer: Self-pay | Admitting: *Deleted

## 2014-09-27 ENCOUNTER — Encounter: Payer: Self-pay | Admitting: *Deleted

## 2014-10-18 ENCOUNTER — Ambulatory Visit (INDEPENDENT_AMBULATORY_CARE_PROVIDER_SITE_OTHER): Payer: Medicare Other | Admitting: *Deleted

## 2014-10-18 DIAGNOSIS — I495 Sick sinus syndrome: Secondary | ICD-10-CM

## 2014-10-20 NOTE — Progress Notes (Signed)
Remote pacemaker transmission.   

## 2014-10-31 LAB — CUP PACEART REMOTE DEVICE CHECK
Battery Impedance: 100 Ohm
Battery Voltage: 2.79 V
Brady Statistic AP VP Percent: 0 %
Brady Statistic AP VS Percent: 96 %
Brady Statistic AS VP Percent: 0 %
Brady Statistic AS VS Percent: 3 %
Lead Channel Impedance Value: 475 Ohm
Lead Channel Impedance Value: 552 Ohm
Lead Channel Pacing Threshold Amplitude: 1 V
Lead Channel Pacing Threshold Amplitude: 1 V
Lead Channel Pacing Threshold Pulse Width: 0.4 ms
Lead Channel Pacing Threshold Pulse Width: 0.4 ms
Lead Channel Setting Pacing Amplitude: 1.5 V
Lead Channel Setting Pacing Amplitude: 2 V
Lead Channel Setting Sensing Sensitivity: 2 mV
MDC IDC MSMT BATTERY REMAINING LONGEVITY: 163 mo
MDC IDC MSMT LEADCHNL RV SENSING INTR AMPL: 5.6 mV
MDC IDC SESS DTM: 20160518193841
MDC IDC SET LEADCHNL RV PACING PULSEWIDTH: 0.4 ms

## 2014-11-02 ENCOUNTER — Ambulatory Visit: Payer: Medicare Other | Admitting: Internal Medicine

## 2014-11-03 ENCOUNTER — Encounter: Payer: Self-pay | Admitting: *Deleted

## 2014-11-10 ENCOUNTER — Encounter: Payer: Self-pay | Admitting: Cardiology

## 2014-11-14 ENCOUNTER — Encounter: Payer: Self-pay | Admitting: Cardiovascular Disease

## 2015-01-15 ENCOUNTER — Encounter: Payer: Self-pay | Admitting: Internal Medicine

## 2015-01-15 ENCOUNTER — Ambulatory Visit (INDEPENDENT_AMBULATORY_CARE_PROVIDER_SITE_OTHER): Payer: Medicare Other | Admitting: Internal Medicine

## 2015-01-15 VITALS — BP 128/74 | HR 79 | Ht 62.5 in | Wt 182.4 lb

## 2015-01-15 DIAGNOSIS — J9691 Respiratory failure, unspecified with hypoxia: Secondary | ICD-10-CM | POA: Diagnosis not present

## 2015-01-15 DIAGNOSIS — J9611 Chronic respiratory failure with hypoxia: Secondary | ICD-10-CM

## 2015-01-15 DIAGNOSIS — J432 Centrilobular emphysema: Secondary | ICD-10-CM

## 2015-01-15 DIAGNOSIS — J8410 Pulmonary fibrosis, unspecified: Secondary | ICD-10-CM | POA: Diagnosis not present

## 2015-01-15 DIAGNOSIS — I4891 Unspecified atrial fibrillation: Secondary | ICD-10-CM

## 2015-01-15 DIAGNOSIS — I5031 Acute diastolic (congestive) heart failure: Secondary | ICD-10-CM

## 2015-01-15 NOTE — Progress Notes (Signed)
08/11/13- 45 yoF former smoker referred courtesy of Dr Dorann Lodge PFT Son "[name]" is here. She had been smoking less than one pack per day when she quit in 2009. She has been noticing shortness of breath with light to moderate activity, relieved by rest. This seems to have been going on for months up to a year or more. Minimal wheeze and little change from one day to the next. Little cough, no phlegm. Her ankles may swell occasionally. History of pneumonia one year ago and had flu in October 2014. Pacemaker for atrial fibrillation with slow VR. [facility] PFT: 06/22/2013-moderate obstructive airways disease with diffusion severely reduced.  FVC 2.08/87%, FEV1 1.17/66%, FEV1/FVC 0.56, FEF 25-75% 0.58/74%. Slight response to bronchodilator. Diffusion 41% of predicted. CXR 01/31/13 Findings: A left dual lead cardiac pacemaker has been placed. The  leads are in the region of the right ventricle and right atrium.  Negative for a pneumothorax. Lungs are clear without airspace  disease or pulmonary edema. Retrocardiac density is suggestive for  a small hiatal hernia. There are atherosclerotic calcifications at  the aortic arch. Heart size is normal.  IMPRESSION:  Placement of left chest cardiac pacemaker. Negative for  pneumothorax.  No acute chest findings.  Original Report Authenticated By: Richarda Overlie, M.D.  09/22/13- 82 yoF former smoker followed for dyspnea, COPD, dCHF, AFib/ pacemaker FOLLOWS FOR: Continues to use Anoro daily and can tell a difference. Son states patient seems to stay SOB alot. On direct questioning, it is not clear they see any benefit from Anoro. No acute issues. Breathing comfortably at night. Stable dyspnea on exertion with little cough or wheeze.  01/26/14- 29 yoF former smoker followed for COPD/emphysema, complicated by AF/pacemaker, dCHF FOLLOWS FOR: SOB with activity; slight wheezing as well.  05/04/14- 79 yoF former smoker followed for  COPD/emphysema, complicated by AF/pacemaker, dCHF          son here FOLLOWS FOR: Has been having episodes where she gets very weak and has to lie down-unsure if related to breathing or blood levels. Using nebulizer twice daily-some help. Not much cough. Continues to sleep with O2 2L "some" Had CXR during workup for flank pain-turned out to be UTI PFT 01/24/14- mild obstructive airways disease, severe diffusion deficit. 38%  CXR 03/24/14- FINDINGS: No fracture or other bone lesions are seen involving the ribs. There is no evidence of pneumothorax or pleural effusion. Both lungs are clear. Heart size and mediastinal contours are stable. Cardiac pacemaker is unchanged. Heart size is enlarged. The aorta is Tortuous. IMPRESSION: No acute fracture or dislocation of left ribs. Electronically Signed  By: Sherian Rein M.D.  On: 03/24/2014 16:14  01/15/15- 87 yoF former smoker followed for COPD/emphysema, complicated by AF/pacemaker, dCHF          son here FOLLOWS FOR: Has good and bad days-when bad days-has SOB and wheezing-has equal amount of good vs bad days. . Continues oxygen 2 L for sleep/APS and sleeps better with it. Still notices dyspnea on exertion but admits she has felt "depressed" and not going out of the house very much, especially in hot weather. Not much cough or wheeze. We again reviewed PFT showing reduced diffusion capacity and 2015.  Resting oxygen saturation 92% on room air, dropped to 89% after walking 185 feet on room air, then to 86% with next lab, pulse 87. Saturation rebounded with supplemental oxygen at 2 L.  ROS-see HPI Constitutional:   No-   weight loss, night sweats, fevers, chills, fatigue,  lassitude. HEENT:   No-  headaches, difficulty swallowing, tooth/dental problems, sore throat,       No-  sneezing, itching, ear ache, nasal congestion, post nasal drip,  CV:  No-   chest pain, orthopnea, PND, swelling in lower extremities, anasarca,                                                         dizziness, palpitations Resp: + shortness of breath with exertion or at rest.              No-   productive cough,  No non-productive cough,  No- coughing up of blood.              No-   change in color of mucus.  No- wheezing.   Skin: No-   rash or lesions. GI:  No-   heartburn, indigestion, abdominal pain, nausea, vomiting,  GU:  MS:  No-   joint pain or swelling.  . Neuro-     nothing unusual Psych:  No- change in mood or affect. No depression or anxiety.  No memory loss.  OBJ- Physical Exam General-                 Oriented, but looks to Son for answers to almost every question, Affect-appropriate, passive                 Distress-  none acute,  Skin- rash-none, lesions- none, excoriation- none Lymphadenopathy- none Head- atraumatic            Eyes- Gross vision intact, PERRLA, conjunctivae and secretions clear            Ears- Hearing, canals-normal            Nose- Clear, no-Septal dev, mucus, polyps, erosion, perforation             Throat- Mallampati II , mucosa clear , drainage- none, tonsils- atrophic Neck- flexible , trachea midline, no stridor , thyroid nl, carotid no bruit Chest - symmetrical excursion , unlabored           Heart/CV- RRR , no murmur , no gallop  , no rub, nl s1 s2                           - JVD- none , edema- none, stasis changes- none, varices- none           Lung- clear to P&A, wheeze- none, cough- none , dullness-none, rub- none           Chest wall- +L pacemaker Abd-  Br/ Gen/ Rectal- Not done, not indicated Extrem- cyanosis- none, clubbing, none, atrophy- none, strength- nl Neuro- grossly intact to observation

## 2015-01-15 NOTE — Patient Instructions (Addendum)
We can continue O2 2L/ [facility]PS for sleep and if needed  We can continue present treatment  Order- walk test on room air, dx dyspnea on exertion-test concluded O2 for exertion-order sent to [facility]  Please call as needed

## 2015-01-16 ENCOUNTER — Telehealth: Payer: Self-pay | Admitting: Internal Medicine

## 2015-01-16 ENCOUNTER — Encounter: Payer: Self-pay | Admitting: Gastroenterology

## 2015-01-16 NOTE — Assessment & Plan Note (Signed)
Emphysema without acute bronchitic exacerbation at this visit. Secondary chronic hypoxic respiratory failure discussed separately.

## 2015-01-16 NOTE — Assessment & Plan Note (Signed)
Ventricular response rate is well-controlled on today's exam. Management by cardiology.

## 2015-01-16 NOTE — Telephone Encounter (Signed)
Notes and sats have been faxed to [facility] by [facility]. Nothing more needed at this time.

## 2015-01-16 NOTE — Telephone Encounter (Signed)
Dr Maple Hudson is aware that completed OV notes are needed as well as O2 sats. Will fax over as soon as he has completed the note.

## 2015-01-16 NOTE — Assessment & Plan Note (Signed)
She needs an qualifies for addition of portable oxygen for exertional desaturation, to her existing oxygen for sleep. Plan-DME [facility] contacted to add portable oxygen 2 L. Education done

## 2015-01-18 ENCOUNTER — Encounter: Payer: Medicare Other | Admitting: *Deleted

## 2015-01-18 ENCOUNTER — Telehealth: Payer: Self-pay | Admitting: Cardiology

## 2015-01-18 ENCOUNTER — Ambulatory Visit (INDEPENDENT_AMBULATORY_CARE_PROVIDER_SITE_OTHER): Payer: Medicare Other | Admitting: Ophthalmology

## 2015-01-18 NOTE — Telephone Encounter (Signed)
Spoke with pt and reminded pt of remote transmission that is due today. Pt verbalized understanding.   

## 2015-01-22 ENCOUNTER — Encounter: Payer: Self-pay | Admitting: Cardiology

## 2015-01-26 ENCOUNTER — Ambulatory Visit (INDEPENDENT_AMBULATORY_CARE_PROVIDER_SITE_OTHER): Payer: Medicare Other | Admitting: *Deleted

## 2015-01-26 DIAGNOSIS — I495 Sick sinus syndrome: Secondary | ICD-10-CM

## 2015-01-27 DIAGNOSIS — I495 Sick sinus syndrome: Secondary | ICD-10-CM

## 2015-02-01 ENCOUNTER — Ambulatory Visit (INDEPENDENT_AMBULATORY_CARE_PROVIDER_SITE_OTHER): Payer: Medicare Other | Admitting: Ophthalmology

## 2015-02-01 DIAGNOSIS — H3553 Other dystrophies primarily involving the sensory retina: Secondary | ICD-10-CM

## 2015-02-01 DIAGNOSIS — H3531 Nonexudative age-related macular degeneration: Secondary | ICD-10-CM

## 2015-02-01 DIAGNOSIS — I10 Essential (primary) hypertension: Secondary | ICD-10-CM | POA: Diagnosis not present

## 2015-02-01 DIAGNOSIS — H43813 Vitreous degeneration, bilateral: Secondary | ICD-10-CM | POA: Diagnosis not present

## 2015-02-01 DIAGNOSIS — H35033 Hypertensive retinopathy, bilateral: Secondary | ICD-10-CM

## 2015-02-01 NOTE — Progress Notes (Signed)
Remote pacemaker transmission.   

## 2015-02-07 ENCOUNTER — Other Ambulatory Visit: Payer: Self-pay | Admitting: Cardiovascular Disease

## 2015-02-07 NOTE — Telephone Encounter (Signed)
Rx request sent to pharmacy.  

## 2015-02-08 LAB — CUP PACEART REMOTE DEVICE CHECK
Battery Impedance: 111 Ohm
Battery Remaining Longevity: 157 mo
Battery Voltage: 2.79 V
Brady Statistic AP VP Percent: 0 %
Brady Statistic AS VS Percent: 3 %
Date Time Interrogation Session: 20160827122428
Lead Channel Impedance Value: 481 Ohm
Lead Channel Impedance Value: 511 Ohm
Lead Channel Pacing Threshold Amplitude: 0.5 V
Lead Channel Pacing Threshold Pulse Width: 0.4 ms
Lead Channel Sensing Intrinsic Amplitude: 5.6 mV
Lead Channel Setting Pacing Amplitude: 1.5 V
Lead Channel Setting Pacing Amplitude: 2.25 V
Lead Channel Setting Pacing Pulse Width: 0.4 ms
Lead Channel Setting Sensing Sensitivity: 2 mV
MDC IDC MSMT LEADCHNL RA PACING THRESHOLD PULSEWIDTH: 0.4 ms
MDC IDC MSMT LEADCHNL RV PACING THRESHOLD AMPLITUDE: 1.125 V
MDC IDC STAT BRADY AP VS PERCENT: 96 %
MDC IDC STAT BRADY AS VP PERCENT: 0 %

## 2015-02-16 ENCOUNTER — Encounter: Payer: Self-pay | Admitting: Cardiology

## 2015-02-23 ENCOUNTER — Encounter: Payer: Self-pay | Admitting: Cardiovascular Disease

## 2015-03-13 ENCOUNTER — Telehealth: Payer: Self-pay | Admitting: Cardiovascular Disease

## 2015-03-13 NOTE — Telephone Encounter (Signed)
Larita Fife called in stating that Dr. Jeannetta Nap noticed that the pt was at risk with a drug interaction with her Coumadin and Amiodarone. He is wanting the pt to f/u in the office to change these meds. Please f/u with him  Thanks

## 2015-03-13 NOTE — Telephone Encounter (Signed)
[name] with Dr. Jeannetta Nap office - PCP - received a notification from [facility] with warning that patient was on amiodarone and warfarin and they did not know who had taken over patient's cardiology care since Dr. Alanda Amass left. Informed her that Dr. Royann Shivers is her new cardiologist and he is aware that she is on both medications. Informed her that patient's can be on both and that INR can be affected with changes in amiodarone dose, which have not occurred recently. She will fax info over for patient's file.

## 2015-03-22 ENCOUNTER — Ambulatory Visit (INDEPENDENT_AMBULATORY_CARE_PROVIDER_SITE_OTHER): Payer: Medicare Other | Admitting: Cardiovascular Disease

## 2015-03-22 ENCOUNTER — Encounter: Payer: Self-pay | Admitting: Cardiovascular Disease

## 2015-03-22 VITALS — BP 142/84 | HR 83 | Ht 61.0 in | Wt 186.0 lb

## 2015-03-22 DIAGNOSIS — R9431 Abnormal electrocardiogram [ECG] [EKG]: Secondary | ICD-10-CM

## 2015-03-22 DIAGNOSIS — I4581 Long QT syndrome: Secondary | ICD-10-CM | POA: Diagnosis not present

## 2015-03-22 DIAGNOSIS — Z950 Presence of cardiac pacemaker: Secondary | ICD-10-CM | POA: Diagnosis not present

## 2015-03-22 DIAGNOSIS — Z79899 Other long term (current) drug therapy: Secondary | ICD-10-CM

## 2015-03-22 DIAGNOSIS — I480 Paroxysmal atrial fibrillation: Secondary | ICD-10-CM | POA: Diagnosis not present

## 2015-03-22 DIAGNOSIS — I495 Sick sinus syndrome: Secondary | ICD-10-CM

## 2015-03-22 NOTE — Progress Notes (Signed)
Patient ID: [name], female   DOB: [birth year], 81 y.o.   MRN: [id number]     Cardiology Office Note   Date:  03/24/2015   ID:  [name], DOB 21-May-1932, MRN [id number]  PCP:  [name], MD  Cardiologist:   Thurmon Fair, MD   Chief Complaint  Patient presents with  . Annual Exam  . Shortness of Breath    COPD      History of Present Illness: [name] is a 81 y.o. female who presents for  Ablation and pacemaker follow-up.  In 2014, [name] underwent implantation of a dual-chamber permanent pacemaker for symptomatic atrial fibrillation with slow ventricular response and sinus node dysfunction. She does have a history of a previous stroke in October of 2013 and has undergone cardioversion at least twice for atrial fibrillation in the past. Her major comorbidity is COPD, possibly pulmonary fibrosis. She has a normal left ventricle systolic function, no history of coronary disease and only a mildly dilated left atrium. This suggests that her atrial fibrillation is very likely related to her pulmonary problems   There have been no major changes in [name] is health. She denies any cardiovascular complaints at this time.  She recently underwent a remote pacemaker download on August 27 that showed 96% atrial pacing and only 3% ventricular pacing. 181 episodes of mode switch had been recorded, most of them atrial fibrillation, but the longest one was only 29 minutes in duration. The overall burden of atrial fibrillation was very low. There were no episodes of high ventricular rate. Pacemaker lead parameters are excellent and generates a longevity is estimated at 11-15 years.   she has very long AV delay at around 360 ms , using MVP programming on her Medtronic device.    Past Medical History  Diagnosis Date  . Irregular heart beat   . Hypertension   . Hypercholesteremia   . Depression   . Atrial fibrillation (HCC)   . Bowel obstruction (HCC)   .  S/P cardiac pacemaker procedure, 02/03/13, Medtronic pacer 02/04/2013    sick sinus syndrome  . CVA (cerebral infarction)   . COPD (chronic obstructive pulmonary disease) (HCC)   . Tobacco abuse     quit 2009  . History of echocardiogram 05/21/2012    TEE; EF 55-60%; mild grade 1 atherosclerosis of prox descending aorta & distal aortic arch  . History of nuclear stress test 11/07/2009    dipyridamole; normal pattern of perfusion; negative for ischemia; low risk   . Carotid artery disease (HCC)     carotid duplex 10/2012 - bilat ICAs normla patency with mod tortuosity of vessel, L vertebral artery demonstrated occlsuve disease    Past Surgical History  Procedure Laterality Date  . Tee without cardioversion  05/21/2012    Procedure: TRANSESOPHAGEAL ECHOCARDIOGRAM (TEE);  Surgeon: Chrystie Nose, MD;  Location: [facility] ENDOSCOPY;  Service: Cardiovascular;  Laterality: N/A;  . Cardioversion  05/21/2012    Procedure: CARDIOVERSION;  Surgeon: Chrystie Nose, MD;  Location: [facility] ENDOSCOPY;  Service: Cardiovascular;  Laterality: N/A;  . Abdominal surgery    . Tonsillectomy    . Appendectomy    . Abdominal hysterectomy    . Colporrhaphy      posterior  . Pacemaker insertion  07/07/2012    medtronic  . Permanent pacemaker insertion Left 02/03/2013    Procedure: PERMANENT PACEMAKER INSERTION;  Surgeon: Thurmon Fair, MD;  Location: [facility] CATH LAB;  Service: Cardiovascular;  Laterality: Left;  Current Outpatient Prescriptions  Medication Sig Dispense Refill  . acetaminophen (TYLENOL) 500 MG tablet Take 500 mg by mouth at bedtime as needed for moderate pain. For pain to help sleep    . amiodarone (PACERONE) 200 MG tablet Take 200 mg by mouth daily.    . beta carotene w/minerals (OCUVITE) tablet Take 1 tablet by mouth daily.    . calcium-vitamin D (OSCAL WITH D) 500-200 MG-UNIT per tablet Take 1 tablet by mouth daily with breakfast.     . FLUoxetine (PROZAC) 20 MG capsule Take 20 mg by mouth daily.    .  furosemide (LASIX) 40 MG tablet TAKE 1 AND 1/2 TABLET BY MOUTH ONCE DAILY 135 tablet 0  . ipratropium-albuterol (DUONEB) 0.5-2.5 (3) MG/3ML SOLN Take 3 mLs by nebulization every 6 (six) hours as needed. 150 mL prn  . losartan (COZAAR) 50 MG tablet Take 50 mg by mouth daily.    . mirabegron ER (MYRBETRIQ) 50 MG TB24 tablet Take 50 mg by mouth daily.    . mirtazapine (REMERON) 15 MG tablet Take 15 mg by mouth at bedtime.    Marland Kitchen warfarin (COUMADIN) 5 MG tablet Take 2.5 mg by mouth daily at 6 PM.      No current facility-administered medications for this visit.    Allergies:   Amoxicillin; Doxycycline; Penicillins; and Sulfa antibiotics    Social History:  The patient  reports that she quit smoking about 7 years ago. Her smoking use included Cigarettes. She started smoking about 45 years ago. She has a 38 pack-year smoking history. She has never used smokeless tobacco. She reports that she does not drink alcohol or use illicit drugs.   Family History:  The patient's family history includes Coronary artery disease in her father; Heart attack in her brother; Stomach cancer in her paternal grandmother.    ROS:  Please see the history of present illness.    Otherwise, review of systems positive for none.   All other systems are reviewed and negative.    PHYSICAL EXAM: VS:  BP 142/84 mmHg  Pulse 83  Ht  (1.549 m)  Wt 186 lb (84.369 kg)  BMI 35.16 kg/m2 , BMI Body mass index is 35.16 kg/(m^2).  General: Alert, oriented x3, no distress Head: no evidence of trauma, PERRL, EOMI, no exophtalmos or lid lag, no myxedema, no xanthelasma; normal ears, nose and oropharynx Neck: normal jugular venous pulsations and no hepatojugular reflux; brisk carotid pulses without delay and no carotid bruits Chest: clear to auscultation, no signs of consolidation by percussion or palpation, normal fremitus, symmetrical and full respiratory excursions Cardiovascular: normal position and quality of the apical  impulse, regular rhythm, normal first and second heart sounds, no  murmurs, rubs or gallops Abdomen: no tenderness or distention, no masses by palpation, no abnormal pulsatility or arterial bruits, normal bowel sounds, no hepatosplenomegaly Extremities: no clubbing, cyanosis or edema; 2+ radial, ulnar and brachial pulses bilaterally; 2+ right femoral, posterior tibial and dorsalis pedis pulses; 2+ left femoral, posterior tibial and dorsalis pedis pulses; no subclavian or femoral bruits Neurological: grossly nonfocal Psych: euthymic mood, full affect   EKG:  EKG is ordered today. The ekg ordered today demonstrates  Atrial paced ventricular sensed rhythm with a very long AV delay of 358 ms, left axis deviation, incomplete right bundle branch block, pulmonary disease pattern. The QRS measures 114 ms, QTC 460 ms   Recent Labs: 03/22/2015: ALT 56*; BUN 18; Creat 1.03*; Potassium 4.1; Sodium 143; TSH 2.264  Lipid Panel No results found for: CHOL, TRIG, HDL, CHOLHDL, VLDL, LDLCALC, LDLDIRECT    Wt Readings from Last 3 Encounters:  03/22/15 186 lb (84.369 kg)  01/15/15 182 lb 6.4 oz (82.736 kg)  05/04/14 178 lb 3.2 oz (80.831 kg)      ASSESSMENT AND PLAN:  Chronic diastolic congestive heart failure  please clinically, she appears to be euvolemic at this time. There is concern that amiodarone could cause pulmonary fibrosis, but her lung problems appear to have started before treatment with this drug  PAF (paroxysmal atrial fibrillation) Her burden of atrial fibrillation is very low and she has good rate control. Her shortness of breath is not secondary to her arrhythmia. She has had substantial benefit from treatment with the amiodarone in reducing the arrhythmia prevalence. However with her concurrent lung problems this is a potentially risky drug. It should be used in the lowest dose possible. Recheck LFTs and TSH periodically.  Pulmonary fibrosis and moderate COPD Followed by Dr.  Maple HudsonYoung. On chronic O2 2L/min.    Current medicines are reviewed at length with the patient today.  The patient does not have concerns regarding medicines.  The following changes have been made:  no change  Labs/ tests ordered today include:   Orders Placed This Encounter  Procedures  . Comprehensive metabolic panel  . TSH  . EKG 12-Lead    Patient Instructions  Your physician recommends that you return for lab work in: TODAY AT [facility]  Remote monitoring is used to monitor your Pacemaker or ICD from home. This monitoring reduces the number of office visits required to check your device to one time per year. It allows us to monitor the functioning of your device to ensure it is working properly. You are scheduled for a device check from home on June 23, 2015. You may send your transmission at any time that day. If you have a wireless device, the transmission will be sent automatically. After your physician reviews your transmission, you will receive a postcard with your next transmission date.  Dr. Royann Shiversroitoru recommends that you schedule a follow-up appointment in: ONE YEAR.          Regina BimlerSigned, Cordella Nyquist, MD  03/24/2015 8:10 PM    Thurmon FairMihai Avannah Decker, MD, [facility] [phone] office [phone] pager

## 2015-03-22 NOTE — Patient Instructions (Signed)
Your physician recommends that you return for lab work in: TODAY AT [facility]  Remote monitoring is used to monitor your Pacemaker or ICD from home. This monitoring reduces the number of office visits required to check your device to one time per year. It allows us to monitor the functioning of your device to ensure it is working properly. You are scheduled for a device check from home on June 23, 2015. You may send your transmission at any time that day. If you have a wireless device, the transmission will be sent automatically. After your physician reviews your transmission, you will receive a postcard with your next transmission date.  Dr. Royann Shiversroitoru recommends that you schedule a follow-up appointment in: ONE YEAR.

## 2015-03-23 ENCOUNTER — Telehealth: Payer: Self-pay | Admitting: *Deleted

## 2015-03-23 DIAGNOSIS — R7989 Other specified abnormal findings of blood chemistry: Secondary | ICD-10-CM

## 2015-03-23 DIAGNOSIS — R945 Abnormal results of liver function studies: Principal | ICD-10-CM

## 2015-03-23 LAB — COMPREHENSIVE METABOLIC PANEL
ALK PHOS: 59 U/L (ref 33–130)
ALT: 56 U/L — AB (ref 6–29)
AST: 39 U/L — AB (ref 10–35)
Albumin: 4.2 g/dL (ref 3.6–5.1)
BILIRUBIN TOTAL: 0.5 mg/dL (ref 0.2–1.2)
BUN: 18 mg/dL (ref 7–25)
CO2: 28 mmol/L (ref 20–31)
Calcium: 9.1 mg/dL (ref 8.6–10.4)
Chloride: 101 mmol/L (ref 98–110)
Creat: 1.03 mg/dL — ABNORMAL HIGH (ref 0.60–0.88)
GLUCOSE: 88 mg/dL (ref 65–99)
POTASSIUM: 4.1 mmol/L (ref 3.5–5.3)
Sodium: 143 mmol/L (ref 135–146)
TOTAL PROTEIN: 7.1 g/dL (ref 6.1–8.1)

## 2015-03-23 LAB — TSH: TSH: 2.264 u[IU]/mL (ref 0.350–4.500)

## 2015-03-23 NOTE — Telephone Encounter (Signed)
Spoke with pt, aware of lab results.  Orders placed and mailed to the pt to have repeated.

## 2015-03-23 NOTE — Telephone Encounter (Signed)
-----   Message from Thurmon FairMihai Croitoru, MD sent at 03/23/2015  8:24 AM EDT ----- Labs are normal except very slight increase in liver tests, repeat LFTs in one month please

## 2015-04-12 ENCOUNTER — Other Ambulatory Visit: Payer: Self-pay | Admitting: Family Medicine

## 2015-04-12 ENCOUNTER — Encounter (INDEPENDENT_AMBULATORY_CARE_PROVIDER_SITE_OTHER): Payer: Medicare Other | Admitting: Ophthalmology

## 2015-04-12 ENCOUNTER — Ambulatory Visit
Admission: RE | Admit: 2015-04-12 | Discharge: 2015-04-12 | Disposition: A | Discharge status: Home / Self Care | Payer: Medicare Other | Source: Ambulatory Visit | Attending: Family Medicine | Admitting: Family Medicine

## 2015-04-12 DIAGNOSIS — H3553 Other dystrophies primarily involving the sensory retina: Secondary | ICD-10-CM

## 2015-04-12 DIAGNOSIS — H353124 Nonexudative age-related macular degeneration, left eye, advanced atrophic with subfoveal involvement: Secondary | ICD-10-CM | POA: Diagnosis not present

## 2015-04-12 DIAGNOSIS — M4850XA Collapsed vertebra, not elsewhere classified, site unspecified, initial encounter for fracture: Principal | ICD-10-CM

## 2015-04-12 DIAGNOSIS — IMO0001 Reserved for inherently not codable concepts without codable children: Secondary | ICD-10-CM

## 2015-04-12 DIAGNOSIS — H43813 Vitreous degeneration, bilateral: Secondary | ICD-10-CM | POA: Diagnosis not present

## 2015-04-12 DIAGNOSIS — H35033 Hypertensive retinopathy, bilateral: Secondary | ICD-10-CM

## 2015-04-12 DIAGNOSIS — I10 Essential (primary) hypertension: Secondary | ICD-10-CM | POA: Diagnosis not present

## 2015-04-12 DIAGNOSIS — H353211 Exudative age-related macular degeneration, right eye, with active choroidal neovascularization: Secondary | ICD-10-CM

## 2015-04-12 DIAGNOSIS — H318 Other specified disorders of choroid: Secondary | ICD-10-CM | POA: Diagnosis not present

## 2015-04-21 ENCOUNTER — Emergency Department (HOSPITAL_COMMUNITY): Payer: Medicare Other

## 2015-04-21 ENCOUNTER — Inpatient Hospital Stay (HOSPITAL_COMMUNITY)
Admission: EM | Admit: 2015-04-21 | Discharge: 2015-05-02 | DRG: 542 | Disposition: A | Discharge status: Hospice | Payer: Medicare Other | Attending: Internal Medicine | Admitting: Internal Medicine

## 2015-04-21 ENCOUNTER — Encounter (HOSPITAL_COMMUNITY): Payer: Self-pay

## 2015-04-21 DIAGNOSIS — Z881 Allergy status to other antibiotic agents status: Secondary | ICD-10-CM

## 2015-04-21 DIAGNOSIS — I480 Paroxysmal atrial fibrillation: Secondary | ICD-10-CM | POA: Diagnosis present

## 2015-04-21 DIAGNOSIS — R079 Chest pain, unspecified: Secondary | ICD-10-CM | POA: Diagnosis not present

## 2015-04-21 DIAGNOSIS — M4854XA Collapsed vertebra, not elsewhere classified, thoracic region, initial encounter for fracture: Secondary | ICD-10-CM | POA: Diagnosis not present

## 2015-04-21 DIAGNOSIS — F329 Major depressive disorder, single episode, unspecified: Secondary | ICD-10-CM | POA: Diagnosis present

## 2015-04-21 DIAGNOSIS — Z6831 Body mass index (BMI) 31.0-31.9, adult: Secondary | ICD-10-CM

## 2015-04-21 DIAGNOSIS — J441 Chronic obstructive pulmonary disease with (acute) exacerbation: Secondary | ICD-10-CM | POA: Clinically undetermined

## 2015-04-21 DIAGNOSIS — M4850XA Collapsed vertebra, not elsewhere classified, site unspecified, initial encounter for fracture: Secondary | ICD-10-CM | POA: Diagnosis present

## 2015-04-21 DIAGNOSIS — I5031 Acute diastolic (congestive) heart failure: Secondary | ICD-10-CM | POA: Diagnosis present

## 2015-04-21 DIAGNOSIS — E872 Acidosis: Secondary | ICD-10-CM | POA: Diagnosis present

## 2015-04-21 DIAGNOSIS — F419 Anxiety disorder, unspecified: Secondary | ICD-10-CM | POA: Diagnosis present

## 2015-04-21 DIAGNOSIS — IMO0001 Reserved for inherently not codable concepts without codable children: Secondary | ICD-10-CM

## 2015-04-21 DIAGNOSIS — R0603 Acute respiratory distress: Secondary | ICD-10-CM | POA: Insufficient documentation

## 2015-04-21 DIAGNOSIS — Z87891 Personal history of nicotine dependence: Secondary | ICD-10-CM

## 2015-04-21 DIAGNOSIS — F05 Delirium due to known physiological condition: Secondary | ICD-10-CM | POA: Diagnosis not present

## 2015-04-21 DIAGNOSIS — I5033 Acute on chronic diastolic (congestive) heart failure: Secondary | ICD-10-CM | POA: Diagnosis present

## 2015-04-21 DIAGNOSIS — W19XXXA Unspecified fall, initial encounter: Secondary | ICD-10-CM | POA: Diagnosis present

## 2015-04-21 DIAGNOSIS — Z880 Allergy status to penicillin: Secondary | ICD-10-CM

## 2015-04-21 DIAGNOSIS — R0600 Dyspnea, unspecified: Secondary | ICD-10-CM | POA: Insufficient documentation

## 2015-04-21 DIAGNOSIS — Z7901 Long term (current) use of anticoagulants: Secondary | ICD-10-CM

## 2015-04-21 DIAGNOSIS — J9620 Acute and chronic respiratory failure, unspecified whether with hypoxia or hypercapnia: Secondary | ICD-10-CM | POA: Clinically undetermined

## 2015-04-21 DIAGNOSIS — IMO0002 Reserved for concepts with insufficient information to code with codable children: Secondary | ICD-10-CM

## 2015-04-21 DIAGNOSIS — E876 Hypokalemia: Secondary | ICD-10-CM | POA: Diagnosis present

## 2015-04-21 DIAGNOSIS — J432 Centrilobular emphysema: Secondary | ICD-10-CM

## 2015-04-21 DIAGNOSIS — J9621 Acute and chronic respiratory failure with hypoxia: Secondary | ICD-10-CM | POA: Diagnosis not present

## 2015-04-21 DIAGNOSIS — T148 Other injury of unspecified body region: Secondary | ICD-10-CM | POA: Diagnosis not present

## 2015-04-21 DIAGNOSIS — R0902 Hypoxemia: Secondary | ICD-10-CM | POA: Insufficient documentation

## 2015-04-21 DIAGNOSIS — Z515 Encounter for palliative care: Secondary | ICD-10-CM | POA: Insufficient documentation

## 2015-04-21 DIAGNOSIS — J439 Emphysema, unspecified: Secondary | ICD-10-CM | POA: Diagnosis present

## 2015-04-21 DIAGNOSIS — E669 Obesity, unspecified: Secondary | ICD-10-CM | POA: Diagnosis present

## 2015-04-21 DIAGNOSIS — E7800 Pure hypercholesterolemia, unspecified: Secondary | ICD-10-CM | POA: Diagnosis present

## 2015-04-21 DIAGNOSIS — I10 Essential (primary) hypertension: Secondary | ICD-10-CM | POA: Diagnosis present

## 2015-04-21 DIAGNOSIS — Z882 Allergy status to sulfonamides status: Secondary | ICD-10-CM

## 2015-04-21 DIAGNOSIS — Z7189 Other specified counseling: Secondary | ICD-10-CM | POA: Insufficient documentation

## 2015-04-21 DIAGNOSIS — J9622 Acute and chronic respiratory failure with hypercapnia: Secondary | ICD-10-CM | POA: Diagnosis not present

## 2015-04-21 DIAGNOSIS — J690 Pneumonitis due to inhalation of food and vomit: Secondary | ICD-10-CM | POA: Diagnosis present

## 2015-04-21 DIAGNOSIS — Z8673 Personal history of transient ischemic attack (TIA), and cerebral infarction without residual deficits: Secondary | ICD-10-CM

## 2015-04-21 DIAGNOSIS — E873 Alkalosis: Secondary | ICD-10-CM | POA: Diagnosis not present

## 2015-04-21 DIAGNOSIS — Z66 Do not resuscitate: Secondary | ICD-10-CM | POA: Diagnosis present

## 2015-04-21 DIAGNOSIS — Z950 Presence of cardiac pacemaker: Secondary | ICD-10-CM | POA: Diagnosis present

## 2015-04-21 LAB — CBC WITH DIFFERENTIAL/PLATELET
Basophils Absolute: 0 10*3/uL (ref 0.0–0.1)
Basophils Relative: 0 %
Eosinophils Absolute: 0 10*3/uL (ref 0.0–0.7)
Eosinophils Relative: 0 %
HEMATOCRIT: 39.4 % (ref 36.0–46.0)
HEMOGLOBIN: 12.4 g/dL (ref 12.0–15.0)
LYMPHS ABS: 1.2 10*3/uL (ref 0.7–4.0)
Lymphocytes Relative: 9 %
MCH: 26.6 pg (ref 26.0–34.0)
MCHC: 31.5 g/dL (ref 30.0–36.0)
MCV: 84.5 fL (ref 78.0–100.0)
MONO ABS: 1.4 10*3/uL — AB (ref 0.1–1.0)
MONOS PCT: 10 %
NEUTROS ABS: 10.4 10*3/uL — AB (ref 1.7–7.7)
NEUTROS PCT: 81 %
Platelets: 357 10*3/uL (ref 150–400)
RBC: 4.66 MIL/uL (ref 3.87–5.11)
RDW: 15.4 % (ref 11.5–15.5)
WBC: 13 10*3/uL — ABNORMAL HIGH (ref 4.0–10.5)

## 2015-04-21 LAB — URINALYSIS, ROUTINE W REFLEX MICROSCOPIC
BILIRUBIN URINE: NEGATIVE
GLUCOSE, UA: NEGATIVE mg/dL
KETONES UR: NEGATIVE mg/dL
Leukocytes, UA: NEGATIVE
Nitrite: NEGATIVE
PH: 6.5 (ref 5.0–8.0)
Protein, ur: NEGATIVE mg/dL
SPECIFIC GRAVITY, URINE: 1.016 (ref 1.005–1.030)

## 2015-04-21 LAB — URINE MICROSCOPIC-ADD ON

## 2015-04-21 LAB — BASIC METABOLIC PANEL
ANION GAP: 11 (ref 5–15)
BUN: 16 mg/dL (ref 6–20)
CALCIUM: 8.7 mg/dL — AB (ref 8.9–10.3)
CHLORIDE: 102 mmol/L (ref 101–111)
CO2: 29 mmol/L (ref 22–32)
CREATININE: 0.98 mg/dL (ref 0.44–1.00)
GFR calc non Af Amer: 53 mL/min — ABNORMAL LOW (ref >60)
GLUCOSE: 117 mg/dL — AB (ref 65–99)
Potassium: 2.8 mmol/L — ABNORMAL LOW (ref 3.5–5.1)
Sodium: 142 mmol/L (ref 135–145)

## 2015-04-21 LAB — I-STAT TROPONIN, ED: TROPONIN I, POC: 0.02 ng/mL (ref 0.00–0.08)

## 2015-04-21 LAB — PROTIME-INR
INR: 2.54 — AB (ref 0.00–1.49)
PROTHROMBIN TIME: 27 s — AB (ref 11.6–15.2)

## 2015-04-21 MED ORDER — IPRATROPIUM-ALBUTEROL 0.5-2.5 (3) MG/3ML IN SOLN: 3.0000 mL | RESPIRATORY_TRACT | Status: DC | PRN

## 2015-04-21 MED ORDER — MORPHINE SULFATE (PF) 4 MG/ML IV SOLN
4.0000 mg | Freq: Once | INTRAVENOUS | Status: AC
  Administered 2015-04-21: 4 mg via INTRAVENOUS
  Filled 2015-04-21: qty 1

## 2015-04-21 MED ORDER — LOSARTAN POTASSIUM 50 MG PO TABS
50.0000 mg | ORAL_TABLET | Freq: Every morning | ORAL | Status: DC
  Administered 2015-04-22 – 2015-05-01 (×9): 50 mg via ORAL
  Filled 2015-04-21 (×9): qty 1

## 2015-04-21 MED ORDER — ONDANSETRON HCL 4 MG PO TABS: 4.0000 mg | ORAL_TABLET | Freq: Four times a day (QID) | ORAL | Status: DC | PRN

## 2015-04-21 MED ORDER — MIRTAZAPINE 15 MG PO TABS
15.0000 mg | ORAL_TABLET | Freq: Every day | ORAL | Status: DC
  Administered 2015-04-21 – 2015-05-01 (×11): 15 mg via ORAL
  Filled 2015-04-21 (×11): qty 1

## 2015-04-21 MED ORDER — ONDANSETRON HCL 4 MG/2ML IJ SOLN: 4.0000 mg | Freq: Four times a day (QID) | INTRAMUSCULAR | Status: DC | PRN

## 2015-04-21 MED ORDER — MIRABEGRON ER 50 MG PO TB24
50.0000 mg | ORAL_TABLET | Freq: Every evening | ORAL | Status: DC
  Administered 2015-04-21 – 2015-04-28 (×8): 50 mg via ORAL
  Filled 2015-04-21 (×8): qty 1

## 2015-04-21 MED ORDER — CALCIUM CARBONATE-VITAMIN D 500-200 MG-UNIT PO TABS
1.0000 | ORAL_TABLET | Freq: Every day | ORAL | Status: DC
  Administered 2015-04-22 – 2015-04-26 (×5): 1 via ORAL
  Filled 2015-04-21 (×6): qty 1

## 2015-04-21 MED ORDER — SODIUM CHLORIDE 0.9 % IJ SOLN
3.0000 mL | Freq: Two times a day (BID) | INTRAMUSCULAR | Status: DC
  Administered 2015-04-21 – 2015-05-02 (×13): 3 mL via INTRAVENOUS

## 2015-04-21 MED ORDER — CALCITONIN (SALMON) 200 UNIT/ACT NA SOLN
1.0000 | Freq: Every evening | NASAL | Status: DC
  Administered 2015-04-21 – 2015-05-01 (×10): 1 via NASAL
  Filled 2015-04-21: qty 3.7

## 2015-04-21 MED ORDER — HYDROMORPHONE HCL 1 MG/ML IJ SOLN
0.5000 mg | INTRAMUSCULAR | Status: DC | PRN
  Administered 2015-04-21: 0.5 mg via INTRAVENOUS
  Filled 2015-04-21: qty 1

## 2015-04-21 MED ORDER — SODIUM CHLORIDE 0.9 % IJ SOLN
3.0000 mL | Freq: Two times a day (BID) | INTRAMUSCULAR | Status: DC
  Administered 2015-04-23 – 2015-04-30 (×4): 3 mL via INTRAVENOUS

## 2015-04-21 MED ORDER — CETYLPYRIDINIUM CHLORIDE 0.05 % MT LIQD
7.0000 mL | Freq: Two times a day (BID) | OROMUCOSAL | Status: DC
  Administered 2015-04-22 – 2015-05-01 (×14): 7 mL via OROMUCOSAL

## 2015-04-21 MED ORDER — FLUOXETINE HCL 20 MG PO CAPS
20.0000 mg | ORAL_CAPSULE | Freq: Every morning | ORAL | Status: DC
  Administered 2015-04-22 – 2015-05-02 (×10): 20 mg via ORAL
  Filled 2015-04-21 (×10): qty 1

## 2015-04-21 MED ORDER — ACETAMINOPHEN 650 MG RE SUPP: 650.0000 mg | Freq: Four times a day (QID) | RECTAL | Status: DC | PRN

## 2015-04-21 MED ORDER — FUROSEMIDE 20 MG PO TABS
20.0000 mg | ORAL_TABLET | Freq: Every day | ORAL | Status: DC
Start: 2015-04-22 — End: 2015-04-25
  Administered 2015-04-22 – 2015-04-25 (×4): 20 mg via ORAL
  Filled 2015-04-21 (×4): qty 1

## 2015-04-21 MED ORDER — OXYCODONE HCL 5 MG PO TABS
5.0000 mg | ORAL_TABLET | ORAL | Status: DC | PRN
  Administered 2015-04-21 – 2015-04-22 (×2): 5 mg via ORAL
  Administered 2015-04-22 – 2015-04-26 (×8): 10 mg via ORAL
  Administered 2015-04-27: 5 mg via ORAL
  Administered 2015-04-28 – 2015-04-29 (×6): 10 mg via ORAL
  Filled 2015-04-21 (×2): qty 2
  Filled 2015-04-21: qty 1
  Filled 2015-04-21: qty 2
  Filled 2015-04-21: qty 1
  Filled 2015-04-21: qty 2
  Filled 2015-04-21: qty 1
  Filled 2015-04-21 (×11): qty 2

## 2015-04-21 MED ORDER — SODIUM CHLORIDE 0.9 % IJ SOLN: 3.0000 mL | INTRAMUSCULAR | Status: DC | PRN

## 2015-04-21 MED ORDER — SODIUM CHLORIDE 0.9 % IV SOLN: 250.0000 mL | INTRAVENOUS | Status: DC | PRN

## 2015-04-21 MED ORDER — ONDANSETRON HCL 4 MG/2ML IJ SOLN
4.0000 mg | Freq: Once | INTRAMUSCULAR | Status: AC
  Administered 2015-04-21: 4 mg via INTRAVENOUS
  Filled 2015-04-21: qty 2

## 2015-04-21 MED ORDER — AMIODARONE HCL 200 MG PO TABS
200.0000 mg | ORAL_TABLET | Freq: Every morning | ORAL | Status: DC
  Administered 2015-04-22 – 2015-05-02 (×10): 200 mg via ORAL
  Filled 2015-04-21 (×10): qty 1

## 2015-04-21 MED ORDER — POTASSIUM CHLORIDE CRYS ER 20 MEQ PO TBCR
40.0000 meq | EXTENDED_RELEASE_TABLET | Freq: Once | ORAL | Status: AC
  Administered 2015-04-21: 40 meq via ORAL
  Filled 2015-04-21: qty 2

## 2015-04-21 MED ORDER — ACETAMINOPHEN 325 MG PO TABS
650.0000 mg | ORAL_TABLET | Freq: Four times a day (QID) | ORAL | Status: DC | PRN
  Filled 2015-04-21: qty 2

## 2015-04-21 NOTE — ED Provider Notes (Signed)
CSN: [id number]     Arrival date & time 04/21/15  1656 History   First MD Initiated Contact with Patient 04/21/15 1803     Chief Complaint  Patient presents with  . Back Pain   [name] is a 81 y.o. female with history of atrial fibrillation on Coumadin, pacemaker, COPD, and osteopenia who presents to the emergency department with her son who reports she has been complaining of back pain for the past 3 weeks. Patient was seen by her PCP and had a CT of her thoracic spine 4 days ago which indicated a T6 compression fracture with 40% vertebral height loss. Patient has been taking Norco intermittently for pain. The son reports the patient is been complaining more about the pain today and requested emergency department evaluation. The patient also is reporting some left-sided chest pain starting today. She is unable to describe this pain further. The patient's son reports she just began complaining of this pain today. The patient's son reports she does have some dementia. The patient denies fevers, shortness of breath, coughing, abdominal pain, nausea, vomiting, diarrhea, urinary symptoms, numbness, tingling or weakness.  (Consider location/radiation/quality/duration/timing/severity/associated sxs/prior Treatment) HPI  Past Medical History  Diagnosis Date  . Irregular heart beat   . Hypertension   . Hypercholesteremia   . Depression   . Atrial fibrillation (HCC)   . Bowel obstruction (HCC)   . S/P cardiac pacemaker procedure, 02/03/13, Medtronic pacer 02/04/2013    sick sinus syndrome  . CVA (cerebral infarction)   . COPD (chronic obstructive pulmonary disease) (HCC)   . Tobacco abuse     quit 2009  . History of echocardiogram 05/21/2012    TEE; EF 55-60%; mild grade 1 atherosclerosis of prox descending aorta & distal aortic arch  . History of nuclear stress test 11/07/2009    dipyridamole; normal pattern of perfusion; negative for ischemia; low risk   . Carotid artery disease (HCC)    carotid duplex 10/2012 - bilat ICAs normla patency with mod tortuosity of vessel, L vertebral artery demonstrated occlsuve disease   Past Surgical History  Procedure Laterality Date  . Tee without cardioversion  05/21/2012    Procedure: TRANSESOPHAGEAL ECHOCARDIOGRAM (TEE);  Surgeon: Chrystie Nose, MD;  Location: [facility] ENDOSCOPY;  Service: Cardiovascular;  Laterality: N/A;  . Cardioversion  05/21/2012    Procedure: CARDIOVERSION;  Surgeon: Chrystie Nose, MD;  Location: [facility] ENDOSCOPY;  Service: Cardiovascular;  Laterality: N/A;  . Abdominal surgery    . Tonsillectomy    . Appendectomy    . Abdominal hysterectomy    . Colporrhaphy      posterior  . Pacemaker insertion  07/07/2012    medtronic  . Permanent pacemaker insertion Left 02/03/2013    Procedure: PERMANENT PACEMAKER INSERTION;  Surgeon: Thurmon Fair, MD;  Location: [facility] CATH LAB;  Service: Cardiovascular;  Laterality: Left;   Family History  Problem Relation Age of Onset  . Coronary artery disease Father   . Heart attack Brother   . Stomach cancer Paternal Grandmother    Social History  Substance Use Topics  . Smoking status: Former Smoker -- 1.00 packs/day for 38 years    Types: Cigarettes    Start date: 06/02/1969    Quit date: 06/03/2007  . Smokeless tobacco: Never Used  . Alcohol Use: No   OB History    No data available     Review of Systems  Constitutional: Negative for fever and chills.  HENT: Negative for congestion and sore throat.  Eyes: Negative for visual disturbance.  Respiratory: Negative for cough and shortness of breath.   Cardiovascular: Positive for chest pain. Negative for palpitations.  Gastrointestinal: Negative for nausea, vomiting, abdominal pain and diarrhea.  Genitourinary: Negative for dysuria, urgency, frequency, hematuria and difficulty urinating.  Musculoskeletal: Positive for back pain. Negative for neck pain and neck stiffness.  Skin: Negative for rash.  Neurological: Negative for  weakness, numbness and headaches.      Allergies  Amoxicillin; Doxycycline; Penicillins; and Sulfa antibiotics  Home Medications   Prior to Admission medications   Medication Sig Start Date End Date Taking? Authorizing Provider  acetaminophen (TYLENOL) 500 MG tablet Take 500 mg by mouth at bedtime as needed for moderate pain. For pain to help sleep   Yes Historical Provider, MD  amiodarone (PACERONE) 200 MG tablet Take 200 mg by mouth every morning.    Yes Historical Provider, MD  beta carotene w/minerals (OCUVITE) tablet Take 1 tablet by mouth every morning.    Yes Historical Provider, MD  calcitonin, salmon, (MIACALCIN/FORTICAL) 200 UNIT/ACT nasal spray Place 1 spray into alternate nostrils every evening.   Yes Historical Provider, MD  calcium-vitamin D (OSCAL WITH D) 500-200 MG-UNIT per tablet Take 1 tablet by mouth daily with breakfast.    Yes Historical Provider, MD  FLUoxetine (PROZAC) 20 MG capsule Take 20 mg by mouth every morning.    Yes Historical Provider, MD  furosemide (LASIX) 40 MG tablet TAKE 1 AND 1/2 TABLET BY MOUTH ONCE DAILY 02/07/15  Yes Mihai Croitoru, MD  HYDROcodone-acetaminophen (NORCO/VICODIN) 5-325 MG tablet Take 1 tablet by mouth every 8 (eight) hours.   Yes Historical Provider, MD  ipratropium-albuterol (DUONEB) 0.5-2.5 (3) MG/3ML SOLN Take 3 mLs by nebulization every 6 (six) hours as needed. 01/26/14  Yes Waymon Budgelinton D Young, MD  losartan (COZAAR) 50 MG tablet Take 50 mg by mouth every morning.    Yes Historical Provider, MD  mirabegron ER (MYRBETRIQ) 50 MG TB24 tablet Take 50 mg by mouth every evening.    Yes Historical Provider, MD  mirtazapine (REMERON) 15 MG tablet Take 15 mg by mouth at bedtime.   Yes Historical Provider, MD  warfarin (COUMADIN) 5 MG tablet Take 2.5 mg by mouth daily at 6 PM.    Yes Historical Provider, MD   BP 127/70 mmHg  Pulse 64  Temp(Src) 97.8 F (36.6 C) (Oral)  Resp 16  Ht 5\' 3"  (1.6 m)  Wt 186 lb (84.369 kg)  BMI 32.96 kg/m2  SpO2  96% Physical Exam  Constitutional: She is oriented to person, place, and time. She appears well-developed and well-nourished. No distress.  HENT:  Head: Normocephalic and atraumatic.  Mouth/Throat: Oropharynx is clear and moist.  Eyes: Conjunctivae are normal. Pupils are equal, round, and reactive to light. Right eye exhibits no discharge. Left eye exhibits no discharge.  Neck: Normal range of motion. Neck supple. No JVD present. No tracheal deviation present.  Cardiovascular: Normal rate, regular rhythm, normal heart sounds and intact distal pulses.  Exam reveals no gallop and no friction rub.   No murmur heard. Pulmonary/Chest: Effort normal and breath sounds normal. No respiratory distress. She has no wheezes. She has no rales. She exhibits no tenderness.  Lungs are clear to auscultation bilaterally. No chest tenderness to palpation.  Abdominal: Soft. Bowel sounds are normal. She exhibits no distension. There is no tenderness. There is no guarding.  Musculoskeletal: She exhibits tenderness. She exhibits no edema.  Patient has tenderness to her midline thoracic spine. No crepitus.  Lymphadenopathy:    She has no cervical adenopathy.  Neurological: She is alert and oriented to person, place, and time. Coordination normal.  The patient is alert and oriented 3 however the patient is slow to respond to some questions and is unable to provide details to specific events leading up to today's visit. Sensation is intact to bilateral upper and lower extremities. Patient is spontaneously moving all extremities in a coordinated fashion exhibiting good strength.   Skin: Skin is warm and dry. No rash noted. She is not diaphoretic. No erythema. No pallor.  Psychiatric: She has a normal mood and affect. Her behavior is normal.  Nursing note and vitals reviewed.   ED Course  Procedures (including critical care time) Labs Review Labs Reviewed  PROTIME-INR - Abnormal; Notable for the following:     Prothrombin Time 27.0 (*)    INR 2.54 (*)    All other components within normal limits  BASIC METABOLIC PANEL - Abnormal; Notable for the following:    Potassium 2.8 (*)    Glucose, Bld 117 (*)    Calcium 8.7 (*)    GFR calc non Af Amer 53 (*)    All other components within normal limits  CBC WITH DIFFERENTIAL/PLATELET - Abnormal; Notable for the following:    WBC 13.0 (*)    Neutro Abs 10.4 (*)    Monocytes Absolute 1.4 (*)    All other components within normal limits  URINALYSIS, ROUTINE W REFLEX MICROSCOPIC (NOT AT [facility]) - Abnormal; Notable for the following:    APPearance CLOUDY (*)    Hgb urine dipstick SMALL (*)    All other components within normal limits  URINE MICROSCOPIC-ADD ON - Abnormal; Notable for the following:    Squamous Epithelial / LPF 0-5 (*)    Bacteria, UA MANY (*)    All other components within normal limits  I-STAT TROPOININ, ED    Imaging Review Dg Chest 2 View  04/21/2015  CLINICAL DATA:  Left chest and back pain EXAM: CHEST  2 VIEW COMPARISON:  03/24/2014 chest radiograph. FINDINGS: Two-vessel left subclavian pacemaker is stable with lead tips overlying the right atrium and right ventricle. Stable cardiomediastinal silhouette with mild cardiomegaly. No pneumothorax. No pleural effusion. Cephalization of the pulmonary vasculature, without overt pulmonary edema. No focal lung consolidation. IMPRESSION: Stable mild cardiomegaly without overt pulmonary edema. No active pulmonary disease. Electronically Signed   By: Delbert Phenix M.D.   On: 04/21/2015 19:19   I have personally reviewed and evaluated these images and lab results as part of my medical decision-making.   EKG Interpretation   Date/Time:  Saturday April 21 2015 18:42:42 EST Ventricular Rate:  69 PR Interval:  248 QRS Duration: 112 QT Interval:  448 QTC Calculation: 480 R Axis:   -60 Text Interpretation:  Sinus rhythm Prolonged PR interval LAD, consider  left anterior fascicular block  Abnormal R-wave progression, late  transition Confirmed by PICKERING  MD, NATHAN [phone]) on 04/21/2015  9:08:59 PM      Filed Vitals:   04/21/15 1709 04/21/15 2013 04/21/15 2123 04/21/15 2130  BP: 103/74 147/74 155/69 127/70  Pulse: 77 65 64   Temp: 97.8 F (36.6 C)     TempSrc: Oral     Resp: Height:  (1.6 m)     Weight: 186 lb (84.369 kg)     SpO2: 90% 98% 96%      MDM   Meds given in ED:  Medications  morphine  4 MG/ML injection 4 mg (4 mg Intravenous Given 04/21/15 1927)  ondansetron (ZOFRAN) injection 4 mg (4 mg Intravenous Given 04/21/15 1927)  potassium chloride SA (K-DUR,KLOR-CON) CR tablet 40 mEq (40 mEq Oral Given 04/21/15 2038)  morphine 4 MG/ML injection 4 mg (4 mg Intravenous Given 04/21/15 2044)  morphine 4 MG/ML injection 4 mg (4 mg Intravenous Given 04/21/15 2151)    New Prescriptions   No medications on file    Final diagnoses:  Vertebral compression fracture, initial encounter (HCC)  Chest pain, unspecified chest pain type   This  is a 81 y.o. female with history of atrial fibrillation on Coumadin, pacemaker, COPD, and osteopenia who presents to the emergency department with her son who reports she has been complaining of back pain for the past 3 weeks. Patient was seen by her PCP and had a CT of her thoracic spine 4 days ago which indicated a T6 compression fracture with 40% vertebral height loss. Patient has been taking Norco intermittently for pain. The son reports the patient is been complaining more about the pain today and requested emergency department evaluation. The patient also is reporting some left-sided chest pain starting today. On examination patient is afebrile nontoxic appearing. Patient does have thoracic midline back tenderness to palpation. Is consistent with her T6 compression fracture seen on CT scan from [facility]. The patient has no focal neurological deficits. Will obtain chest pain workup as well for this new chest  pain. I suspect this is caused from her compression fracture. Patient's troponin is 0.02. Urinalysis shows nitrite and leukocyte negative urine. INR is therapeutic at 2.54. BMP shows a potassium of 2.8. Her potassium was replaced with 40 mEq of by mouth potassium. CBC is remarkable only for a white count of 13,000. Chest x-ray is unremarkable. After 3 rounds of pain control patient is still complaining of 10 out of 10 pain. Will admit for pain control tonight. Patient and family are in agreement with plan for admission. The patient was accepted for admission by Dr. Beatrice Lecher who requests telemetry bed temporary admission orders.  This patient was discussed with Dr. Rubin Payor who agrees with assessment and plan.  Everlene Farrier, PA-C 04/21/15 2158  Benjiman Core, MD 04/21/15 2245

## 2015-04-21 NOTE — ED Notes (Signed)
Patient is in a lot of paint, PA aware, unable to move or do much with patient at this time.

## 2015-04-21 NOTE — ED Notes (Signed)
She denies any paresthesias of any extremity and is maintaining her position of comfort on her right side.  She tells me she has had back pain x 2 weeks.

## 2015-04-21 NOTE — H&P (Signed)
PCP:  [name], MD  Card[facility]  Referring provider Will PA   Chief Complaint:  Back pain and chest pain  HPI: [name] is a 81 y.o. female   has a past medical history of Irregular heart beat; Hypertension; Hypercholesteremia; Depression; Atrial fibrillation (HCC); Bowel obstruction (HCC); S/P cardiac pacemaker procedure, 02/03/13, Medtronic pacer (02/04/2013); CVA (cerebral infarction); COPD (chronic obstructive pulmonary disease) (HCC); Tobacco abuse; History of echocardiogram (05/21/2012); History of nuclear stress test (11/07/2009); and Carotid artery disease (HCC).   Presented with back pain for the past 3 weeks. Has been ongoing and has been progressively getting worse. Few days ago she presented to her primary care at [facility] who obtained this thoracic spine CT that showed T4 acute compression fracture with 40% body loss. Back pain is so severe that supported controlled with by mouth medications. Patient presented today to emergency department due to severity of pain. In emergency room she was given morphine 4 mg 3 doses for total of 12 mg without improvement Which point hospitalist was called for pain management admission. Patient denies any tingling or weakness, no urinary incontinence Of note patient has been endorsing also chest pain. Pateint unable to specify much more regarding this but states she feels bad.  Chest pain was thought to be most likely radiating from the back pain that given advanced age in emergency department father workup has been done. Troponin is 0.02 patient is therapeutic on Coumadin with INR 2.54 Of note patient has known history of COPD followed by pulmonology. She has history of atrial fibrillation on chronic anticoagulation with Coumadin she was as history of symptomatic bradycardia status post pacemaker placement.  Hospitalist was called for admission for pain management secondary to acute compression  fracture  Review of Systems:    Pertinent positives include: Back and chest pain  Constitutional:  No weight loss, night sweats, Fevers, chills, fatigue, weight loss  HEENT:  No headaches, Difficulty swallowing,Tooth/dental problems,Sore throat,  No sneezing, itching, ear ache, nasal congestion, post nasal drip,  Cardio-vascular:   Orthopnea, PND, anasarca, dizziness, palpitations.no Bilateral lower extremity swelling  GI:  No heartburn, indigestion, abdominal pain, nausea, vomiting, diarrhea, change in bowel habits, loss of appetite, melena, blood in stool, hematemesis Resp:  no shortness of breath at rest. No dyspnea on exertion, No excess mucus, no productive cough, No non-productive cough, No coughing up of blood.No change in color of mucus.No wheezing. Skin:  no rash or lesions. No jaundice GU:  no dysuria, change in color of urine, no urgency or frequency. No straining to urinate.  No flank pain.  Musculoskeletal:  No joint pain or no joint swelling. No decreased range of motion. No back pain.  Psych:  No change in mood or affect. No depression or anxiety. No memory loss.  Neuro: no localizing neurological complaints, no tingling, no weakness, no double vision, no gait abnormality, no slurred speech, no confusion  Otherwise ROS are negative except for above, 10 systems were reviewed  Past Medical History: Past Medical History  Diagnosis Date  . Irregular heart beat   . Hypertension   . Hypercholesteremia   . Depression   . Atrial fibrillation (HCC)   . Bowel obstruction (HCC)   . S/P cardiac pacemaker procedure, 02/03/13, Medtronic pacer 02/04/2013    sick sinus syndrome  . CVA (cerebral infarction)   . COPD (chronic obstructive pulmonary disease) (HCC)   . Tobacco abuse     quit 2009  . History of echocardiogram  05/21/2012    TEE; EF 55-60%; mild grade 1 atherosclerosis of prox descending aorta & distal aortic arch  . History of nuclear stress test 11/07/2009     dipyridamole; normal pattern of perfusion; negative for ischemia; low risk   . Carotid artery disease (HCC)     carotid duplex 10/2012 - bilat ICAs normla patency with mod tortuosity of vessel, L vertebral artery demonstrated occlsuve disease   Past Surgical History  Procedure Laterality Date  . Tee without cardioversion  05/21/2012    Procedure: TRANSESOPHAGEAL ECHOCARDIOGRAM (TEE);  Surgeon: Chrystie Nose, MD;  Location: [facility] ENDOSCOPY;  Service: Cardiovascular;  Laterality: N/A;  . Cardioversion  05/21/2012    Procedure: CARDIOVERSION;  Surgeon: Chrystie Nose, MD;  Location: [facility] ENDOSCOPY;  Service: Cardiovascular;  Laterality: N/A;  . Abdominal surgery    . Tonsillectomy    . Appendectomy    . Abdominal hysterectomy    . Colporrhaphy      posterior  . Pacemaker insertion  07/07/2012    medtronic  . Permanent pacemaker insertion Left 02/03/2013    Procedure: PERMANENT PACEMAKER INSERTION;  Surgeon: Thurmon Fair, MD;  Location: [facility] CATH LAB;  Service: Cardiovascular;  Laterality: Left;     Medications: Prior to Admission medications   Medication Sig Start Date End Date Taking? Authorizing Provider  acetaminophen (TYLENOL) 500 MG tablet Take 500 mg by mouth at bedtime as needed for moderate pain. For pain to help sleep   Yes Historical Provider, MD  amiodarone (PACERONE) 200 MG tablet Take 200 mg by mouth every morning.    Yes Historical Provider, MD  beta carotene w/minerals (OCUVITE) tablet Take 1 tablet by mouth every morning.    Yes Historical Provider, MD  calcitonin, salmon, (MIACALCIN/FORTICAL) 200 UNIT/ACT nasal spray Place 1 spray into alternate nostrils every evening.   Yes Historical Provider, MD  calcium-vitamin D (OSCAL WITH D) 500-200 MG-UNIT per tablet Take 1 tablet by mouth daily with breakfast.    Yes Historical Provider, MD  FLUoxetine (PROZAC) 20 MG capsule Take 20 mg by mouth every morning.    Yes Historical Provider, MD  furosemide (LASIX) 40 MG tablet TAKE 1 AND  1/2 TABLET BY MOUTH ONCE DAILY 02/07/15  Yes Mihai [facility], MD  HYDROcodone-acetaminophen (NORCO/VICODIN) 5-325 MG tablet Take 1 tablet by mouth every 8 (eight) hours.   Yes Historical Provider, MD  ipratropium-albuterol (DUONEB) 0.5-2.5 (3) MG/3ML SOLN Take 3 mLs by nebulization every 6 (six) hours as needed. 01/26/14  Yes Waymon Budge, MD  losartan (COZAAR) 50 MG tablet Take 50 mg by mouth every morning.    Yes Historical Provider, MD  mirabegron ER (MYRBETRIQ) 50 MG TB24 tablet Take 50 mg by mouth every evening.    Yes Historical Provider, MD  mirtazapine (REMERON) 15 MG tablet Take 15 mg by mouth at bedtime.   Yes Historical Provider, MD  warfarin (COUMADIN) 5 MG tablet Take 2.5 mg by mouth daily at 6 PM.    Yes Historical Provider, MD    Allergies:   Allergies  Allergen Reactions  . Amoxicillin Other (See Comments)    Can't remember reaction  . Doxycycline Other (See Comments)    Can't remember reaction  . Penicillins Other (See Comments)    Can't remember reaction  . Sulfa Antibiotics Other (See Comments)    Can't remember reaction    Social History:  Ambulatory   independently but has been using walker for past few weeks  Lives at home  With family  reports that she quit smoking about 7 years ago. Her smoking use included Cigarettes. She started smoking about 45 years ago. She has a 38 pack-year smoking history. She has never used smokeless tobacco. She reports that she does not drink alcohol or use illicit drugs.    Family History: family history includes Coronary artery disease in her father; Heart attack in her brother; Stomach cancer in her paternal grandmother.    Physical Exam: Patient Vitals for the past 24 hrs:  BP Temp Temp src Pulse Resp SpO2 Height Weight  04/21/15 2130 127/70 mmHg - - - 16 - - -  04/21/15 2123 155/69 mmHg - - 64 22 96 % - -  04/21/15 2013 147/74 mmHg - - 65 18 98 % - -  04/21/15 1709 103/74 mmHg 97.8 F (36.6 C) Oral 77 18 90 % 5\' 3"   (1.6 m) 84.369 kg (186 lb)    1. General:  in No Acute distress 2. Psychological: Alert and  Oriented 3. Head/ENT:    Dry Mucous Membranes                          Head Non traumatic, neck supple                          Normal   Dentition 4. SKIN: normal   Skin turgor,  Skin clean Dry and intact no rash 5. Heart: Regular rate and rhythm no Murmur, Rub or gallop 6. Lungs: Clear to auscultation bilaterally, no wheezes or crackles   7. Abdomen: Soft, non-tender, Non distended 8. Lower extremities: no clubbing, cyanosis, or edema 9. Neurologically Grossly intact, moving all 4 extremities equally 10. MSK: Normal range of motion Thoracic tenderness to palpation  body mass index is 32.96 kg/(m^2).   Labs on Admission:   Results for orders placed or performed during the hospital encounter of 04/21/15 (from the past 24 hour(s))  Protime-INR     Status: Abnormal   Collection Time: 04/21/15  7:22 PM  Result Value Ref Range   Prothrombin Time 27.0 (H) 11.6 - 15.2 seconds   INR 2.54 (H) 0.00 - 1.49  Basic metabolic panel     Status: Abnormal   Collection Time: 04/21/15  7:22 PM  Result Value Ref Range   Sodium 142 135 - 145 mmol/L   Potassium 2.8 (L) 3.5 - 5.1 mmol/L   Chloride 102 101 - 111 mmol/L   CO2 29 22 - 32 mmol/L   Glucose, Bld 117 (H) 65 - 99 mg/dL   BUN 16 6 - 20 mg/dL   Creatinine, Ser 1.610.98 0.44 - 1.00 mg/dL   Calcium 8.7 (L) 8.9 - 10.3 mg/dL   GFR calc non Af Amer 53 (L) >60 mL/min   GFR calc Af Amer >60 >60 mL/min   Anion gap 11 5 - 15  CBC with Differential     Status: Abnormal   Collection Time: 04/21/15  7:22 PM  Result Value Ref Range   WBC 13.0 (H) 4.0 - 10.5 K/uL   RBC 4.66 3.87 - 5.11 MIL/uL   Hemoglobin 12.4 12.0 - 15.0 g/dL   HCT 09.639.4 04.536.0 - 40.946.0 %   MCV 84.5 78.0 - 100.0 fL   MCH 26.6 26.0 - 34.0 pg   MCHC 31.5 30.0 - 36.0 g/dL   RDW 81.115.4 91.411.5 - 78.215.5 %   Platelets 357 150 - 400 K/uL   Neutrophils Relative % 81 %  Neutro Abs 10.4 (H) 1.7 - 7.7 K/uL    Lymphocytes Relative 9 %   Lymphs Abs 1.2 0.7 - 4.0 K/uL   Monocytes Relative 10 %   Monocytes Absolute 1.4 (H) 0.1 - 1.0 K/uL   Eosinophils Relative 0 %   Eosinophils Absolute 0.0 0.0 - 0.7 K/uL   Basophils Relative 0 %   Basophils Absolute 0.0 0.0 - 0.1 K/uL  I-stat troponin, ED     Status: None   Collection Time: 04/21/15  7:36 PM  Result Value Ref Range   Troponin i, poc 0.02 0.00 - 0.08 ng/mL   Comment 3          Urinalysis, Routine w reflex microscopic     Status: Abnormal   Collection Time: 04/21/15  7:59 PM  Result Value Ref Range   Color, Urine YELLOW YELLOW   APPearance CLOUDY (A) CLEAR   Specific Gravity, Urine 1.016 1.005 - 1.030   pH 6.5 5.0 - 8.0   Glucose, UA NEGATIVE NEGATIVE mg/dL   Hgb urine dipstick SMALL (A) NEGATIVE   Bilirubin Urine NEGATIVE NEGATIVE   Ketones, ur NEGATIVE NEGATIVE mg/dL   Protein, ur NEGATIVE NEGATIVE mg/dL   Nitrite NEGATIVE NEGATIVE   Leukocytes, UA NEGATIVE NEGATIVE  Urine microscopic-add on     Status: Abnormal   Collection Time: 04/21/15  7:59 PM  Result Value Ref Range   Squamous Epithelial / LPF 0-5 (A) NONE SEEN   WBC, UA 0-5 0 - 5 WBC/hpf   RBC / HPF 0-5 0 - 5 RBC/hpf   Bacteria, UA MANY (A) NONE SEEN    UA cloudy many bacteria and negative nitrites no white blood cell count 0-5 squamous epithelials  No results found for: HGBA1C  Estimated Creatinine Clearance: 46.3 mL/min (by C-G formula based on Cr of 0.98).  BNP (last 3 results) No results for input(s): PROBNP in the last 8760 hours.  Other results:  I have pearsonaly reviewed this: ECG REPORT  Rate:69  Rhythm: Somewhat prolonged PR interval ST&T Change: No ischemic changes QTC 480  Filed Weights   04/21/15 1709  Weight: 84.369 kg (186 lb)     Cultures:    Component Value Date/Time   SDES URINE, CATHETERIZED 05/18/2012 2010   SPECREQUEST NONE 05/18/2012 2010   CULT NO GROWTH 05/17/2012 2203   REPTSTATUS 05/19/2012 FINAL 05/18/2012 2010      Radiological Exams on Admission: Dg Chest 2 View  04/21/2015  CLINICAL DATA:  Left chest and back pain EXAM: CHEST  2 VIEW COMPARISON:  03/24/2014 chest radiograph. FINDINGS: Two-vessel left subclavian pacemaker is stable with lead tips overlying the right atrium and right ventricle. Stable cardiomediastinal silhouette with mild cardiomegaly. No pneumothorax. No pleural effusion. Cephalization of the pulmonary vasculature, without overt pulmonary edema. No focal lung consolidation. IMPRESSION: Stable mild cardiomegaly without overt pulmonary edema. No active pulmonary disease. Electronically Signed   By: Delbert Phenix M.D.   On: 04/21/2015 19:19    Chart has been reviewed  Family   at  Bedside  plan of care was discussed with  Son,   Assessment/Plan  81 year old female history of COPD, paroxysmal atrial fibrillation chronic Coumadin, presents with persistent back pain with CT scan worrisome for T6 acute vertebral compression fracture here with intractable pain   Present on Admission:  . Vertebral compression fracture [facility]) - case discussed with cardiology who recommends interventional radiologist consult in the morning. Patient is anticoagulated for history of A. fib will hold off on Coumadin for now  in case showed new choir procedure due to intractable pain  . PAF (paroxysmal atrial fibrillation) (HCC) currently appears to be in sinus continue on amiodarone and we'll hold Coumadin case patient may need to procedure.   . Hypokalemia will replace check magnesium level  . COPD with emphysema (HCC) continue home medications currently appears to be stable patient at baseline on 2 L of oxygen at night  . chronic diastolic congestive heart failure (HCC) continue Lasix currently appears to be euvolemic  . Chest pain atypical but given advanced age will cycle cardiac enzymes obtain serial EKG monitor on telemetry     Prophylaxis:   SCD  CODE STATUS:  FULL CODE  as per patient    Disposition:   likely will need placement for rehabilitation                      Other plan as per orders.  I have spent a total of on this admission  [name] 04/21/2015, 9:53 PM  [facility]  Pager [phone]   after 2 AM please page floor coverage PA If 7AM-7PM, please contact the day team taking care of the patient  [email or web address]  Password [email or web address]

## 2015-04-21 NOTE — ED Notes (Signed)
Nurse drawing labs. 

## 2015-04-21 NOTE — ED Notes (Signed)
Patient c/o mid back pain x 3 weeks. Patient had a CT scan completed 4 days ago and patient's family states a compression fracture.

## 2015-04-22 ENCOUNTER — Observation Stay (HOSPITAL_COMMUNITY): Payer: Medicare Other

## 2015-04-22 DIAGNOSIS — R079 Chest pain, unspecified: Secondary | ICD-10-CM | POA: Diagnosis not present

## 2015-04-22 DIAGNOSIS — T148 Other injury of unspecified body region: Secondary | ICD-10-CM | POA: Diagnosis not present

## 2015-04-22 DIAGNOSIS — M4850XD Collapsed vertebra, not elsewhere classified, site unspecified, subsequent encounter for fracture with routine healing: Secondary | ICD-10-CM

## 2015-04-22 DIAGNOSIS — J441 Chronic obstructive pulmonary disease with (acute) exacerbation: Secondary | ICD-10-CM | POA: Diagnosis not present

## 2015-04-22 DIAGNOSIS — R0603 Acute respiratory distress: Secondary | ICD-10-CM | POA: Insufficient documentation

## 2015-04-22 DIAGNOSIS — J9620 Acute and chronic respiratory failure, unspecified whether with hypoxia or hypercapnia: Secondary | ICD-10-CM | POA: Diagnosis not present

## 2015-04-22 DIAGNOSIS — E876 Hypokalemia: Secondary | ICD-10-CM | POA: Diagnosis not present

## 2015-04-22 DIAGNOSIS — M4850XA Collapsed vertebra, not elsewhere classified, site unspecified, initial encounter for fracture: Secondary | ICD-10-CM | POA: Diagnosis not present

## 2015-04-22 DIAGNOSIS — I480 Paroxysmal atrial fibrillation: Secondary | ICD-10-CM | POA: Diagnosis not present

## 2015-04-22 LAB — COMPREHENSIVE METABOLIC PANEL
ALT: 26 U/L (ref 14–54)
ANION GAP: 11 (ref 5–15)
AST: 33 U/L (ref 15–41)
Albumin: 3.9 g/dL (ref 3.5–5.0)
Alkaline Phosphatase: 63 U/L (ref 38–126)
BILIRUBIN TOTAL: 0.4 mg/dL (ref 0.3–1.2)
BUN: 17 mg/dL (ref 6–20)
CO2: 32 mmol/L (ref 22–32)
Calcium: 9.1 mg/dL (ref 8.9–10.3)
Chloride: 104 mmol/L (ref 101–111)
Creatinine, Ser: 1 mg/dL (ref 0.44–1.00)
GFR calc Af Amer: 60 mL/min — ABNORMAL LOW (ref >60)
GFR, EST NON AFRICAN AMERICAN: 51 mL/min — AB (ref >60)
Glucose, Bld: 110 mg/dL — ABNORMAL HIGH (ref 65–99)
POTASSIUM: 3.7 mmol/L (ref 3.5–5.1)
Sodium: 147 mmol/L — ABNORMAL HIGH (ref 135–145)
TOTAL PROTEIN: 7.3 g/dL (ref 6.5–8.1)

## 2015-04-22 LAB — BLOOD GAS, ARTERIAL
ACID-BASE EXCESS: 3.6 mmol/L — AB (ref 0.0–2.0)
Bicarbonate: 29.6 mEq/L — ABNORMAL HIGH (ref 20.0–24.0)
DRAWN BY: 103701
O2 CONTENT: 3 L/min
O2 SAT: 93.4 %
PATIENT TEMPERATURE: 98.6
PCO2 ART: 54.3 mmHg — AB (ref 35.0–45.0)
TCO2: 27.2 mmol/L (ref 0–100)
pH, Arterial: 7.355 (ref 7.350–7.450)
pO2, Arterial: 72.1 mmHg — ABNORMAL LOW (ref 80.0–100.0)

## 2015-04-22 LAB — CBC
HEMATOCRIT: 40.9 % (ref 36.0–46.0)
Hemoglobin: 12.3 g/dL (ref 12.0–15.0)
MCH: 26.2 pg (ref 26.0–34.0)
MCHC: 30.1 g/dL (ref 30.0–36.0)
MCV: 87.2 fL (ref 78.0–100.0)
PLATELETS: 381 10*3/uL (ref 150–400)
RBC: 4.69 MIL/uL (ref 3.87–5.11)
RDW: 15.6 % — AB (ref 11.5–15.5)
WBC: 14.5 10*3/uL — AB (ref 4.0–10.5)

## 2015-04-22 LAB — PROTIME-INR
INR: 2.46 — ABNORMAL HIGH (ref 0.00–1.49)
Prothrombin Time: 26.4 seconds — ABNORMAL HIGH (ref 11.6–15.2)

## 2015-04-22 LAB — TROPONIN I

## 2015-04-22 LAB — PHOSPHORUS: PHOSPHORUS: 3.7 mg/dL (ref 2.5–4.6)

## 2015-04-22 LAB — TSH: TSH: 1.084 u[IU]/mL (ref 0.350–4.500)

## 2015-04-22 LAB — MAGNESIUM: MAGNESIUM: 2.2 mg/dL (ref 1.7–2.4)

## 2015-04-22 MED ORDER — METHYLPREDNISOLONE SODIUM SUCC 40 MG IJ SOLR
40.0000 mg | Freq: Four times a day (QID) | INTRAMUSCULAR | Status: DC
  Administered 2015-04-22 – 2015-04-23 (×5): 40 mg via INTRAVENOUS
  Filled 2015-04-22 (×5): qty 1

## 2015-04-22 MED ORDER — LEVOFLOXACIN IN D5W 500 MG/100ML IV SOLN
500.0000 mg | INTRAVENOUS | Status: AC
  Administered 2015-04-22: 500 mg via INTRAVENOUS
  Filled 2015-04-22 (×2): qty 100

## 2015-04-22 MED ORDER — DEXTROSE 5 % IV SOLN
1.0000 g | INTRAVENOUS | Status: DC
  Administered 2015-04-23 – 2015-04-24 (×2): 1 g via INTRAVENOUS
  Filled 2015-04-22 (×2): qty 10

## 2015-04-22 MED ORDER — FENTANYL 25 MCG/HR TD PT72
25.0000 ug | MEDICATED_PATCH | TRANSDERMAL | Status: DC
  Administered 2015-04-25 – 2015-05-01 (×3): 25 ug via TRANSDERMAL
  Filled 2015-04-22 (×3): qty 1

## 2015-04-22 MED ORDER — METHYLPREDNISOLONE SODIUM SUCC 125 MG IJ SOLR: 60.0000 mg | Freq: Four times a day (QID) | INTRAMUSCULAR | Status: DC

## 2015-04-22 MED ORDER — LEVALBUTEROL HCL 0.63 MG/3ML IN NEBU
0.6300 mg | INHALATION_SOLUTION | Freq: Four times a day (QID) | RESPIRATORY_TRACT | Status: DC
  Administered 2015-04-22 – 2015-04-24 (×9): 0.63 mg via RESPIRATORY_TRACT
  Filled 2015-04-22 (×9): qty 3

## 2015-04-22 MED ORDER — IPRATROPIUM BROMIDE 0.02 % IN SOLN: 0.5000 mg | RESPIRATORY_TRACT | Status: DC | PRN

## 2015-04-22 MED ORDER — FENTANYL 12 MCG/HR TD PT72
12.5000 ug | MEDICATED_PATCH | Freq: Once | TRANSDERMAL | Status: AC
  Administered 2015-04-22: 12.5 ug via TRANSDERMAL
  Filled 2015-04-22: qty 1

## 2015-04-22 MED ORDER — IPRATROPIUM BROMIDE 0.02 % IN SOLN
0.5000 mg | Freq: Four times a day (QID) | RESPIRATORY_TRACT | Status: DC
  Administered 2015-04-22 – 2015-04-24 (×9): 0.5 mg via RESPIRATORY_TRACT
  Filled 2015-04-22 (×10): qty 2.5

## 2015-04-22 MED ORDER — ARFORMOTEROL TARTRATE 15 MCG/2ML IN NEBU
15.0000 ug | INHALATION_SOLUTION | Freq: Two times a day (BID) | RESPIRATORY_TRACT | Status: DC
  Administered 2015-04-22 – 2015-05-02 (×20): 15 ug via RESPIRATORY_TRACT
  Filled 2015-04-22 (×20): qty 2

## 2015-04-22 MED ORDER — LEVOFLOXACIN IN D5W 500 MG/100ML IV SOLN: 500.0000 mg | INTRAVENOUS | Status: DC

## 2015-04-22 MED ORDER — FENTANYL 12 MCG/HR TD PT72
12.5000 ug | MEDICATED_PATCH | TRANSDERMAL | Status: DC
  Administered 2015-04-22: 12.5 ug via TRANSDERMAL
  Filled 2015-04-22: qty 1

## 2015-04-22 MED ORDER — BISACODYL 10 MG RE SUPP
10.0000 mg | Freq: Once | RECTAL | Status: AC
  Administered 2015-04-22: 10 mg via RECTAL
  Filled 2015-04-22: qty 1

## 2015-04-22 MED ORDER — BUDESONIDE 0.25 MG/2ML IN SUSP
0.2500 mg | Freq: Two times a day (BID) | RESPIRATORY_TRACT | Status: DC
  Administered 2015-04-22 – 2015-05-02 (×20): 0.25 mg via RESPIRATORY_TRACT
  Filled 2015-04-22 (×20): qty 2

## 2015-04-22 MED ORDER — SODIUM CHLORIDE 0.9 % IV SOLN: INTRAVENOUS | Status: DC

## 2015-04-22 MED ORDER — LEVOFLOXACIN IN D5W 250 MG/50ML IV SOLN: 250.0000 mg | INTRAVENOUS | Status: DC

## 2015-04-22 MED ORDER — POLYETHYLENE GLYCOL 3350 17 G PO PACK
17.0000 g | PACK | Freq: Every day | ORAL | Status: DC
  Administered 2015-04-22 – 2015-05-02 (×6): 17 g via ORAL
  Filled 2015-04-22 (×9): qty 1

## 2015-04-22 MED ORDER — SODIUM CHLORIDE 0.9 % IV SOLN
INTRAVENOUS | Status: AC
  Administered 2015-04-22 – 2015-04-23 (×2): via INTRAVENOUS

## 2015-04-22 MED ORDER — ACETAMINOPHEN 500 MG PO TABS
500.0000 mg | ORAL_TABLET | Freq: Three times a day (TID) | ORAL | Status: DC
  Administered 2015-04-22 – 2015-05-02 (×27): 500 mg via ORAL
  Filled 2015-04-22 (×27): qty 1

## 2015-04-22 MED ORDER — HYDROMORPHONE HCL 1 MG/ML IJ SOLN
0.5000 mg | INTRAMUSCULAR | Status: DC | PRN
  Administered 2015-04-22 – 2015-05-02 (×32): 1 mg via INTRAVENOUS
  Filled 2015-04-22 (×35): qty 1

## 2015-04-22 MED ORDER — LEVALBUTEROL HCL 0.63 MG/3ML IN NEBU
0.6300 mg | INHALATION_SOLUTION | RESPIRATORY_TRACT | Status: DC | PRN
  Filled 2015-04-22: qty 3

## 2015-04-22 MED ORDER — SENNOSIDES-DOCUSATE SODIUM 8.6-50 MG PO TABS
1.0000 | ORAL_TABLET | Freq: Every day | ORAL | Status: DC
  Administered 2015-04-22 – 2015-05-01 (×10): 1 via ORAL
  Filled 2015-04-22 (×10): qty 1

## 2015-04-22 MED ORDER — HYDROMORPHONE HCL 1 MG/ML IJ SOLN
0.5000 mg | Freq: Once | INTRAMUSCULAR | Status: AC
  Administered 2015-04-22: 0.5 mg via INTRAVENOUS
  Filled 2015-04-22: qty 1

## 2015-04-22 NOTE — Progress Notes (Signed)
[facility] PROGRESS NOTE  Regina OverlieShirley A Boman RUE:[id number] DOB: Jul 01, 1931 DOA: 04/21/2015 PCP: Kaleen MaskELKINS,WILSON OLIVER, MD  Assessment/Plan: #1 T6 compression fracture Per CT report from [facility]. We'll get a bone scan per interventional radiology recommendations for further evaluation. Continue calcitonin, calcium with Vit D. Place on the Duragesic patch. Oxycodone for breakthrough  pain. Follow.  #2 acute respiratory distress secondary to COPD exacerbation  Patient with thoracoabdominal breathing. Patient with wheezing noted in the anterior lung fields. Will place patient on IV steroids, scheduled nebs, empiric antibiotics, oxygen,pulmicort, brovana. Will consult with critical care medicine for further evaluation and management.  #3 chronic diastolic CHF Stable. Continue home dose Lasix, Cozaar.  #4 atrial fibrillation Continue amiodarone. Anticoagulation on hold in anticipation of possible procedure.  #5 hypokalemia Replete.  #6 prophylaxis SCDs for DVT prophylaxis.   Code Status: Full Family Communication: Updated patient and family at bedside. Disposition Plan: Hopefully once compression fracture addressed by IR, and pain management.   Consultants:  Pulmonary critical care medicine: Dr. Sood11/20/2016  Procedures:  Chest x-ray 04/22/2015  Antibiotics:  IV Rocephin 04/23/2015  HPI/Subjective: Patient complaining of significant back pain. Patient denies any chest pain.  Objective: Filed Vitals:   04/22/15 0542 04/22/15 1425  BP: 154/64 124/82  Pulse: 72 73  Temp: 98 F (36.7 C) 98.1 F (36.7 C)  Resp: 22 22    Intake/Output Summary (Last 24 hours) at 04/22/15 1804 Last data filed at 04/22/15 1400  Gross per 24 hour  Intake      0 ml  Output    900 ml  Net   -900 ml   Filed Weights   04/21/15 1709 04/21/15 2251  Weight: 84.369 kg (186 lb) 81.5 kg (179 lb 10.8 oz)    Exam:   General:  Pursed lip breathing. Use of accessory muscles of  respiration. Thoracoabdominal breathing.  Cardiovascular: Regular rate rhythm no murmurs rubs or gallops.  Respiratory: Thoracoabdominal breathing. Use of accessory muscles of respiration. Wheezing noted in the anterior lung fields.  Abdomen: Soft, nontender, nondistended, positive bowel sounds.  Musculoskeletal: No clubbing cyanosis or edema.  Data Reviewed: Basic Metabolic Panel:  Recent Labs Lab 04/21/15 1922 04/22/15 0522  NA 142 147*  K 2.8* 3.7  CL 102 104  CO2 29 32  GLUCOSE 117* 110*  BUN 16 17  CREATININE 0.98 1.00  CALCIUM 8.7* 9.1  MG  --  2.2  PHOS  --  3.7   Liver Function Tests:  Recent Labs Lab 04/22/15 0522  AST 33  ALT 26  ALKPHOS 63  BILITOT 0.4  PROT 7.3  ALBUMIN 3.9   No results for input(s): LIPASE, AMYLASE in the last 168 hours. No results for input(s): AMMONIA in the last 168 hours. CBC:  Recent Labs Lab 04/21/15 1922 04/22/15 0522  WBC 13.0* 14.5*  NEUTROABS 10.4*  --   HGB 12.4 12.3  HCT 39.4 40.9  MCV 84.5 87.2  PLT 357 381   Cardiac Enzymes:  Recent Labs Lab 04/21/15 2247 04/22/15 0522 04/22/15 1042  TROPONINI <0.03 <0.03 <0.03   BNP (last 3 results) No results for input(s): BNP in the last 8760 hours.  ProBNP (last 3 results) No results for input(s): PROBNP in the last 8760 hours.  CBG: No results for input(s): GLUCAP in the last 168 hours.  No results found for this or any previous visit (from the past 240 hour(s)).   Studies: Dg Chest 2 View  04/21/2015  CLINICAL DATA:  Left chest and back pain EXAM:  CHEST  2 VIEW COMPARISON:  03/24/2014 chest radiograph. FINDINGS: Two-vessel left subclavian pacemaker is stable with lead tips overlying the right atrium and right ventricle. Stable cardiomediastinal silhouette with mild cardiomegaly. No pneumothorax. No pleural effusion. Cephalization of the pulmonary vasculature, without overt pulmonary edema. No focal lung consolidation. IMPRESSION: Stable mild cardiomegaly  without overt pulmonary edema. No active pulmonary disease. Electronically Signed   By: Delbert Phenix M.D.   On: 04/21/2015 19:19   Dg Chest Port 1 View  04/22/2015  CLINICAL DATA:  Respiratory distress EXAM: PORTABLE CHEST 1 VIEW COMPARISON:  04/21/2015 FINDINGS: Cardiac enlargement. Dual lead pacemaker in good position and unchanged. Negative for heart failure Lungs are clear without infiltrate or effusion. IMPRESSION: No active disease. Electronically Signed   By: Marlan Palau M.D.   On: 04/22/2015 14:52    Scheduled Meds: . acetaminophen  500 mg Oral TID  . amiodarone  200 mg Oral q morning - 10a  . antiseptic oral rinse  7 mL Mouth Rinse BID  . arformoterol  15 mcg Nebulization BID  . budesonide (PULMICORT) nebulizer solution  0.25 mg Nebulization BID  . calcitonin (salmon)  1 spray Alternating Nares QPM  . calcium-vitamin D  1 tablet Oral Q breakfast  . [START ON 04/23/2015] cefTRIAXone (ROCEPHIN)  IV  1 g Intravenous Q24H  . fentaNYL  12.5 mcg Transdermal Once  . [START ON 04/25/2015] fentaNYL  25 mcg Transdermal Q72H  . FLUoxetine  20 mg Oral q morning - 10a  . furosemide  20 mg Oral Daily  . ipratropium  0.5 mg Nebulization Q6H  . levalbuterol  0.63 mg Nebulization Q6H  . losartan  50 mg Oral q morning - 10a  . methylPREDNISolone (SOLU-MEDROL) injection  40 mg Intravenous 4 times per day  . mirabegron ER  50 mg Oral QPM  . mirtazapine  15 mg Oral QHS  . polyethylene glycol  17 g Oral Daily  . senna-docusate  1 tablet Oral QHS  . sodium chloride  3 mL Intravenous Q12H  . sodium chloride  3 mL Intravenous Q12H   Continuous Infusions: . sodium chloride 75 mL/hr at 04/22/15 1349    Principal Problem:   Acute on chronic respiratory failure (HCC) Active Problems:   PAF (paroxysmal atrial fibrillation) (HCC)   Acute diastolic congestive heart failure (HCC)   Hypokalemia   S/P cardiac pacemaker procedure, 02/03/13, Medtronic pacer   COPD with emphysema (HCC)   Chest pain    Compression fracture   Vertebral compression fracture (HCC)   Pain in the chest   COPD exacerbation (HCC)   Respiratory distress    Time spent: 35 mins    [facility] MD [facility] Pager [phone]. If 7PM-7AM, please contact night-coverage at [email or web address], password [email or web address] 04/22/2015, 6:04 PM

## 2015-04-22 NOTE — Progress Notes (Addendum)
IR aware of request for KP of symptomatic T6 fracture. Pt with hx of COPD, afib on Coumadin, Coumadin has been held for possible procedure Pt [facility], we do not have images here, but have seen report. Cannot get MRI due to pacemaker. Please order NM bone scan to further eval T6 fracture. IR will follow up results and see pt to discuss potential procedure.  Brayton ElKevin Rashia Mckesson PA-C Interventional Radiology 04/22/2015 8:14 AM

## 2015-04-22 NOTE — Consult Note (Signed)
PULMONARY / CRITICAL CARE MEDICINE   Name: [name] MRN: [id number] DOB: 12-15-1931    ADMISSION DATE:  04/21/2015 CONSULTATION DATE:  04/22/2015  REFERRING MD :  Dr. Janee Mornhompson  CHIEF COMPLAINT:  Back pain  HISTORY OF PRESENT ILLNESS:   81 yo female former smoker with 3 weeks of back pain.  Found to have T4 compression fracture.  She has hx of COPD, and is followed by Dr. Maple HudsonYoung in pulmonary office.  She was having more trouble with her breathing and PCCM consulted.  She was having some wheeze >> better after getting nebs and solu medrol.  She feels her breathing is limited due to back pain.  Her ABG showed compensated respiratory acidosis.  PAST MEDICAL HISTORY :  She  has a past medical history of Irregular heart beat; Hypertension; Hypercholesteremia; Depression; Atrial fibrillation (HCC); Bowel obstruction (HCC); S/P cardiac pacemaker procedure, 02/03/13, Medtronic pacer (02/04/2013); CVA (cerebral infarction); COPD (chronic obstructive pulmonary disease) (HCC); Tobacco abuse; History of echocardiogram (05/21/2012); History of nuclear stress test (11/07/2009); and Carotid artery disease (HCC).  PAST SURGICAL HISTORY: She  has past surgical history that includes TEE without cardioversion (05/21/2012); Cardioversion (05/21/2012); Abdominal surgery; Tonsillectomy; Appendectomy; Abdominal hysterectomy; Colporrhaphy; Pacemaker insertion (07/07/2012); and permanent pacemaker insertion (Left, 02/03/2013).  Allergies  Allergen Reactions  . Amoxicillin Other (See Comments)    Can't remember reaction  . Doxycycline Other (See Comments)    Can't remember reaction  . Penicillins Other (See Comments)    Can't remember reaction  . Sulfa Antibiotics Other (See Comments)    Can't remember reaction    No current facility-administered medications on file prior to encounter.   Current Outpatient Prescriptions on File Prior to Encounter  Medication Sig  . acetaminophen (TYLENOL) 500 MG tablet Take  500 mg by mouth at bedtime as needed for moderate pain. For pain to help sleep  . amiodarone (PACERONE) 200 MG tablet Take 200 mg by mouth every morning.   . beta carotene w/minerals (OCUVITE) tablet Take 1 tablet by mouth every morning.   . calcium-vitamin D (OSCAL WITH D) 500-200 MG-UNIT per tablet Take 1 tablet by mouth daily with breakfast.   . FLUoxetine (PROZAC) 20 MG capsule Take 20 mg by mouth every morning.   . furosemide (LASIX) 40 MG tablet TAKE 1 AND 1/2 TABLET BY MOUTH ONCE DAILY  . ipratropium-albuterol (DUONEB) 0.5-2.5 (3) MG/3ML SOLN Take 3 mLs by nebulization every 6 (six) hours as needed.  Marland Kitchen. losartan (COZAAR) 50 MG tablet Take 50 mg by mouth every morning.   . mirabegron ER (MYRBETRIQ) 50 MG TB24 tablet Take 50 mg by mouth every evening.   . mirtazapine (REMERON) 15 MG tablet Take 15 mg by mouth at bedtime.  Marland Kitchen. warfarin (COUMADIN) 5 MG tablet Take 2.5 mg by mouth daily at 6 PM.     FAMILY HISTORY:  Her indicated that her mother is deceased. She indicated that her father is deceased. She indicated that her sister is alive. She indicated that her brother is deceased. She indicated that her maternal grandmother is deceased. She indicated that her maternal grandfather is deceased. She indicated that her paternal grandmother is deceased. She indicated that her paternal grandfather is deceased.   SOCIAL HISTORY: She  reports that she quit smoking about 7 years ago. Her smoking use included Cigarettes. She started smoking about 45 years ago. She has a 38 pack-year smoking history. She has never used smokeless tobacco. She reports that she does not drink alcohol or use  illicit drugs.  REVIEW OF SYSTEMS:   Negative except above.  SUBJECTIVE:   VITAL SIGNS: BP 124/82 mmHg  Pulse 73  Temp(Src) 98.1 F (36.7 C) (Axillary)  Resp 22  Ht  (1.6 m)  Wt 179 lb 10.8 oz (81.5 kg)  BMI 31.84 kg/m2  SpO2 99%  INTAKE / OUTPUT: I/O last 3 completed shifts: In: -  Out: 600  [Urine:600]  PHYSICAL EXAMINATION: General:  pleasant Neuro:  Alert, normal strength, CN intact HEENT:  Raspy voice, no sinus tenderness Cardiovascular:  irregular Lungs:  Decreased breath sounds, no wheezing (received neb tx earlier) Abdomen:  Soft, non tender Musculoskeletal:  No edema Skin:  No rashes  LABS:  CBC  Recent Labs Lab 04/21/15 1922 04/22/15 0522  WBC 13.0* 14.5*  HGB 12.4 12.3  HCT 39.4 40.9  PLT 357 381   Coag's  Recent Labs Lab 04/21/15 1922 04/22/15 0522  INR 2.54* 2.46*   BMET  Recent Labs Lab 04/21/15 1922 04/22/15 0522  NA 142 147*  K 2.8* 3.7  CL 102 104  CO2 29 32  BUN 16 17  CREATININE 0.98 1.00  GLUCOSE 117* 110*   Electrolytes  Recent Labs Lab 04/21/15 1922 04/22/15 0522  CALCIUM 8.7* 9.1  MG  --  2.2  PHOS  --  3.7   ABG  Recent Labs Lab 04/22/15 1430  PHART 7.355  PCO2ART 54.3*  PO2ART 72.1*   Liver Enzymes  Recent Labs Lab 04/22/15 0522  AST 33  ALT 26  ALKPHOS 63  BILITOT 0.4  ALBUMIN 3.9   Cardiac Enzymes  Recent Labs Lab 04/21/15 2247 04/22/15 0522 04/22/15 1042  TROPONINI <0.03 <0.03 <0.03   Imaging Dg Chest 2 View  04/21/2015  CLINICAL DATA:  Left chest and back pain EXAM: CHEST  2 VIEW COMPARISON:  03/24/2014 chest radiograph. FINDINGS: Two-vessel left subclavian pacemaker is stable with lead tips overlying the right atrium and right ventricle. Stable cardiomediastinal silhouette with mild cardiomegaly. No pneumothorax. No pleural effusion. Cephalization of the pulmonary vasculature, without overt pulmonary edema. No focal lung consolidation. IMPRESSION: Stable mild cardiomegaly without overt pulmonary edema. No active pulmonary disease. Electronically Signed   By: Delbert Phenix M.D.   On: 04/21/2015 19:19   Dg Chest Port 1 View  04/22/2015  CLINICAL DATA:  Respiratory distress EXAM: PORTABLE CHEST 1 VIEW COMPARISON:  04/21/2015 FINDINGS: Cardiac enlargement. Dual lead pacemaker in good  position and unchanged. Negative for heart failure Lungs are clear without infiltrate or effusion. IMPRESSION: No active disease. Electronically Signed   By: Marlan Palau M.D.   On: 04/22/2015 14:52     STUDIES:   CULTURES: Urine 11/20 >>  ANTIBIOTICS: Levaquin 11/20 >>  SIGNIFICANT EVENTS: 11/19 Admit 11/20 IR consulted, transferred to SDU  LINES/TUBES:  DISCUSSION: 81 yo female with back pain from T4 compression fx.  She has decreased respiratory excursion 2nd to back pain, and COPD exacerbation.  She reports improvement in symptoms with neb tx and solu medrol.  ASSESSMENT / PLAN:  AECOPD with bronchodilator responsiveness. Acute on chronic hypoxic/hypercapnic respiratory failure. Decreased respiratory excursion due to back pain. Plan: - okay to keep on telemetry - continue oxygen to keep SpO2 90 to 95% - continue BD's, solu medrol, levaquin per primary team  Chronic diastolic CHF. A fib. Plan: - per primary team  T4 compression fx. Plan: - per primary team and IR  D/w Dr. Janee Morn.  PCCM can be available as needed >> call if additional assistance needed.  Coralyn Helling, MD [facility] Pulmonary/Critical Care 04/22/2015, 3:31 PM Pager:  [phone] After 3pm call: [phone]

## 2015-04-23 ENCOUNTER — Observation Stay (HOSPITAL_COMMUNITY): Payer: Medicare Other

## 2015-04-23 ENCOUNTER — Ambulatory Visit (HOSPITAL_COMMUNITY): Payer: Medicare Other

## 2015-04-23 ENCOUNTER — Ambulatory Visit (INDEPENDENT_AMBULATORY_CARE_PROVIDER_SITE_OTHER): Payer: Medicare Other | Admitting: Ophthalmology

## 2015-04-23 ENCOUNTER — Other Ambulatory Visit: Payer: Self-pay

## 2015-04-23 DIAGNOSIS — I480 Paroxysmal atrial fibrillation: Secondary | ICD-10-CM | POA: Diagnosis not present

## 2015-04-23 DIAGNOSIS — J9620 Acute and chronic respiratory failure, unspecified whether with hypoxia or hypercapnia: Secondary | ICD-10-CM | POA: Diagnosis not present

## 2015-04-23 DIAGNOSIS — E876 Hypokalemia: Secondary | ICD-10-CM | POA: Diagnosis not present

## 2015-04-23 DIAGNOSIS — J441 Chronic obstructive pulmonary disease with (acute) exacerbation: Secondary | ICD-10-CM | POA: Diagnosis not present

## 2015-04-23 LAB — CBC
HCT: 38.1 % (ref 36.0–46.0)
Hemoglobin: 11.7 g/dL — ABNORMAL LOW (ref 12.0–15.0)
MCH: 26.2 pg (ref 26.0–34.0)
MCHC: 30.7 g/dL (ref 30.0–36.0)
MCV: 85.4 fL (ref 78.0–100.0)
PLATELETS: 324 10*3/uL (ref 150–400)
RBC: 4.46 MIL/uL (ref 3.87–5.11)
RDW: 14.7 % (ref 11.5–15.5)
WBC: 10.1 10*3/uL (ref 4.0–10.5)

## 2015-04-23 LAB — URINE CULTURE

## 2015-04-23 LAB — BASIC METABOLIC PANEL
ANION GAP: 7 (ref 5–15)
BUN: 18 mg/dL (ref 6–20)
CALCIUM: 8.9 mg/dL (ref 8.9–10.3)
CO2: 30 mmol/L (ref 22–32)
Chloride: 103 mmol/L (ref 101–111)
Creatinine, Ser: 0.73 mg/dL (ref 0.44–1.00)
GLUCOSE: 156 mg/dL — AB (ref 65–99)
POTASSIUM: 4.1 mmol/L (ref 3.5–5.1)
Sodium: 140 mmol/L (ref 135–145)

## 2015-04-23 LAB — PROTIME-INR
INR: 2.53 — AB (ref 0.00–1.49)
Prothrombin Time: 26.9 seconds — ABNORMAL HIGH (ref 11.6–15.2)

## 2015-04-23 MED ORDER — METHYLPREDNISOLONE SODIUM SUCC 40 MG IJ SOLR
40.0000 mg | Freq: Two times a day (BID) | INTRAMUSCULAR | Status: DC
  Administered 2015-04-24 (×2): 40 mg via INTRAVENOUS
  Filled 2015-04-23 (×2): qty 1

## 2015-04-23 MED ORDER — TECHNETIUM TC 99M MEDRONATE IV KIT
25.0000 | PACK | Freq: Once | INTRAVENOUS | Status: AC | PRN
Start: 2015-04-23 — End: 2015-04-23
  Administered 2015-04-23: 25.6 via INTRAVENOUS

## 2015-04-23 NOTE — Progress Notes (Signed)
[facility] PROGRESS NOTE  Regina OverlieShirley A [name] ZOX:[id number] DOB: 02-Jan-1932 DOA: 04/21/2015 PCP: Kaleen MaskELKINS,WILSON OLIVER, MD  Assessment/Plan: #1 T6 compression fracture Per CT report from [facility]. Patient with some improvement with pain. Bone scan would increase activity over the midthoracic spine at approximately T6. Plain films of the T-spine with severe compression deformity of T6 vertebral body noted consistent with compression fracture of indeterminant age. Continue calcitonin, calcium with Vit D. Continue Duragesic patch. Dilaudid/Oxycodone for breakthrough  pain. IR ff for further evaluation and rxcs. Follow.  #2 acute respiratory distress secondary to COPD exacerbation  Patient with clinical improvement. Shortness of breath has improved. Wheezing has improved. Continue scheduled nebs, empiric antibiotics, oxygen,pulmicort, brovana. IV steroid taper. Patient was seen by critical care medicine and recommended continue current treatment regimen.   #3 chronic diastolic CHF Stable. Continue home dose Lasix, Cozaar.  #4 atrial fibrillation Continue amiodarone. Anticoagulation on hold in anticipation of possible procedure.  #5 hypokalemia Replete.  #6 prophylaxis SCDs for DVT prophylaxis.   Code Status: Full Family Communication: Updated patient and family at bedside. Disposition Plan: Hopefully once compression fracture addressed by IR, and pain management.   Consultants:  Pulmonary critical care medicine: Dr. Sood11/20/2016  Procedures:  Chest x-ray 04/22/2015  Plain films of the T-spine 04/23/2015  Bone scan 04/23/2015  Antibiotics:  IV Rocephin 04/23/2015  HPI/Subjective: Patient states some improvement with her back pain. No chest pain. Breathing is better.  Objective: Filed Vitals:   04/23/15 0629 04/23/15 1100  BP: 139/81 121/64  Pulse: 73 78  Temp: 97.7 F (36.5 C)   Resp: 22 20    Intake/Output Summary (Last 24 hours) at 04/23/15 1155 Last data  filed at 04/23/15 0636  Gross per 24 hour  Intake 1673.75 ml  Output   1100 ml  Net 573.75 ml   Filed Weights   04/21/15 1709 04/21/15 2251  Weight: 84.369 kg (186 lb) 81.5 kg (179 lb 10.8 oz)    Exam:   General:  NAD  Cardiovascular: Regular rate rhythm no murmurs rubs or gallops.  Respiratory: Clear to auscultation bilaterally in anterior lung fields.  Abdomen: Soft, nontender, nondistended, positive bowel sounds.  Musculoskeletal: No clubbing cyanosis or edema.  Data Reviewed: Basic Metabolic Panel:  Recent Labs Lab 04/21/15 1922 04/22/15 0522 04/23/15 0545  NA 142 147* 140  K 2.8* 3.7 4.1  CL 102 104 103  CO2 29 32 30  GLUCOSE 117* 110* 156*  BUN 16 17 18   CREATININE 0.98 1.00 0.73  CALCIUM 8.7* 9.1 8.9  MG  --  2.2  --   PHOS  --  3.7  --    Liver Function Tests:  Recent Labs Lab 04/22/15 0522  AST 33  ALT 26  ALKPHOS 63  BILITOT 0.4  PROT 7.3  ALBUMIN 3.9   No results for input(s): LIPASE, AMYLASE in the last 168 hours. No results for input(s): AMMONIA in the last 168 hours. CBC:  Recent Labs Lab 04/21/15 1922 04/22/15 0522 04/23/15 0545  WBC 13.0* 14.5* 10.1  NEUTROABS 10.4*  --   --   HGB 12.4 12.3 11.7*  HCT 39.4 40.9 38.1  MCV 84.5 87.2 85.4  PLT 357 381 324   Cardiac Enzymes:  Recent Labs Lab 04/21/15 2247 04/22/15 0522 04/22/15 1042  TROPONINI <0.03 <0.03 <0.03   BNP (last 3 results) No results for input(s): BNP in the last 8760 hours.  ProBNP (last 3 results) No results for input(s): PROBNP in the last 8760 hours.  CBG:  No results for input(s): GLUCAP in the last 168 hours.  Recent Results (from the past 240 hour(s))  Culture, Urine     Status: None   Collection Time: 04/21/15  8:02 PM  Result Value Ref Range Status   Specimen Description URINE, CATHETERIZED  Final   Special Requests NONE  Final   Culture   Final    MULTIPLE SPECIES PRESENT, SUGGEST RECOLLECTION Performed at [facility]    Report  Status 04/23/2015 FINAL  Final     Studies: Dg Chest 2 View  04/21/2015  CLINICAL DATA:  Left chest and back pain EXAM: CHEST  2 VIEW COMPARISON:  03/24/2014 chest radiograph. FINDINGS: Two-vessel left subclavian pacemaker is stable with lead tips overlying the right atrium and right ventricle. Stable cardiomediastinal silhouette with mild cardiomegaly. No pneumothorax. No pleural effusion. Cephalization of the pulmonary vasculature, without overt pulmonary edema. No focal lung consolidation. IMPRESSION: Stable mild cardiomegaly without overt pulmonary edema. No active pulmonary disease. Electronically Signed   By: Delbert Phenix M.D.   On: 04/21/2015 19:19   Dg Chest Port 1 View  04/22/2015  CLINICAL DATA:  Respiratory distress EXAM: PORTABLE CHEST 1 VIEW COMPARISON:  04/21/2015 FINDINGS: Cardiac enlargement. Dual lead pacemaker in good position and unchanged. Negative for heart failure Lungs are clear without infiltrate or effusion. IMPRESSION: No active disease. Electronically Signed   By: Marlan Palau M.D.   On: 04/22/2015 14:52    Scheduled Meds: . acetaminophen  500 mg Oral TID  . amiodarone  200 mg Oral q morning - 10a  . antiseptic oral rinse  7 mL Mouth Rinse BID  . arformoterol  15 mcg Nebulization BID  . budesonide (PULMICORT) nebulizer solution  0.25 mg Nebulization BID  . calcitonin (salmon)  1 spray Alternating Nares QPM  . calcium-vitamin D  1 tablet Oral Q breakfast  . cefTRIAXone (ROCEPHIN)  IV  1 g Intravenous Q24H  . fentaNYL  12.5 mcg Transdermal Once  . [START ON 04/25/2015] fentaNYL  25 mcg Transdermal Q72H  . FLUoxetine  20 mg Oral q morning - 10a  . furosemide  20 mg Oral Daily  . ipratropium  0.5 mg Nebulization Q6H  . levalbuterol  0.63 mg Nebulization Q6H  . losartan  50 mg Oral q morning - 10a  . methylPREDNISolone (SOLU-MEDROL) injection  40 mg Intravenous 4 times per day  . mirabegron ER  50 mg Oral QPM  . mirtazapine  15 mg Oral QHS  . polyethylene  glycol  17 g Oral Daily  . senna-docusate  1 tablet Oral QHS  . sodium chloride  3 mL Intravenous Q12H  . sodium chloride  3 mL Intravenous Q12H   Continuous Infusions: . sodium chloride 75 mL/hr at 04/22/15 1349    Principal Problem:   Acute on chronic respiratory failure (HCC) Active Problems:   PAF (paroxysmal atrial fibrillation) (HCC)   Acute diastolic congestive heart failure (HCC)   Hypokalemia   S/P cardiac pacemaker procedure, 02/03/13, [employer]tr[employer] pacer   COPD with emphysema (HCC)   Chest pain   Compression fracture   Vertebral compression fracture (HCC)   Pain in the chest   COPD exacerbation (HCC)   Respiratory distress    Time spent: 35 mins    [facility] MD [facility] Pager [phone]. If 7PM-7AM, please contact night-coverage at [email or web address], password [facility] 04/23/2015, 11:55 AM

## 2015-04-24 DIAGNOSIS — M4850XA Collapsed vertebra, not elsewhere classified, site unspecified, initial encounter for fracture: Secondary | ICD-10-CM | POA: Diagnosis not present

## 2015-04-24 DIAGNOSIS — J441 Chronic obstructive pulmonary disease with (acute) exacerbation: Secondary | ICD-10-CM | POA: Diagnosis not present

## 2015-04-24 DIAGNOSIS — R079 Chest pain, unspecified: Secondary | ICD-10-CM | POA: Diagnosis not present

## 2015-04-24 DIAGNOSIS — J9620 Acute and chronic respiratory failure, unspecified whether with hypoxia or hypercapnia: Secondary | ICD-10-CM | POA: Diagnosis not present

## 2015-04-24 LAB — CBC
HCT: 36 % (ref 36.0–46.0)
Hemoglobin: 11 g/dL — ABNORMAL LOW (ref 12.0–15.0)
MCH: 26.2 pg (ref 26.0–34.0)
MCHC: 30.6 g/dL (ref 30.0–36.0)
MCV: 85.7 fL (ref 78.0–100.0)
PLATELETS: 349 10*3/uL (ref 150–400)
RBC: 4.2 MIL/uL (ref 3.87–5.11)
RDW: 15 % (ref 11.5–15.5)
WBC: 18.8 10*3/uL — AB (ref 4.0–10.5)

## 2015-04-24 LAB — BASIC METABOLIC PANEL
ANION GAP: 8 (ref 5–15)
BUN: 21 mg/dL — ABNORMAL HIGH (ref 6–20)
CALCIUM: 9.1 mg/dL (ref 8.9–10.3)
CO2: 33 mmol/L — ABNORMAL HIGH (ref 22–32)
Chloride: 101 mmol/L (ref 101–111)
Creatinine, Ser: 0.76 mg/dL (ref 0.44–1.00)
GLUCOSE: 177 mg/dL — AB (ref 65–99)
Potassium: 4.2 mmol/L (ref 3.5–5.1)
SODIUM: 142 mmol/L (ref 135–145)

## 2015-04-24 LAB — PROTIME-INR
INR: 2.9 — AB (ref 0.00–1.49)
INR: 2.55 — ABNORMAL HIGH (ref 0.00–1.49)
PROTHROMBIN TIME: 29.9 s — AB (ref 11.6–15.2)
PROTHROMBIN TIME: 27.1 s — AB (ref 11.6–15.2)

## 2015-04-24 MED ORDER — CEFUROXIME AXETIL 500 MG PO TABS
500.0000 mg | ORAL_TABLET | Freq: Two times a day (BID) | ORAL | Status: DC
Start: 2015-04-25 — End: 2015-04-25
  Administered 2015-04-25: 500 mg via ORAL
  Filled 2015-04-24 (×3): qty 1

## 2015-04-24 MED ORDER — PREDNISONE 20 MG PO TABS: 60.0000 mg | ORAL_TABLET | Freq: Every day | ORAL | Status: DC

## 2015-04-24 MED ORDER — IPRATROPIUM BROMIDE 0.02 % IN SOLN
0.5000 mg | Freq: Four times a day (QID) | RESPIRATORY_TRACT | Status: DC | PRN
  Filled 2015-04-24: qty 2.5

## 2015-04-24 MED ORDER — LEVALBUTEROL HCL 0.63 MG/3ML IN NEBU
0.6300 mg | INHALATION_SOLUTION | Freq: Four times a day (QID) | RESPIRATORY_TRACT | Status: DC
  Administered 2015-04-25 (×2): 0.63 mg via RESPIRATORY_TRACT
  Filled 2015-04-24 (×2): qty 3

## 2015-04-24 MED ORDER — DOCUSATE SODIUM 100 MG PO CAPS
100.0000 mg | ORAL_CAPSULE | Freq: Two times a day (BID) | ORAL | Status: DC
  Administered 2015-04-24 – 2015-05-02 (×15): 100 mg via ORAL
  Filled 2015-04-24 (×17): qty 1

## 2015-04-24 MED ORDER — PHYTONADIONE 5 MG PO TABS
2.5000 mg | ORAL_TABLET | Freq: Once | ORAL | Status: AC
  Administered 2015-04-24: 2.5 mg via ORAL
  Filled 2015-04-24: qty 1

## 2015-04-24 MED ORDER — IPRATROPIUM BROMIDE 0.02 % IN SOLN
0.5000 mg | Freq: Three times a day (TID) | RESPIRATORY_TRACT | Status: DC
  Administered 2015-04-25 – 2015-05-02 (×22): 0.5 mg via RESPIRATORY_TRACT
  Filled 2015-04-24 (×21): qty 2.5

## 2015-04-24 MED ORDER — LEVALBUTEROL HCL 0.63 MG/3ML IN NEBU
0.6300 mg | INHALATION_SOLUTION | RESPIRATORY_TRACT | Status: DC | PRN
Start: 2015-04-24 — End: 2015-04-25

## 2015-04-24 NOTE — Progress Notes (Signed)
[facility] PROGRESS NOTE  [name] ZOX:[id number] DOB: [birth year] DOA: 04/21/2015 PCP: [name], MD  Assessment/Plan: #1 T6 compression fracture Per CT report from [facility]. Patient with some improvement with pain. Bone scan with increased activity over the midthoracic spine at approximately T6. Plain films of the T-spine with severe compression deformity of T6 vertebral body noted consistent with compression fracture of indeterminant age. Continue calcitonin, calcium with Vit D. Continue Duragesic patch. Dilaudid/Oxycodone for breakthrough  pain. IR has assessed patient and patient for kyphoplasty once INR less than 1.6. INR today is 2.90. Will give a dose of oral vitamin K 2.5 mg by mouth 1. Repeat INR in the morning. Follow.  #2 acute respiratory distress secondary to COPD exacerbation  Patient with clinical improvement. Shortness of breath has improved. Wheezing has improved. Continue scheduled nebs, empiric antibiotics, oxygen,pulmicort, brovana. IV steroid taper. Patient was seen by critical care medicine and recommended continue current treatment regimen.   #3 chronic diastolic CHF Stable. Continue home dose Lasix, Cozaar.  #4 atrial fibrillation Continue amiodarone. Anticoagulation on hold in anticipation of possible procedure. INR currently at 2.90.  #5 hypokalemia Repleted.  #6 prophylaxis SCDs for DVT prophylaxis.   Code Status: Full Family Communication: Updated patient. No family at bedside. Disposition Plan: Hopefully once compression fracture addressed by IR, and pain management. PT evaluation pending.   Consultants:  Pulmonary critical care medicine: Dr. Sood11/20/2016  Interventional radiology: Dr. Loreta Ave 04/22/2015  Procedures:  Chest x-ray 04/22/2015  Plain films of the T-spine 04/23/2015  Bone scan 04/23/2015  Antibiotics:  IV Rocephin 04/23/2015  HPI/Subjective: Patient states some improvement with her back pain. Shortness  of breath has improved. No chest pain. Patient asking when she can eat.  Objective: Filed Vitals:   04/23/15 2218 04/24/15 0431  BP: 159/76 147/79  Pulse: 74 67  Temp: 98.2 F (36.8 C) 97.7 F (36.5 C)  Resp: 20 16    Intake/Output Summary (Last 24 hours) at 04/24/15 1243 Last data filed at 04/24/15 1118  Gross per 24 hour  Intake     50 ml  Output   1000 ml  Net   -950 ml   Filed Weights   04/21/15 1709 04/21/15 2251  Weight: 84.369 kg (186 lb) 81.5 kg (179 lb 10.8 oz)    Exam:   General:  NAD  Cardiovascular: Regular rate rhythm no murmurs rubs or gallops.  Respiratory: Clear to auscultation bilaterally in anterior lung fields.  Abdomen: Soft, nontender, nondistended, positive bowel sounds.  Musculoskeletal: No clubbing cyanosis or edema.  Data Reviewed: Basic Metabolic Panel:  Recent Labs Lab 04/21/15 1922 04/22/15 0522 04/23/15 0545 04/24/15 0809  NA 142 147* 140 142  K 2.8* 3.7 4.1 4.2  CL 102 104 103 101  CO2 29 32 30 33*  GLUCOSE 117* 110* 156* 177*  BUN 21*  CREATININE 0.98 1.00 0.73 0.76  CALCIUM 8.7* 9.1 8.9 9.1  MG  --  2.2  --   --   PHOS  --  3.7  --   --    Liver Function Tests:  Recent Labs Lab 04/22/15 0522  AST 33  ALT 26  ALKPHOS 63  BILITOT 0.4  PROT 7.3  ALBUMIN 3.9   No results for input(s): LIPASE, AMYLASE in the last 168 hours. No results for input(s): AMMONIA in the last 168 hours. CBC:  Recent Labs Lab 04/21/15 1922 04/22/15 0522 04/23/15 0545 04/24/15 0809  WBC 13.0* 14.5* 10.1 18.8*  NEUTROABS 10.4*  --   --   --  HGB 12.4 12.3 11.7* 11.0*  HCT 39.4 40.9 38.1 36.0  MCV 84.5 87.2 85.4 85.7  PLT 357 381 324 349   Cardiac Enzymes:  Recent Labs Lab 04/21/15 2247 04/22/15 0522 04/22/15 1042  TROPONINI <0.03 <0.03 <0.03   BNP (last 3 results) No results for input(s): BNP in the last 8760 hours.  ProBNP (last 3 results) No results for input(s): PROBNP in the last 8760 hours.  CBG: No  results for input(s): GLUCAP in the last 168 hours.  Recent Results (from the past 240 hour(s))  Culture, Urine     Status: None   Collection Time: 04/21/15  8:02 PM  Result Value Ref Range Status   Specimen Description URINE, CATHETERIZED  Final   Special Requests NONE  Final   Culture   Final    MULTIPLE SPECIES PRESENT, SUGGEST RECOLLECTION [facility]    Report Status 04/23/2015 FINAL  Final     Studies: Dg Thoracic Spine 2 View  04/23/2015  CLINICAL DATA:  Compression fracture. EXAM: THORACIC SPINE 2 VIEWS COMPARISON:  Chest radiograph of April 21, 2015. Nuclear medicine study of April 23, 2015. FINDINGS: Severe wedge compression deformity of T6 vertebral body is noted consistent with compression fracture of indeterminate age. No spondylolisthesis is noted. IMPRESSION: Severe compression deformity of T6 vertebral body is noted consistent with compression fracture of indeterminate age. This corresponds in location to abnormality seen on nuclear medicine study. Electronically Signed   By: Lupita RaiderJames  Green Jr, M.D.   On: 04/23/2015 14:11   Nm Bone Scan Whole Body  04/23/2015  CLINICAL DATA:  Intractable pain. Possible T6 compression fracture . EXAM: NUCLEAR MEDICINE WHOLE BODY BONE SCAN TECHNIQUE: Whole body anterior and posterior images were obtained approximately 3 hours after intravenous injection of radiopharmaceutical. RADIOPHARMACEUTICALS:  25.6 mCi Technetium-6540m MDP IV COMPARISON:  Thoracic spine series 11 06/21/2014. FINDINGS: Bilateral renal function excretion. Increased activity noted over the mid thoracic spine at approximately T6. No other focal abnormalities identified. IMPRESSION: Increased activity noted over the mid thoracic spine at approximately T6. This could correspond to previously questioned T6 compression fracture. Electronically Signed   By: Maisie Fushomas  Register   On: 04/23/2015 13:32   Dg Chest Port 1 View  04/22/2015  CLINICAL DATA:   Respiratory distress EXAM: PORTABLE CHEST 1 VIEW COMPARISON:  04/21/2015 FINDINGS: Cardiac enlargement. Dual lead pacemaker in good position and unchanged. Negative for heart failure Lungs are clear without infiltrate or effusion. IMPRESSION: No active disease. Electronically Signed   By: Marlan Palauharles  Clark M.D.   On: 04/22/2015 14:52    Scheduled Meds: . acetaminophen  500 mg Oral TID  . amiodarone  200 mg Oral q morning - 10a  . antiseptic oral rinse  7 mL Mouth Rinse BID  . arformoterol  15 mcg Nebulization BID  . budesonide (PULMICORT) nebulizer solution  0.25 mg Nebulization BID  . calcitonin (salmon)  1 spray Alternating Nares QPM  . calcium-vitamin D  1 tablet Oral Q breakfast  . cefTRIAXone (ROCEPHIN)  IV  1 g Intravenous Q24H  . docusate sodium  100 mg Oral BID  . fentaNYL  12.5 mcg Transdermal Once  . [START ON 04/25/2015] fentaNYL  25 mcg Transdermal Q72H  . FLUoxetine  20 mg Oral q morning - 10a  . furosemide  20 mg Oral Daily  . ipratropium  0.5 mg Nebulization Q6H  . levalbuterol  0.63 mg Nebulization Q6H  . losartan  50 mg Oral q morning - 10a  .  methylPREDNISolone (SOLU-MEDROL) injection  40 mg Intravenous Q12H  . mirabegron ER  50 mg Oral QPM  . mirtazapine  15 mg Oral QHS  . phytonadione  2.5 mg Oral Once  . polyethylene glycol  17 g Oral Daily  . senna-docusate  1 tablet Oral QHS  . sodium chloride  3 mL Intravenous Q12H  . sodium chloride  3 mL Intravenous Q12H   Continuous Infusions:    Principal Problem:   Acute on chronic respiratory failure (HCC) Active Problems:   PAF (paroxysmal atrial fibrillation) (HCC)   Acute diastolic congestive heart failure (HCC)   Hypokalemia   S/P cardiac pacemaker procedure, 02/03/13, Medtronic pacer   COPD with emphysema (HCC)   Chest pain   Compression fracture   Vertebral compression fracture (HCC)   Pain in the chest   COPD exacerbation (HCC)   Respiratory distress    Time spent: 35  mins    [facility] MD [facility] Pager [phone]. If 7PM-7AM, please contact night-coverage at [email or web address], password [facility] 04/24/2015, 12:43 PM

## 2015-04-24 NOTE — Progress Notes (Signed)
Patient ID: [name], female   DOB: [birth year], 81 y.o.   MRN: [id number] Patient's imaging studies have been reviewed by Dr. Loreta AveWagner and she appears to be a suitable candidate for T6 vertebroplasty/kyphoplasty. Latest PT/INR is 29.9/2.9. Will need INR less than 1.6 to safely perform procedure. Will continue to monitor.

## 2015-04-25 ENCOUNTER — Observation Stay (HOSPITAL_COMMUNITY): Payer: Medicare Other

## 2015-04-25 ENCOUNTER — Encounter (HOSPITAL_COMMUNITY): Payer: Self-pay | Admitting: Radiology

## 2015-04-25 DIAGNOSIS — R0600 Dyspnea, unspecified: Secondary | ICD-10-CM

## 2015-04-25 DIAGNOSIS — J9621 Acute and chronic respiratory failure with hypoxia: Secondary | ICD-10-CM | POA: Diagnosis not present

## 2015-04-25 DIAGNOSIS — Z6831 Body mass index (BMI) 31.0-31.9, adult: Secondary | ICD-10-CM | POA: Diagnosis not present

## 2015-04-25 DIAGNOSIS — F05 Delirium due to known physiological condition: Secondary | ICD-10-CM | POA: Diagnosis not present

## 2015-04-25 DIAGNOSIS — J9620 Acute and chronic respiratory failure, unspecified whether with hypoxia or hypercapnia: Secondary | ICD-10-CM

## 2015-04-25 DIAGNOSIS — I480 Paroxysmal atrial fibrillation: Secondary | ICD-10-CM

## 2015-04-25 DIAGNOSIS — I5031 Acute diastolic (congestive) heart failure: Secondary | ICD-10-CM | POA: Diagnosis not present

## 2015-04-25 DIAGNOSIS — Z950 Presence of cardiac pacemaker: Secondary | ICD-10-CM | POA: Diagnosis not present

## 2015-04-25 DIAGNOSIS — J438 Other emphysema: Secondary | ICD-10-CM | POA: Diagnosis not present

## 2015-04-25 DIAGNOSIS — F419 Anxiety disorder, unspecified: Secondary | ICD-10-CM | POA: Diagnosis present

## 2015-04-25 DIAGNOSIS — W19XXXA Unspecified fall, initial encounter: Secondary | ICD-10-CM | POA: Diagnosis present

## 2015-04-25 DIAGNOSIS — E873 Alkalosis: Secondary | ICD-10-CM | POA: Diagnosis not present

## 2015-04-25 DIAGNOSIS — J9622 Acute and chronic respiratory failure with hypercapnia: Secondary | ICD-10-CM | POA: Diagnosis not present

## 2015-04-25 DIAGNOSIS — Z87891 Personal history of nicotine dependence: Secondary | ICD-10-CM | POA: Diagnosis not present

## 2015-04-25 DIAGNOSIS — Z880 Allergy status to penicillin: Secondary | ICD-10-CM | POA: Diagnosis not present

## 2015-04-25 DIAGNOSIS — Z515 Encounter for palliative care: Secondary | ICD-10-CM | POA: Diagnosis not present

## 2015-04-25 DIAGNOSIS — Z7189 Other specified counseling: Secondary | ICD-10-CM | POA: Diagnosis not present

## 2015-04-25 DIAGNOSIS — E872 Acidosis: Secondary | ICD-10-CM | POA: Diagnosis present

## 2015-04-25 DIAGNOSIS — Z881 Allergy status to other antibiotic agents status: Secondary | ICD-10-CM | POA: Diagnosis not present

## 2015-04-25 DIAGNOSIS — E876 Hypokalemia: Secondary | ICD-10-CM | POA: Diagnosis not present

## 2015-04-25 DIAGNOSIS — E669 Obesity, unspecified: Secondary | ICD-10-CM | POA: Diagnosis present

## 2015-04-25 DIAGNOSIS — T148 Other injury of unspecified body region: Secondary | ICD-10-CM | POA: Diagnosis not present

## 2015-04-25 DIAGNOSIS — I10 Essential (primary) hypertension: Secondary | ICD-10-CM | POA: Diagnosis present

## 2015-04-25 DIAGNOSIS — Z7901 Long term (current) use of anticoagulants: Secondary | ICD-10-CM | POA: Diagnosis not present

## 2015-04-25 DIAGNOSIS — R0789 Other chest pain: Secondary | ICD-10-CM

## 2015-04-25 DIAGNOSIS — Z8673 Personal history of transient ischemic attack (TIA), and cerebral infarction without residual deficits: Secondary | ICD-10-CM | POA: Diagnosis not present

## 2015-04-25 DIAGNOSIS — I5033 Acute on chronic diastolic (congestive) heart failure: Secondary | ICD-10-CM | POA: Diagnosis present

## 2015-04-25 DIAGNOSIS — J690 Pneumonitis due to inhalation of food and vomit: Secondary | ICD-10-CM | POA: Diagnosis present

## 2015-04-25 DIAGNOSIS — J441 Chronic obstructive pulmonary disease with (acute) exacerbation: Secondary | ICD-10-CM | POA: Diagnosis not present

## 2015-04-25 DIAGNOSIS — M4850XA Collapsed vertebra, not elsewhere classified, site unspecified, initial encounter for fracture: Secondary | ICD-10-CM | POA: Diagnosis present

## 2015-04-25 DIAGNOSIS — Z66 Do not resuscitate: Secondary | ICD-10-CM | POA: Diagnosis present

## 2015-04-25 DIAGNOSIS — R0902 Hypoxemia: Secondary | ICD-10-CM | POA: Diagnosis not present

## 2015-04-25 DIAGNOSIS — F329 Major depressive disorder, single episode, unspecified: Secondary | ICD-10-CM | POA: Diagnosis present

## 2015-04-25 DIAGNOSIS — M4854XA Collapsed vertebra, not elsewhere classified, thoracic region, initial encounter for fracture: Secondary | ICD-10-CM | POA: Diagnosis present

## 2015-04-25 DIAGNOSIS — E7800 Pure hypercholesterolemia, unspecified: Secondary | ICD-10-CM | POA: Diagnosis present

## 2015-04-25 DIAGNOSIS — Z882 Allergy status to sulfonamides status: Secondary | ICD-10-CM | POA: Diagnosis not present

## 2015-04-25 LAB — URINALYSIS, ROUTINE W REFLEX MICROSCOPIC
Bilirubin Urine: NEGATIVE
Glucose, UA: NEGATIVE mg/dL
Ketones, ur: NEGATIVE mg/dL
Nitrite: NEGATIVE
Protein, ur: NEGATIVE mg/dL
Specific Gravity, Urine: 1.025 (ref 1.005–1.030)
pH: 6 (ref 5.0–8.0)

## 2015-04-25 LAB — CBC
HCT: 38.8 % (ref 36.0–46.0)
Hemoglobin: 11.9 g/dL — ABNORMAL LOW (ref 12.0–15.0)
MCH: 26.4 pg (ref 26.0–34.0)
MCHC: 30.7 g/dL (ref 30.0–36.0)
MCV: 86.2 fL (ref 78.0–100.0)
PLATELETS: 392 10*3/uL (ref 150–400)
RBC: 4.5 MIL/uL (ref 3.87–5.11)
RDW: 15.3 % (ref 11.5–15.5)
WBC: 20.9 10*3/uL — AB (ref 4.0–10.5)

## 2015-04-25 LAB — URINE MICROSCOPIC-ADD ON

## 2015-04-25 LAB — BASIC METABOLIC PANEL
Anion gap: 8 (ref 5–15)
BUN: 24 mg/dL — ABNORMAL HIGH (ref 6–20)
CALCIUM: 8.9 mg/dL (ref 8.9–10.3)
CO2: 32 mmol/L (ref 22–32)
CREATININE: 0.83 mg/dL (ref 0.44–1.00)
Chloride: 101 mmol/L (ref 101–111)
GFR calc non Af Amer: >60 mL/min (ref >60)
Glucose, Bld: 124 mg/dL — ABNORMAL HIGH (ref 65–99)
Potassium: 3.7 mmol/L (ref 3.5–5.1)
SODIUM: 141 mmol/L (ref 135–145)

## 2015-04-25 LAB — PROTIME-INR
INR: 1.89 — ABNORMAL HIGH (ref 0.00–1.49)
PROTHROMBIN TIME: 21.6 s — AB (ref 11.6–15.2)

## 2015-04-25 MED ORDER — LEVALBUTEROL HCL 0.63 MG/3ML IN NEBU
0.6300 mg | INHALATION_SOLUTION | Freq: Four times a day (QID) | RESPIRATORY_TRACT | Status: DC | PRN
  Administered 2015-04-26 – 2015-04-28 (×2): 0.63 mg via RESPIRATORY_TRACT
  Filled 2015-04-25 (×2): qty 3

## 2015-04-25 MED ORDER — HALOPERIDOL LACTATE 5 MG/ML IJ SOLN
2.5000 mg | Freq: Four times a day (QID) | INTRAMUSCULAR | Status: DC | PRN
  Administered 2015-04-25 – 2015-04-28 (×9): 2.5 mg via INTRAVENOUS
  Administered 2015-04-29: 0.5 mg via INTRAVENOUS
  Filled 2015-04-25 (×10): qty 1

## 2015-04-25 MED ORDER — SODIUM CHLORIDE 0.9 % IV SOLN
INTRAVENOUS | Status: DC
Start: 2015-04-25 — End: 2015-04-25
  Administered 2015-04-25: 13:00:00 via INTRAVENOUS

## 2015-04-25 MED ORDER — PREDNISONE 20 MG PO TABS
60.0000 mg | ORAL_TABLET | Freq: Every day | ORAL | Status: DC
  Administered 2015-04-25 – 2015-04-26 (×2): 60 mg via ORAL
  Filled 2015-04-25 (×2): qty 3

## 2015-04-25 MED ORDER — FUROSEMIDE 10 MG/ML IJ SOLN
40.0000 mg | Freq: Every day | INTRAMUSCULAR | Status: DC
Start: 2015-04-26 — End: 2015-04-27
  Administered 2015-04-26 – 2015-04-27 (×2): 40 mg via INTRAVENOUS
  Filled 2015-04-25 (×2): qty 4

## 2015-04-25 MED ORDER — LEVALBUTEROL HCL 0.63 MG/3ML IN NEBU
0.6300 mg | INHALATION_SOLUTION | Freq: Three times a day (TID) | RESPIRATORY_TRACT | Status: DC
  Administered 2015-04-25 – 2015-05-02 (×20): 0.63 mg via RESPIRATORY_TRACT
  Filled 2015-04-25 (×20): qty 3

## 2015-04-25 MED ORDER — HYDRALAZINE HCL 20 MG/ML IJ SOLN: 5.0000 mg | INTRAMUSCULAR | Status: DC | PRN

## 2015-04-25 MED ORDER — HYDROMORPHONE HCL 1 MG/ML IJ SOLN
1.0000 mg | Freq: Once | INTRAMUSCULAR | Status: AC
  Administered 2015-04-25: 1 mg via INTRAVENOUS
  Filled 2015-04-25: qty 1

## 2015-04-25 MED ORDER — FUROSEMIDE 10 MG/ML IJ SOLN: 40.0000 mg | Freq: Once | INTRAMUSCULAR | Status: DC

## 2015-04-25 MED ORDER — LEVOFLOXACIN IN D5W 500 MG/100ML IV SOLN
500.0000 mg | INTRAVENOUS | Status: DC
  Administered 2015-04-25 – 2015-04-27 (×3): 500 mg via INTRAVENOUS
  Filled 2015-04-25 (×3): qty 100

## 2015-04-25 MED ORDER — SODIUM CHLORIDE 0.9 % IV SOLN: INTRAVENOUS | Status: DC

## 2015-04-25 NOTE — Progress Notes (Addendum)
Patient ID: [name], female   DOB: [birth year], 81 y.o.   MRN: [id number]   [facility] PROGRESS NOTE  [name] [id number] DOB: [birth year] DOA: 04/21/2015 PCP: Kaleen MaskELKINS,WILSON OLIVER, MD   Brief narrative:    81 y.o. Female, S/P cardiac pacemaker procedure, 02/03/13, Medtronic pacer, COPD, [facility] with main concern of 3 weeks duration of progressively worsening mid area back pain, constant and throbbing, 7/10 in severity and worse with movement, with no specific alleviating factors. CT scan showed T6 acute compression fracture with 40% body loss.  Assessment/Plan:    Principal Problem:   Acute on chronic respiratory failure (HCC) - secondary to COPD  - pt with stable respiratory status - was on solumedrol but was successfully transitioned to Prednisone, will continue tapering down  - continue BD's scheduled and as needed, brovana   Active Problems:   Leukocytosis - unclear etiology - steroids are being tapered down but WBC trending up, with T 99.2 F, UA suggestive of UTI - will place on empiric Levaquin for now - follow up on urine culture and CXR     Hospital induced delirium  - intermittent confusions - monitor     Accelerated HTN - SBP in 160's - on Lasix and Losartan - hold Lasix today as pt more dry on exam  - add hydralazine as needed for better BP control     PAF (paroxysmal atrial fibrillation) (HCC) - rate controlled  - Continue amiodarone.  - Anticoagulation on hold in anticipation of possible procedure. INR currently at 1.89    Chronic diastolic congestive heart failure (HCC) - compensated at this time - weight 81.5 kg - continue to monitor daily weights, strict I/O - was on Lasix but will hold today as pt more dry on exam - possibly resume in AM    Hypokalemia - supplemented and WNL this AM    S/P cardiac pacemaker procedure, 02/03/13, Medtronic pacer - stable     Chest pain - resolved     T6 compression fracture -  bone scan with increased activity over the midthoracic spine at T6 consistent with compression fracture  - plan for kyphoplasty once INR less than 1.6 if pt still hospitalized     Obesity  - Body mass index is 31.84 kg/(m^2).  DVT prophylaxis - SCD's  Code Status: Full.  Family Communication:  plan of care discussed with the patient Disposition Plan: SNF vs Home when urine culture back and WBC trending down   IV access:  Peripheral IV  Procedures and diagnostic studies:    Dg Chest 2 View 04/21/2015  Stable mild cardiomegaly without overt pulmonary edema. No active pulmonary disease.  Dg Thoracic Spine 2 View 04/23/2015 Severe compression deformity of T6 vertebral body is noted consistent with compression fracture of indeterminate age. This corresponds in location to abnormality seen on nuclear medicine study.  Dg Lumbar Spine Complete 04/12/2015 There is moderate multilevel degenerative disc and facet joint change. There is no acute compression fracture. If the patient's symptoms persist, lumbar spine MRI may be useful. 2. Increase colonic stool burden diffusely may reflect constipation in the appropriate clinical setting.   Nm Bone Scan Whole Body 04/23/2015   Increased activity noted over the mid thoracic spine at approximately T6. This could correspond to previously questioned T6 compression fracture.  Dg Chest Port 1 View 04/22/2015 No active disease.   Medical Consultants:  IR PCCM  Other Consultants:  PT  IAnti-Infectives:   Rocephin (pt allergic to  Rocephin), transition to Levaquin 11/23 -->  Debbora Presto, MD  [facility] Pager [phone]  If 7PM-7AM, please contact night-coverage [email or web address] Password [email or web address] 04/25/2015, 1:29 PM     HPI/Subjective: No events overnight. Discomfort in lower abd quadrants   Objective: Filed Vitals:   04/24/15 1909 04/24/15 2230 04/25/15 0432 04/25/15 0917  BP:  176/97 163/84   Pulse:  90 76   Temp:  99.2 F (37.3 C) 99.1  F (37.3 C)   TempSrc:  Oral Oral   Resp:  18 18   Height:      Weight:      SpO2: 98% 92% 90% 94%    Intake/Output Summary (Last 24 hours) at 04/25/15 1329 Last data filed at 04/25/15 0947  Gross per 24 hour  Intake      3 ml  Output    850 ml  Net   -847 ml    Exam:   General:  Pt is alert, not in acute distress, very dry MM and poor skin turgor   Cardiovascular: Regular rate and rhythm, no rubs, no gallops  Respiratory: Clear to auscultation bilaterally, no wheezing, diminished breath sounds at bases   Abdomen: Soft, tender in lower abd quadrants, non distended, bowel sounds present, no guarding   Data Reviewed: Basic Metabolic Panel:  Recent Labs Lab 04/21/15 1922 04/22/15 0522 04/23/15 0545 04/24/15 0809 04/25/15 0511  NA 142 147* 140 142 141  K 2.8* 3.7 4.1 4.2 3.7  CL 102 104 103 101 101  CO2 29 32 30 33* 32  GLUCOSE 117* 110* 156* 177* 124*  BUN 21* 24*  CREATININE 0.98 1.00 0.73 0.76 0.83  CALCIUM 8.7* 9.1 8.9 9.1 8.9  MG  --  2.2  --   --   --   PHOS  --  3.7  --   --   --    Liver Function Tests:  Recent Labs Lab 04/22/15 0522  AST 33  ALT 26  ALKPHOS 63  BILITOT 0.4  PROT 7.3  ALBUMIN 3.9   CBC:  Recent Labs Lab 04/21/15 1922 04/22/15 0522 04/23/15 0545 04/24/15 0809 04/25/15 0511  WBC 13.0* 14.5* 10.1 18.8* 20.9*  NEUTROABS 10.4*  --   --   --   --   HGB 12.4 12.3 11.7* 11.0* 11.9*  HCT 39.4 40.9 38.1 36.0 38.8  MCV 84.5 87.2 85.4 85.7 86.2  PLT 357 381 324 349 392   Cardiac Enzymes:  Recent Labs Lab 04/21/15 2247 04/22/15 0522 04/22/15 1042  TROPONINI <0.03 <0.03 <0.03   Recent Results (from the past 240 hour(s))  Culture, Urine     Status: None   Collection Time: 04/21/15  8:02 PM  Result Value Ref Range Status   Specimen Description URINE, CATHETERIZED  Final   Special Requests NONE  Final   Culture   Final    MULTIPLE SPECIES PRESENT, SUGGEST RECOLLECTION Performed at [facility]     Report Status 04/23/2015 FINAL  Final    Scheduled Meds: . acetaminophen  500 mg Oral TID  . amiodarone  200 mg Oral q morning - 10a  . arformoterol  15 mcg Nebulization BID  . PULMICORT nebulizer   0.25 mg Nebulization BID  . calcitonin (salmon)  1 spray Alternating Nares QPM  . docusate sodium  100 mg Oral BID  . fentaNYL  25 mcg Transdermal Q72H  . FLUoxetine  20 mg Oral q morning - 10a  . furosemide  20 mg Oral Daily  .  ipratropium  0.5 mg Nebulization TID  . levalbuterol  0.63 mg Nebulization Q6H WA  . levofloxacin  IV  500 mg Intravenous Q24H  . losartan  50 mg Oral q morning - 10a  . mirabegron ER  50 mg Oral QPM  . mirtazapine  15 mg Oral QHS  . polyethylene glycol  17 g Oral Daily  . PredniSONE  60 mg Oral QAC breakfast  . senna-docusate  1 tablet Oral QHS   Continuous Infusions: . sodium chloride 75 mL/hr at 04/25/15 1231

## 2015-04-25 NOTE — Progress Notes (Signed)
OT Cancellation Note  Patient Details Name: [name] MRN: [id number] DOB: 08/01/1931   Cancelled Treatment:    Reason Eval/Treat Not Completed: Other (comment). Noted plan is for KP.  Will try to check back after this.    Laranda Burkemper 04/25/2015, 1:25 PM  Marica OtterMaryellen Jaquavion Mccannon, OTR/L [phone] 04/25/2015

## 2015-04-25 NOTE — Progress Notes (Signed)
Pt has been restless and agitated. Pt c/o pain in left side. Pain medication given as ordered with some relief. She has been calling out for husband and has been re oriented to her environment. Pt still remains uncomfortable. She is currently yelling out that she is having a baby and she pulled out her foley. Schorr notified and haldol 2.5mg  ordered.

## 2015-04-25 NOTE — Progress Notes (Signed)
PT Cancellation Note  Patient Details Name: Regina OverlieShirley A [name] MRN: [id number] DOB: 03-18-1932   Cancelled Treatment:    Reason Eval/Treat Not Completed: Medical issues which prohibited therapy Pt restless this morning and RN reports just medicated for pain.  RN states plan is for KP possibly on Friday.  Pt would likely best benefit from evaluation after surgery, so will likely check back later this week.   Deaglan Lile,KATHrine E 04/25/2015, 10:39 AM Zenovia JarredKati Aalijah Mims, PT, DPT 04/25/2015 Pager: [phone]

## 2015-04-25 NOTE — H&P (Signed)
Chief Complaint: Patient was seen in consultation today for acute/subacute thoracic level 6 compression fracture- uncontrolled back pain Chief Complaint  Patient presents with  . Back Pain   at the request of Dr. Janee Morn  Referring Physician(s): Dr. Janee Morn   History of Present Illness: [name] is a 81 y.o. female who presented after complaints of worsening mid back pain-no known injury and was seen by her PCP with CT imaging that revealed T6 compression fracture. NM scan with increased activity at T6 level. Patient with continued uncontrolled back pain despite maximum pain medication and IR received request for consult for Thoracic level 6 Vertebroplasty/Kyphoplasty. History is obtained per patient's son and chart review as patient is confused today. Her son states no known loss of bladder or bowel function, no known injury. The patient resides at home with her husband and this back pain is new x 2-3 weeks. No known complications to sedation. She denies any chest pain today and does complain of back pain.    Past Medical History  Diagnosis Date  . Irregular heart beat   . Hypertension   . Hypercholesteremia   . Depression   . Atrial fibrillation (HCC)   . Bowel obstruction (HCC)   . S/P cardiac pacemaker procedure, 02/03/13, Medtronic pacer 02/04/2013    sick sinus syndrome  . CVA (cerebral infarction)   . COPD (chronic obstructive pulmonary disease) (HCC)   . Tobacco abuse     quit 2009  . History of echocardiogram 05/21/2012    TEE; EF 55-60%; mild grade 1 atherosclerosis of prox descending aorta & distal aortic arch  . History of nuclear stress test 11/07/2009    dipyridamole; normal pattern of perfusion; negative for ischemia; low risk   . Carotid artery disease (HCC)     carotid duplex 10/2012 - bilat ICAs normla patency with mod tortuosity of vessel, L vertebral artery demonstrated occlsuve disease    Past Surgical History  Procedure Laterality Date  . Tee  without cardioversion  05/21/2012    Procedure: TRANSESOPHAGEAL ECHOCARDIOGRAM (TEE);  Surgeon: Chrystie Nose, MD;  Location: [facility] ENDOSCOPY;  Service: Cardiovascular;  Laterality: N/A;  . Cardioversion  05/21/2012    Procedure: CARDIOVERSION;  Surgeon: Chrystie Nose, MD;  Location[facility] ENDOSCOPY;  Service: Cardiovascular;  Laterality: N/A;  . Abdominal surgery    . Tonsillectomy    . Appendectomy    . Abdominal hysterectomy    . Colporrhaphy      posterior  . Pacemaker insertion  07/07/2012    medtronic  . Permanent pacemaker insertion Left 02/03/2013    Procedure: PERMANENT PACEMAKER INSERTION;  Surgeon: Thurmon Fair, MD;  Location: [facility] CATH LAB;  Service: Cardiovascular;  Laterality: Left;    Allergies: Amoxicillin; Doxycycline; Penicillins; and Sulfa antibiotics  Medications: Prior to Admission medications   Medication Sig Start Date End Date Taking? Authorizing Provider  acetaminophen (TYLENOL) 500 MG tablet Take 500 mg by mouth at bedtime as needed for moderate pain. For pain to help sleep   Yes Historical Provider, MD  amiodarone (PACERONE) 200 MG tablet Take 200 mg by mouth every morning.    Yes Historical Provider, MD  beta carotene w/minerals (OCUVITE) tablet Take 1 tablet by mouth every morning.    Yes Historical Provider, MD  calcitonin, salmon, (MIACALCIN/FORTICAL) 200 UNIT/ACT nasal spray Place 1 spray into alternate nostrils every evening.   Yes Historical Provider, MD  calcium-vitamin D (OSCAL WITH D) 500-200 MG-UNIT per tablet Take 1 tablet by mouth  daily with breakfast.    Yes Historical Provider, MD  FLUoxetine (PROZAC) 20 MG capsule Take 20 mg by mouth every morning.    Yes Historical Provider, MD  furosemide (LASIX) 40 MG tablet TAKE 1 AND 1/2 TABLET BY MOUTH ONCE DAILY 02/07/15  Yes Mihai Croitoru, MD  HYDROcodone-acetaminophen (NORCO/VICODIN) 5-325 MG tablet Take 1 tablet by mouth every 8 (eight) hours.   Yes Historical Provider, MD  ipratropium-albuterol (DUONEB)  0.5-2.5 (3) MG/3ML SOLN Take 3 mLs by nebulization every 6 (six) hours as needed. 01/26/14  Yes Waymon Budgelinton D Young, MD  losartan (COZAAR) 50 MG tablet Take 50 mg by mouth every morning.    Yes Historical Provider, MD  mirabegron ER (MYRBETRIQ) 50 MG TB24 tablet Take 50 mg by mouth every evening.    Yes Historical Provider, MD  mirtazapine (REMERON) 15 MG tablet Take 15 mg by mouth at bedtime.   Yes Historical Provider, MD  warfarin (COUMADIN) 5 MG tablet Take 2.5 mg by mouth daily at 6 PM.    Yes Historical Provider, MD     Family History  Problem Relation Age of Onset  . Coronary artery disease Father   . Heart attack Brother   . Stomach cancer Paternal Grandmother     Social History   Social History  . Marital Status: Married    Spouse Name: N/A  . Number of Children: 1  . Years of Education: N/A   Occupational History  . retired    Social History Main Topics  . Smoking status: Former Smoker -- 1.00 packs/day for 38 years    Types: Cigarettes    Start date: 06/02/1969    Quit date: 06/03/2007  . Smokeless tobacco: Never Used  . Alcohol Use: No  . Drug Use: No  . Sexual Activity: Not Asked   Other Topics Concern  . 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  All other systems are negative.  Review of Systems  Vital Signs: BP 163/84 mmHg  Pulse 76  Temp(Src) 99.1 F (37.3 C) (Oral)  Resp 18  Ht 5\' 3"  (1.6 m)  Wt 179 lb 10.8 oz (81.5 kg)  BMI 31.84 kg/m2  SpO2 94%  Physical Exam  Constitutional:  Appears in pain  HENT:  Head: Normocephalic and atraumatic.  Cardiovascular: Normal rate and regular rhythm.  Exam reveals no gallop and no friction rub.   No murmur heard. Pulmonary/Chest: Effort normal. She has wheezes.  Abdominal: Soft. Bowel sounds are normal. She exhibits no distension. There is no tenderness.    Musculoskeletal:  TTP in T6 region, no skin changes  Neurological:  Oriented to self not location, states she is at home today  Skin: She is not diaphoretic.    Mallampati Score:  MD Evaluation Airway: WNL Heart: WNL Abdomen: WNL Chest/ Lungs: WNL ASA  Classification: 3 Mallampati/Airway Score: Two  Imaging: Dg Chest 2 View  04/21/2015  CLINICAL DATA:  Left chest and back pain EXAM: CHEST  2 VIEW COMPARISON:  03/24/2014 chest radiograph. FINDINGS: Two-vessel left subclavian pacemaker is stable with lead tips overlying the right atrium and right ventricle. Stable cardiomediastinal silhouette with mild cardiomegaly. No pneumothorax. No pleural effusion. Cephalization of the pulmonary vasculature, without overt pulmonary edema. No focal lung consolidation. IMPRESSION: Stable mild cardiomegaly without overt pulmonary edema. No active pulmonary disease. Electronically Signed  By: Delbert Phenix M.D.   On: 04/21/2015 19:19   Dg Thoracic Spine 2 View  04/23/2015  CLINICAL DATA:  Compression fracture. EXAM: THORACIC SPINE 2 VIEWS COMPARISON:  Chest radiograph of April 21, 2015. Nuclear medicine study of April 23, 2015. FINDINGS: Severe wedge compression deformity of T6 vertebral body is noted consistent with compression fracture of indeterminate age. No spondylolisthesis is noted. IMPRESSION: Severe compression deformity of T6 vertebral body is noted consistent with compression fracture of indeterminate age. This corresponds in location to abnormality seen on nuclear medicine study. Electronically Signed   By: Lupita Raider, M.D.   On: 04/23/2015 14:11   Dg Lumbar Spine Complete  04/12/2015  CLINICAL DATA:  Sharp low back pains since last night without known injury, no radicular symptoms. EXAM: LUMBAR SPINE - COMPLETE 4+ VIEW COMPARISON:  Lumbar spine series of January 23, 2009 FINDINGS: There is stable mild levo curvature centered at the L1-2 disc level. The lumbar vertebral bodies are  preserved in height. There is moderate multilevel degenerative disc disease with anterior endplate osteophytes. There are prominent lateral endplate osteophytes at L1-2 and at L2-3. There is no spondylolisthesis. There is mild facet joint hypertrophy especially at L5-S1. The pedicles and transverse processes are intact. The observed portions of the sacrum are normal. The stool burden throughout the colon is increased. IMPRESSION: 1. There is moderate multilevel degenerative disc and facet joint change. There is no acute compression fracture. If the patient's symptoms persist, lumbar spine MRI may be useful. 2. Increase colonic stool burden diffusely may reflect constipation in the appropriate clinical setting. Electronically Signed   By: David  [place] M.D.   On: 04/12/2015 09:59   Nm Bone Scan Whole Body  04/23/2015  CLINICAL DATA:  Intractable pain. Possible T6 compression fracture . EXAM: NUCLEAR MEDICINE WHOLE BODY BONE SCAN TECHNIQUE: Whole body anterior and posterior images were obtained approximately 3 hours after intravenous injection of radiopharmaceutical. RADIOPHARMACEUTICALS:  25.6 mCi Technetium-38m MDP IV COMPARISON:  Thoracic spine series 11 06/21/2014. FINDINGS: Bilateral renal function excretion. Increased activity noted over the mid thoracic spine at approximately T6. No other focal abnormalities identified. IMPRESSION: Increased activity noted over the mid thoracic spine at approximately T6. This could correspond to previously questioned T6 compression fracture. Electronically Signed   By: Maisie Fus  Register   On: 04/23/2015 13:32   Dg Chest Port 1 View  04/22/2015  CLINICAL DATA:  Respiratory distress EXAM: PORTABLE CHEST 1 VIEW COMPARISON:  04/21/2015 FINDINGS: Cardiac enlargement. Dual lead pacemaker in good position and unchanged. Negative for heart failure Lungs are clear without infiltrate or effusion. IMPRESSION: No active disease. Electronically Signed   By: Marlan Palau M.D.   On:  04/22/2015 14:52   Ct Outside Films Spine  04/23/2015  CLINICAL DATA:  This exam is stored here for comparison purposes only and was performed at an outside facility.   Please contact the originating institution for any associated interpretation or report.    Labs:  CBC:  Recent Labs  04/22/15 0522 04/23/15 0545 04/24/15 0809 04/25/15 0511  WBC 14.5* 10.1 18.8* 20.9*  HGB 12.3 11.7* 11.0* 11.9*  HCT 40.9 38.1 36.0 38.8  PLT 381 324 349 392    COAGS:  Recent Labs  04/23/15 0545 04/24/15 0809 04/24/15 1900 04/25/15 0511  INR 2.53* 2.90* 2.55* 1.89*    BMP:  Recent Labs  04/22/15 0522 04/23/15 0545 04/24/15 0809 04/25/15 0511  NA 147* 140 142 141  K 3.7 4.1 4.2 3.7  CL  104 103 101 101  CO2 32 30 33* 32  GLUCOSE 110* 156* 177* 124*  BUN 17 18 21* 24*  CALCIUM 9.1 8.9 9.1 8.9  CREATININE 1.00 0.73 0.76 0.83  GFRNONAA 51* >60 >60 >60  GFRAA 60* >60 >60 >60    LIVER FUNCTION TESTS:  Recent Labs  03/22/15 1442 04/22/15 0522  BILITOT 0.5 0.4  AST 39* 33  ALT 56* 26  ALKPHOS 59 63  PROT 7.1 7.3  ALBUMIN 4.2 3.9    Assessment and Plan: Acute/subacute thoracic level 6 compression fracture- uncontrolled back pain with pain medication Request for IR consult for Thoracic level 6 Vertebroplasty/Kyphoplasty with sedation Outside CT scan and NM scan reviewed (unable to have MRI secondary to pacemaker) by Dr. Archer Asa and IR to perform T6 VP/KP Friday 11/25 pending INR level and must be afebrile with wbc trending down  The patient will need to be NPO, blood thinners held, labs and vitals have been reviewed. Risks and Benefits discussed with the patient and her son [name] today including, but not limited to education regarding the natural healing process of compression fractures without intervention, bleeding, infection, cement migration which may cause spinal cord damage, paralysis, pulmonary embolism or even death. All questions were answered, patient's son  is agreeable to proceed. Consent given by son over the phone and is in IR. PAF- on amiodarone and coumadin, being held INR 1.8 today Leukocytosis recent IV steroids-now on oral prednisone, now on IV Levaquin (started 11/23) for possible UTI COPD Confusion- per son worse with pain medications- baseline she is A&Ox3    Thank you for this interesting consult.  I greatly enjoyed meeting YEHUDIT FULGINITI and look forward to participating in their care.  A copy of this report was sent to the requesting provider on this date.  SignedBerneta Levins 04/25/2015, 2:43 PM   I spent a total of 40 Minutes in face to face in clinical consultation, greater than 50% of which was counseling/coordinating care for acute/subacute thoracic level 6 compression fracture- uncontrolled back pain

## 2015-04-25 NOTE — Plan of Care (Signed)
Problem: Education: Goal: Knowledge of [facility] information/materials will improve Outcome: Not Progressing Pt instructed on plan of care and medications being administered but there is no evidence to acknowledge her understanding of teaching

## 2015-04-26 DIAGNOSIS — I5031 Acute diastolic (congestive) heart failure: Secondary | ICD-10-CM

## 2015-04-26 DIAGNOSIS — J9621 Acute and chronic respiratory failure with hypoxia: Secondary | ICD-10-CM

## 2015-04-26 LAB — BASIC METABOLIC PANEL
ANION GAP: 10 (ref 5–15)
BUN: 21 mg/dL — ABNORMAL HIGH (ref 6–20)
CO2: 30 mmol/L (ref 22–32)
Calcium: 8.7 mg/dL — ABNORMAL LOW (ref 8.9–10.3)
Chloride: 101 mmol/L (ref 101–111)
Creatinine, Ser: 0.73 mg/dL (ref 0.44–1.00)
GFR calc Af Amer: >60 mL/min (ref >60)
GLUCOSE: 159 mg/dL — AB (ref 65–99)
POTASSIUM: 4 mmol/L (ref 3.5–5.1)
Sodium: 141 mmol/L (ref 135–145)

## 2015-04-26 LAB — CBC
HEMATOCRIT: 40.2 % (ref 36.0–46.0)
HEMOGLOBIN: 12.7 g/dL (ref 12.0–15.0)
MCH: 26.6 pg (ref 26.0–34.0)
MCHC: 31.6 g/dL (ref 30.0–36.0)
MCV: 84.1 fL (ref 78.0–100.0)
PLATELETS: 364 10*3/uL (ref 150–400)
RBC: 4.78 MIL/uL (ref 3.87–5.11)
RDW: 15.2 % (ref 11.5–15.5)
WBC: 15.1 10*3/uL — AB (ref 4.0–10.5)

## 2015-04-26 LAB — PROTIME-INR
INR: 1.32 (ref 0.00–1.49)
Prothrombin Time: 16.5 seconds — ABNORMAL HIGH (ref 11.6–15.2)

## 2015-04-26 LAB — URINE CULTURE
Culture: NO GROWTH
Special Requests: NORMAL

## 2015-04-26 MED ORDER — PREDNISONE 50 MG PO TABS
50.0000 mg | ORAL_TABLET | Freq: Every day | ORAL | Status: DC
  Administered 2015-04-27: 50 mg via ORAL
  Filled 2015-04-26: qty 1

## 2015-04-26 NOTE — Progress Notes (Signed)
Patient ID: [name], female   DOB: 1932-04-18, 81 y.o.   MRN: [id number]   [facility] PROGRESS NOTE  SOO STEELMAN WUJ:[id number] DOB: 06-18-1931 DOA: 04/21/2015 PCP: [name], MD   Brief narrative:    81 y.o. Female, S/P cardiac pacemaker procedure, 02/03/13, Medtronic pacer, COPD, presented to [facility] ED with main concern of 3 weeks duration of progressively worsening mid area back pain, constant and throbbing, 7/10 in severity and worse with movement, with no specific alleviating factors. CT scan showed T6 acute compression fracture with 40% body loss.  Assessment/Plan:    Principal Problem:   Acute on chronic respiratory failure (HCC) - secondary to COPD  - pt with stable respiratory status - transitioned to Prednisone, will continue tapering down  - continue BD's scheduled and as needed, brovana   Active Problems:   Leukocytosis - unclear etiology - steroids are being tapered down and WBL trending down  - continue Levaquin for now - follow up on urine culture     Hospital induced delirium  - intermittent confusions - more clear this AM  - monitor     Accelerated HTN - SBP in 160's - on Lasix and Losartan - add hydralazine as needed for better BP control     PAF (paroxysmal atrial fibrillation) (HCC) - rate controlled  - Continue amiodarone.  - Anticoagulation on hold in anticipation of possible procedure. INR currently at 1.89    Chronic diastolic congestive heart failure (HCC) - compensated at this time - weight 81.5 kg - continue to monitor daily weights, strict I/O - continue Lasix     Hypokalemia - supplemented and WNL this AM    S/P cardiac pacemaker procedure, 02/03/13, Medtronic pacer - stable     Chest pain - resolved     T6 compression fracture - bone scan with increased activity over the midthoracic spine at T6 consistent with compression fracture  - plan for kyphoplasty once INR less than 1.6 if pt still hospitalized      Obesity  - Body mass index is 31.84 kg/(m^2).  DVT prophylaxis - SCD's  Code Status: Full.  Family Communication:  plan of care discussed with the patient Disposition Plan: SNF vs Home when urine culture back and WBC trending down   IV access:  Peripheral IV  Procedures and diagnostic studies:    Dg Chest 2 View 04/21/2015  Stable mild cardiomegaly without overt pulmonary edema. No active pulmonary disease.  Dg Thoracic Spine 2 View 04/23/2015 Severe compression deformity of T6 vertebral body is noted consistent with compression fracture of indeterminate age. This corresponds in location to abnormality seen on nuclear medicine study.  Dg Lumbar Spine Complete 04/12/2015 There is moderate multilevel degenerative disc and facet joint change. There is no acute compression fracture. If the patient's symptoms persist, lumbar spine MRI may be useful. 2. Increase colonic stool burden diffusely may reflect constipation in the appropriate clinical setting.   Nm Bone Scan Whole Body 04/23/2015   Increased activity noted over the mid thoracic spine at approximately T6. This could correspond to previously questioned T6 compression fracture.  Dg Chest Port 1 View 04/22/2015 No active disease.   Medical Consultants:  IR PCCM  Other Consultants:  PT  IAnti-Infectives:   Rocephin (pt allergic to Rocephin), transition to Levaquin 11/23 -->  Debbora Presto, MD  [facility] Pager [phone]  If 7PM-7AM, please contact night-coverage [email or web address] Password [email or web address] 04/26/2015, 1:44 PM   LOS: 1 day   HPI/Subjective: No events overnight. Discomfort  in lower abd quadrants   Objective: Filed Vitals:   04/25/15 2034 04/25/15 2128 04/26/15 0559 04/26/15 0808  BP:  134/65 135/55   Pulse:  72 63   Temp:  98.2 F (36.8 C) 97.5 F (36.4 C)   TempSrc:  Oral Axillary   Resp:  20 18   Height:      Weight:      SpO2: 96% 92% 90% 92%    Intake/Output Summary (Last 24 hours) at 04/26/15  1344 Last data filed at 04/26/15 1002  Gross per 24 hour  Intake    740 ml  Output      0 ml  Net    740 ml    Exam:   General:  Pt is alert, not in acute distress, very dry MM and poor skin turgor   Cardiovascular: Regular rate and rhythm, no rubs, no gallops  Respiratory: Clear to auscultation bilaterally, no wheezing, diminished breath sounds at bases   Abdomen: Soft, tender in lower abd quadrants, non distended   Data Reviewed: Basic Metabolic Panel:  Recent Labs Lab 04/22/15 0522 04/23/15 0545 04/24/15 0809 04/25/15 0511 04/26/15 0531  NA 147* 140 142 141 141  K 3.7 4.1 4.2 3.7 4.0  CL 104 103 101 101 101  CO2 32 30 33* 32 30  GLUCOSE 110* 156* 177* 124* 159*  BUN 17 18 21* 24* 21*  CREATININE 1.00 0.73 0.76 0.83 0.73  CALCIUM 9.1 8.9 9.1 8.9 8.7*  MG 2.2  --   --   --   --   PHOS 3.7  --   --   --   --    Liver Function Tests:  Recent Labs Lab 04/22/15 0522  AST 33  ALT 26  ALKPHOS 63  BILITOT 0.4  PROT 7.3  ALBUMIN 3.9   CBC:  Recent Labs Lab 04/21/15 1922 04/22/15 0522 04/23/15 0545 04/24/15 0809 04/25/15 0511 04/26/15 0531  WBC 13.0* 14.5* 10.1 18.8* 20.9* 15.1*  NEUTROABS 10.4*  --   --   --   --   --   HGB 12.4 12.3 11.7* 11.0* 11.9* 12.7  HCT 39.4 40.9 38.1 36.0 38.8 40.2  MCV 84.5 87.2 85.4 85.7 86.2 84.1  PLT 357 381 324 349 392 364   Cardiac Enzymes:  Recent Labs Lab 04/21/15 2247 04/22/15 0522 04/22/15 1042  TROPONINI <0.03 <0.03 <0.03   Recent Results (from the past 240 hour(s))  Culture, Urine     Status: None   Collection Time: 04/21/15  8:02 PM  Result Value Ref Range Status   Specimen Description URINE, CATHETERIZED  Final   Special Requests NONE  Final   Culture   Final    MULTIPLE SPECIES PRESENT, SUGGEST RECOLLECTION [facility]    Report Status 04/23/2015 FINAL  Final  Urine culture     Status: None   Collection Time: 04/25/15 12:39 AM  Result Value Ref Range Status   Specimen  Description URINE, CATHETERIZED  Final   Special Requests Normal  Final   Culture   Final    NO GROWTH 1 DAY Performed at [facility]    Report Status 04/26/2015 FINAL  Final    Scheduled Meds: . acetaminophen  500 mg Oral TID  . amiodarone  200 mg Oral q morning - 10a  . arformoterol  15 mcg Nebulization BID  . PULMICORT nebulizer   0.25 mg Nebulization BID  . calcitonin (salmon)  1 spray Alternating Nares QPM  .  docusate sodium  100 mg Oral BID  . fentaNYL  25 mcg Transdermal Q72H  . FLUoxetine  20 mg Oral q morning - 10a  . furosemide  20 mg Oral Daily  . ipratropium  0.5 mg Nebulization TID  . levalbuterol  0.63 mg Nebulization Q6H WA  . levofloxacin  IV  500 mg Intravenous Q24H  . losartan  50 mg Oral q morning - 10a  . mirabegron ER  50 mg Oral QPM  . mirtazapine  15 mg Oral QHS  . polyethylene glycol  17 g Oral Daily  . PredniSONE  60 mg Oral QAC breakfast  . senna-docusate  1 tablet Oral QHS   Continuous Infusions:

## 2015-04-26 NOTE — Progress Notes (Signed)
Chart Check  INR 1.32 Afebrile  Dr. Archer AsaMcCullough to perform T6 VP/KP Friday 11/25 NPO after MN ordered Blood thinners to be held (ordered)  Regina MacleodWENDY S Prabhav Faulkenberry PA-C 04/26/2015 9:37 AM

## 2015-04-27 ENCOUNTER — Inpatient Hospital Stay (HOSPITAL_COMMUNITY): Payer: Medicare Other

## 2015-04-27 ENCOUNTER — Encounter (HOSPITAL_COMMUNITY): Payer: Self-pay | Admitting: Radiology

## 2015-04-27 DIAGNOSIS — J438 Other emphysema: Secondary | ICD-10-CM

## 2015-04-27 LAB — BASIC METABOLIC PANEL
Anion gap: 10 (ref 5–15)
BUN: 20 mg/dL (ref 6–20)
CHLORIDE: 98 mmol/L — AB (ref 101–111)
CO2: 31 mmol/L (ref 22–32)
CREATININE: 0.75 mg/dL (ref 0.44–1.00)
Calcium: 8.8 mg/dL — ABNORMAL LOW (ref 8.9–10.3)
GFR calc non Af Amer: >60 mL/min (ref >60)
Glucose, Bld: 125 mg/dL — ABNORMAL HIGH (ref 65–99)
Potassium: 3.8 mmol/L (ref 3.5–5.1)
Sodium: 139 mmol/L (ref 135–145)

## 2015-04-27 LAB — CBC WITH DIFFERENTIAL/PLATELET
BASOS PCT: 0 %
Basophils Absolute: 0 10*3/uL (ref 0.0–0.1)
Eosinophils Absolute: 0 10*3/uL (ref 0.0–0.7)
Eosinophils Relative: 0 %
HEMATOCRIT: 39.8 % (ref 36.0–46.0)
HEMOGLOBIN: 12.5 g/dL (ref 12.0–15.0)
LYMPHS ABS: 1.3 10*3/uL (ref 0.7–4.0)
LYMPHS PCT: 6 %
MCH: 26.5 pg (ref 26.0–34.0)
MCHC: 31.4 g/dL (ref 30.0–36.0)
MCV: 84.3 fL (ref 78.0–100.0)
MONOS PCT: 10 %
Monocytes Absolute: 2 10*3/uL — ABNORMAL HIGH (ref 0.1–1.0)
NEUTROS ABS: 16.6 10*3/uL — AB (ref 1.7–7.7)
NEUTROS PCT: 84 %
Platelets: 374 10*3/uL (ref 150–400)
RBC: 4.72 MIL/uL (ref 3.87–5.11)
RDW: 15.6 % — ABNORMAL HIGH (ref 11.5–15.5)
WBC: 19.8 10*3/uL — ABNORMAL HIGH (ref 4.0–10.5)

## 2015-04-27 LAB — BLOOD GAS, ARTERIAL
ACID-BASE EXCESS: 6.2 mmol/L — AB (ref 0.0–2.0)
Bicarbonate: 30 mEq/L — ABNORMAL HIGH (ref 20.0–24.0)
Drawn by: 257701
FIO2: 0.5
O2 SAT: 91.4 %
PATIENT TEMPERATURE: 98.6
PCO2 ART: 41.2 mmHg (ref 35.0–45.0)
TCO2: 26.6 mmol/L (ref 0–100)
pH, Arterial: 7.475 — ABNORMAL HIGH (ref 7.350–7.450)
pO2, Arterial: 62.7 mmHg — ABNORMAL LOW (ref 80.0–100.0)

## 2015-04-27 LAB — PROTIME-INR
INR: 1.22 (ref 0.00–1.49)
Prothrombin Time: 15.6 seconds — ABNORMAL HIGH (ref 11.6–15.2)

## 2015-04-27 MED ORDER — FUROSEMIDE 10 MG/ML IJ SOLN
40.0000 mg | Freq: Every day | INTRAMUSCULAR | Status: DC
  Administered 2015-04-28 – 2015-05-02 (×5): 40 mg via INTRAVENOUS
  Filled 2015-04-27 (×5): qty 4

## 2015-04-27 MED ORDER — CLINDAMYCIN PHOSPHATE 600 MG/50ML IV SOLN
600.0000 mg | Freq: Three times a day (TID) | INTRAVENOUS | Status: DC
  Administered 2015-04-27 – 2015-04-29 (×5): 600 mg via INTRAVENOUS
  Filled 2015-04-27 (×5): qty 50

## 2015-04-27 MED ORDER — PREDNISONE 20 MG PO TABS
40.0000 mg | ORAL_TABLET | Freq: Every day | ORAL | Status: DC
  Administered 2015-04-28: 40 mg via ORAL
  Filled 2015-04-27: qty 2

## 2015-04-27 MED ORDER — CLINDAMYCIN PHOSPHATE 900 MG/50ML IV SOLN: 900.0000 mg | INTRAVENOUS | Status: DC

## 2015-04-27 MED ORDER — HEPARIN (PORCINE) IN NACL 100-0.45 UNIT/ML-% IJ SOLN
1300.0000 [IU]/h | INTRAMUSCULAR | Status: DC
  Administered 2015-04-27: 1050 [IU]/h via INTRAVENOUS
  Administered 2015-04-28 – 2015-05-02 (×5): 1300 [IU]/h via INTRAVENOUS
  Filled 2015-04-27 (×6): qty 250

## 2015-04-27 MED ORDER — FUROSEMIDE 10 MG/ML IJ SOLN: 40.0000 mg | Freq: Two times a day (BID) | INTRAMUSCULAR | Status: DC

## 2015-04-27 MED ORDER — VANCOMYCIN HCL IN DEXTROSE 1-5 GM/200ML-% IV SOLN: 1000.0000 mg | INTRAVENOUS | Status: DC

## 2015-04-27 MED ORDER — CLINDAMYCIN PHOSPHATE 600 MG/50ML IV SOLN: 600.0000 mg | Freq: Once | INTRAVENOUS | Status: DC

## 2015-04-27 MED ORDER — CLINDAMYCIN PHOSPHATE 900 MG/50ML IV SOLN
900.0000 mg | INTRAVENOUS | Status: DC
  Filled 2015-04-27: qty 50

## 2015-04-27 NOTE — Progress Notes (Addendum)
Patient ID: [name], female   DOB: 1931/10/03, 81 y.o.   MRN: [id number]   [facility] PROGRESS NOTE  [name] [id number] DOB: 08/24/1931 DOA: 04/21/2015 PCP: [name], MD   Brief narrative:    81 y.o. Female, S/P cardiac pacemaker procedure, 02/03/13, Medtronic pacer, COPD, presented to [facility] ED with main concern of 3 weeks duration of progressively worsening mid area back pain, constant and throbbing, 7/10 in severity and worse with movement, with no specific alleviating factors. CT scan showed T6 acute compression fracture with 40% body loss.  Assessment/Plan:    Principal Problem:   Acute on chronic respiratory failure (HCC) with hypoxia  - secondary to COPD and with worsening hypoxia 11/25, CT chest obtained, was worrisome for aspiration PNA< will start Clindamycin 11/25 - onPrednisone, will continue tapering down  - continue BD's scheduled and as needed, brovana   Active Problems:   Leukocytosis - still elevated WBC, CT chest with ? Aspiration PNA - steroids are being tapered down - placed on Clindamycin and stopped Levaquin     Hospital induced delirium  - intermittent confusions - more confused this AM  - monitor     Accelerated HTN - SBP in 160's - on Lasix and Losartan - continue hydralazine as needed for better BP control     PAF (paroxysmal atrial fibrillation) (HCC) - rate controlled  - Continue amiodarone.  - AC on hold in anticipation of possible procedure but procedure cancelled so will resume AC    Chronic diastolic congestive heart failure (HCC) - mild crackles on exam  - weight 81.5 kg - continue to monitor daily weights, strict I/O - continue Lasix     Hypokalemia - supplemented and WNL this AM    S/P cardiac pacemaker procedure, 02/03/13, Medtronic pacer - stable     Chest pain - resolved     T6 compression fracture - bone scan with increased activity over the midthoracic spine at T6 consistent with compression  fracture  - plan for kyphoplasty once hypoxia resolved     Obesity  - Body mass index is 31.84 kg/(m^2).  DVT prophylaxis - SCD's  Code Status: Full.  Family Communication:  plan of care discussed with the patient Disposition Plan: SNF when hypoxia and mental status improves   IV access:  Peripheral IV  Procedures and diagnostic studies:    Dg Chest 2 View 04/21/2015  Stable mild cardiomegaly without overt pulmonary edema. No active pulmonary disease.  Dg Thoracic Spine 2 View 04/23/2015 Severe compression deformity of T6 vertebral body is noted consistent with compression fracture of indeterminate age. This corresponds in location to abnormality seen on nuclear medicine study.  Dg Lumbar Spine Complete 04/12/2015 There is moderate multilevel degenerative disc and facet joint change. There is no acute compression fracture. If the patient's symptoms persist, lumbar spine MRI may be useful. 2. Increase colonic stool burden diffusely may reflect constipation in the appropriate clinical setting.   Nm Bone Scan Whole Body 04/23/2015   Increased activity noted over the mid thoracic spine at approximately T6. This could correspond to previously questioned T6 compression fracture.  Dg Chest Port 1 View 04/22/2015 No active disease.   Medical Consultants:  IR PCCM  Other Consultants:  PT  IAnti-Infectives:   Rocephin (pt allergic to Rocephin), transition to Levaquin 11/23 --> changed to Clindamycin 11/25  Regina Presto, MD  [facility] Pager [phone]  If 7PM-7AM, please contact night-coverage [email or web address] Password [email or web address] 04/27/2015, 1:15 PM   LOS: 2 days  HPI/Subjective: No events overnight. More confusion this AM  Objective: Filed Vitals:   04/26/15 2104 04/27/15 0240 04/27/15 0513 04/27/15 0828  BP:   122/73   Pulse:   89   Temp:   98.9 F (37.2 C)   TempSrc:   Oral   Resp:   20   Height:      Weight:      SpO2: 91% 92% 92% 93%    Intake/Output Summary  (Last 24 hours) at 04/27/15 1315 Last data filed at 04/27/15 1251  Gross per 24 hour  Intake    660 ml  Output   1000 ml  Net   -340 ml    Exam:   General:  Pt is confused, NAD  Cardiovascular: Regular rate and rhythm, no rubs, no gallops  Respiratory: Clear to auscultation bilaterally, mild crackles at bases   Abdomen: Soft, tender in lower abd quadrants, non distended   Data Reviewed: Basic Metabolic Panel:  Recent Labs Lab 04/22/15 0522 04/23/15 0545 04/24/15 0809 04/25/15 0511 04/26/15 0531 04/27/15 0459  NA 147* 140 142 141 141 139  K 3.7 4.1 4.2 3.7 4.0 3.8  CL 104 103 101 101 101 98*  CO2 32 30 33* 32 30 31  GLUCOSE 110* 156* 177* 124* 159* 125*  BUN 17 18 21* 24* 21* 20  CREATININE 1.00 0.73 0.76 0.83 0.73 0.75  CALCIUM 9.1 8.9 9.1 8.9 8.7* 8.8*  MG 2.2  --   --   --   --   --   PHOS 3.7  --   --   --   --   --    Liver Function Tests:  Recent Labs Lab 04/22/15 0522  AST 33  ALT 26  ALKPHOS 63  BILITOT 0.4  PROT 7.3  ALBUMIN 3.9   CBC:  Recent Labs Lab 04/21/15 1922  04/23/15 0545 04/24/15 0809 04/25/15 0511 04/26/15 0531 04/27/15 0459  WBC 13.0*  < > 10.1 18.8* 20.9* 15.1* 19.8*  NEUTROABS 10.4*  --   --   --   --   --  16.6*  HGB 12.4  < > 11.7* 11.0* 11.9* 12.7 12.5  HCT 39.4  < > 38.1 36.0 38.8 40.2 39.8  MCV 84.5  < > 85.4 85.7 86.2 84.1 84.3  PLT 357  < > 324 349 392 364 374  < > = values in this interval not displayed. Cardiac Enzymes:  Recent Labs Lab 04/21/15 2247 04/22/15 0522 04/22/15 1042  TROPONINI <0.03 <0.03 <0.03   Recent Results (from the past 240 hour(s))  Culture, Urine     Status: None   Collection Time: 04/21/15  8:02 PM  Result Value Ref Range Status   Specimen Description URINE, CATHETERIZED  Final   Special Requests NONE  Final   Culture   Final    MULTIPLE SPECIES PRESENT, SUGGEST RECOLLECTION Performed at [facility]    Report Status 04/23/2015 FINAL  Final  Urine culture     Status:  None   Collection Time: 04/25/15 12:39 AM  Result Value Ref Range Status   Specimen Description URINE, CATHETERIZED  Final   Special Requests Normal  Final   Culture   Final    NO GROWTH 1 DAY Performed at [facility]    Report Status 04/26/2015 FINAL  Final    Scheduled Meds: . acetaminophen  500 mg Oral TID  . amiodarone  200 mg Oral q morning - 10a  . arformoterol  15 mcg  Nebulization BID  . PULMICORT nebulizer   0.25 mg Nebulization BID  . calcitonin (salmon)  1 spray Alternating Nares QPM  . docusate sodium  100 mg Oral BID  . fentaNYL  25 mcg Transdermal Q72H  . FLUoxetine  20 mg Oral q morning - 10a  . furosemide  20 mg Oral Daily  . ipratropium  0.5 mg Nebulization TID  . levalbuterol  0.63 mg Nebulization Q6H WA  . levofloxacin  IV  500 mg Intravenous Q24H  . losartan  50 mg Oral q morning - 10a  . mirabegron ER  50 mg Oral QPM  . mirtazapine  15 mg Oral QHS  . polyethylene glycol  17 g Oral Daily  . PredniSONE  60 mg Oral QAC breakfast  . senna-docusate  1 tablet Oral QHS   Continuous Infusions:

## 2015-04-27 NOTE — Progress Notes (Signed)
ANTICOAGULATION CONSULT NOTE - Initial Consult  Pharmacy Consult for heparin Indication: atrial fibrillation - bridge therapy  Allergies  Allergen Reactions  . Amoxicillin Other (See Comments)    Can't remember reaction  . Doxycycline Other (See Comments)    Can't remember reaction  . Penicillins Other (See Comments)    Can't remember reaction  . Sulfa Antibiotics Other (See Comments)    Can't remember reaction    Patient Measurements: Height: 5\' 3"  (160 cm) Weight: 179 lb 10.8 oz (81.5 kg) IBW/kg (Calculated) : 52.4 Heparin Dosing Weight: 70kg  Vital Signs: Temp: 98.9 F (37.2 C) (11/25 0513) Temp Source: Oral (11/25 0513) BP: 122/73 mmHg (11/25 0513) Pulse Rate: 89 (11/25 0513)  Labs:  Recent Labs  04/25/15 0511 04/26/15 0531 04/27/15 0459  HGB 11.9* 12.7 12.5  HCT 38.8 40.2 39.8  PLT 392 364 374  LABPROT 21.6* 16.5* 15.6*  INR 1.89* 1.32 1.22  CREATININE 0.83 0.73 0.75    Estimated Creatinine Clearance: 55.7 mL/min (by C-G formula based on Cr of 0.75).   Medical History: Past Medical History  Diagnosis Date  . Irregular heart beat   . Hypertension   . Hypercholesteremia   . Depression   . Atrial fibrillation (HCC)   . Bowel obstruction (HCC)   . S/P cardiac pacemaker procedure, 02/03/13, Medtronic pacer 02/04/2013    sick sinus syndrome  . CVA (cerebral infarction)   . COPD (chronic obstructive pulmonary disease) (HCC)   . Tobacco abuse     quit 2009  . History of echocardiogram 05/21/2012    TEE; EF 55-60%; mild grade 1 atherosclerosis of prox descending aorta & distal aortic arch  . History of nuclear stress test 11/07/2009    dipyridamole; normal pattern of perfusion; negative for ischemia; low risk   . Carotid artery disease (HCC)     carotid duplex 10/2012 - bilat ICAs normla patency with mod tortuosity of vessel, L vertebral artery demonstrated occlsuve disease    Assessment: [age] YOF presents with back pain with plans for kyphoplasty 11/25.   This had to be cancelled d/t hypoxia and pulmonary edema per CT chest. She is on warfarin for PAF (CHADS-VASc = 7), pharmacy asked to bridge with heparin gtt.   Home warfarin dose 2.5mg  daily, Last dose 11/19  Today, 04/27/2015  INR = 1.22  CBC Hgb and pltc WNL  Goal of Therapy:  Heparin level 0.3-0.7 units/ml Monitor platelets by anticoagulation protocol: Yes   Plan:   Start heparin at 1050 units/hr  8h heparin level  Daily CBC and heparin  Follow-up plans for re-scheduled procedure  Juliette Alcideustin Nadim Malia, PharmD, BCPS.   Pager: [phone] 04/27/2015 3:45 PM

## 2015-04-27 NOTE — Progress Notes (Signed)
Pt scheduled for kyphoplasty and presently has O2 sat of 82%. Dr. Izola PriceMyers notified and orders obtained for ABG.  Will continue to monitor pt. Maeola Harmanark, Solenne Manwarren Johnson

## 2015-04-27 NOTE — Progress Notes (Signed)
OT Cancellation Note  Patient Details Name: Richarda OverlieShirley A Yett MRN: [id number] DOB: 03/29/1932   Cancelled Treatment:    Reason Eval/Treat Not Completed: Other (comment).  Plan is for KP today.  Will check back.  Antonia Culbertson 04/27/2015, 7:44 AM  Marica OtterMaryellen Lucillie Kiesel, OTR/L [phone] 04/27/2015

## 2015-04-27 NOTE — Progress Notes (Signed)
Results of ABG given to Dr. Izola PriceMyers and interventional radiology RN. Will hold kyphoplasty procedure until respiratory issues/concerns addressed by MD and interventional radiologist. [name], [name]

## 2015-04-27 NOTE — Progress Notes (Signed)
Initial shift assessment revealed the patient to exhibit a variance of cognitive and behavioral disturbances, including severe confusion, disorientation, tangential speech, high impulsivity, a heightened degree of anxiety, increasing agitation and restlessness, requiring PRN Haloperidol dosage. Speech frequently demonstrated disorganization, lack of focus and deviation from actual topic. Patient made multiple, repetitive attempts to pull, remove  or tug at lines and tubings, such as the peripheral IV access site, Venturi mask and the indwelling urinary catheter, and did not consistently follow commands. Patient showed both positive and negative responses to verbal redirection, with the latter being the more predominant response. At 21:30, IV site inspection showed evidence of full catheter dislodgement from original placement site, with the occlusive dressing completely peeled off. There was minimal oozing of blood from the catheter exit site, requiring application of pressure dressing over the site, which eventually controlled the bleeding. There was no evidence of local edema, erythema, skin blanching, tenderness or warmth around the insertion site. New IV site was restarted by this writer at 23:30, on the patient's right forearm. Procedure was well-tolerated by patient.  At 23:37, patient was subsequently medicated with a PRN intravenous dose of Haloperidol, which resulted in improvement of behavior. Medication effectiveness was immediate but nonsustained. At 03:43, patient abruptly awakened from sleep, with sudden onset of SOB, along with a concurrent symptom of lower back pain. Pulmonary auscultation revealed minimal coarseness in both upper lobes, with few, scattered, fine, end-expiratory wheezing, with  diminished breath sounds at the bases. Patient had repeatedly pulled out her Venturi mask at that time and was highly resistive to re-application of  O2 device, resulting in immediate decline of oxygen  saturation levels to the 70's. Patient remained steadfastly firm in her refusal, despite explanation by this writer of the risks involved. Patient repeatedly cried out, "Help me get up. I can't breath and my back hurts!". Patient was medicated with a PRN intravenous dose of Hydromorphone at that time, which resulted in pain relief. Respiratory therapist Irma NewnessJessica Neugent was notified of aforementioned events at 03:49. Sudden, paroxysmal episode of SOB required a PRN dose of Xopenex nebulizer treatment at that time and subsequent FIO2 increase from 50% Venturi mask to 55%, per RT. Patient reported significant improvement of SOB after aforementioned interventions. Provider on-call Rolan Lipahomas Michael Callahan, was notified of above events at 05:04. No new orders received.

## 2015-04-27 NOTE — Progress Notes (Signed)
Patient ID: Regina OverlieShirley A Centanni, female   DOB: 05-21-1932, 81 y.o.   MRN: [id number] Due to patient's hypoxia and recent CT chest revealing element of edema kyphoplasty has been postponed today. Will tentatively plan case for 11/28 if respiratory status is stable. Patient's left upper extremity IV site is also infiltrated. Both nurse and Dr. Izola PriceMyers have been made aware of above.

## 2015-04-28 LAB — CBC
HEMATOCRIT: 35.6 % — AB (ref 36.0–46.0)
HEMOGLOBIN: 11.5 g/dL — AB (ref 12.0–15.0)
MCH: 26.6 pg (ref 26.0–34.0)
MCHC: 32.3 g/dL (ref 30.0–36.0)
MCV: 82.2 fL (ref 78.0–100.0)
PLATELETS: 318 10*3/uL (ref 150–400)
RBC: 4.33 MIL/uL (ref 3.87–5.11)
RDW: 15.5 % (ref 11.5–15.5)
WBC: 19.2 10*3/uL — AB (ref 4.0–10.5)

## 2015-04-28 LAB — BASIC METABOLIC PANEL
Anion gap: 9 (ref 5–15)
BUN: 22 mg/dL — ABNORMAL HIGH (ref 6–20)
CALCIUM: 8.4 mg/dL — AB (ref 8.9–10.3)
CO2: 31 mmol/L (ref 22–32)
CREATININE: 0.81 mg/dL (ref 0.44–1.00)
Chloride: 99 mmol/L — ABNORMAL LOW (ref 101–111)
GFR calc non Af Amer: >60 mL/min (ref >60)
Glucose, Bld: 156 mg/dL — ABNORMAL HIGH (ref 65–99)
Potassium: 3.5 mmol/L (ref 3.5–5.1)
SODIUM: 139 mmol/L (ref 135–145)

## 2015-04-28 LAB — HEPARIN LEVEL (UNFRACTIONATED)
HEPARIN UNFRACTIONATED: 0.14 [IU]/mL — AB (ref 0.30–0.70)
HEPARIN UNFRACTIONATED: 0.3 [IU]/mL (ref 0.30–0.70)
Heparin Unfractionated: <0.1 IU/mL — ABNORMAL LOW (ref 0.30–0.70)
Heparin Unfractionated: 0.42 IU/mL (ref 0.30–0.70)

## 2015-04-28 LAB — PROTIME-INR
INR: 1.3 (ref 0.00–1.49)
Prothrombin Time: 16.3 seconds — ABNORMAL HIGH (ref 11.6–15.2)

## 2015-04-28 MED ORDER — VITAMINS A & D EX OINT
TOPICAL_OINTMENT | CUTANEOUS | Status: AC
  Filled 2015-04-28: qty 5

## 2015-04-28 MED ORDER — PREDNISONE 5 MG PO TABS
30.0000 mg | ORAL_TABLET | Freq: Every day | ORAL | Status: DC
  Administered 2015-04-29 – 2015-04-30 (×2): 30 mg via ORAL
  Filled 2015-04-28 (×2): qty 2

## 2015-04-28 NOTE — Evaluation (Signed)
Clinical/Bedside Swallow Evaluation Patient Details  Name: Regina OverlieShirley A Bustamante MRN: [id number] Date of Birth: 1931/12/30  Today's Date: 04/28/2015 Time: SLP Start Time (ACUTE ONLY): 1430 SLP Stop Time (ACUTE ONLY): 1445 SLP Time Calculation (min) (ACUTE ONLY): 15 min  Past Medical History:  Past Medical History  Diagnosis Date  . Irregular heart beat   . Hypertension   . Hypercholesteremia   . Depression   . Atrial fibrillation (HCC)   . Bowel obstruction (HCC)   . S/P cardiac pacemaker procedure, 02/03/13, Medtronic pacer 02/04/2013    sick sinus syndrome  . CVA (cerebral infarction)   . COPD (chronic obstructive pulmonary disease) (HCC)   . Tobacco abuse     quit 2009  . History of echocardiogram 05/21/2012    TEE; EF 55-60%; mild grade 1 atherosclerosis of prox descending aorta & distal aortic arch  . History of nuclear stress test 11/07/2009    dipyridamole; normal pattern of perfusion; negative for ischemia; low risk   . Carotid artery disease (HCC)     carotid duplex 10/2012 - bilat ICAs normla patency with mod tortuosity of vessel, L vertebral artery demonstrated occlsuve disease   Past Surgical History:  Past Surgical History  Procedure Laterality Date  . Tee without cardioversion  05/21/2012    Procedure: TRANSESOPHAGEAL ECHOCARDIOGRAM (TEE);  Surgeon: Chrystie NoseKenneth C. Hilty, MD;  [facility];  Service: Cardiovascular;  Laterality: N/A;  . Cardioversion  05/21/2012    Procedure: CARDIOVERSION;  Surgeon: Chrystie NoseKenneth C. Hilty, MD;  [facility];  Service: Cardiovascular;  Laterality: N/A;  . Abdominal surgery    . Tonsillectomy    . Appendectomy    . Abdominal hysterectomy    . Colporrhaphy      posterior  . Pacemaker insertion  07/07/2012    medtronic  . Permanent pacemaker insertion Left 02/03/2013    Procedure: PERMANENT PACEMAKER INSERTION;  Surgeon: Thurmon FairMihai Croitoru, MD;  Location: [facility] CATH LAB;  Service: Cardiovascular;  Laterality: Left;   HPI:  81 y.o. Female,  S/P cardiac pacemaker procedure, 02/03/13, Medtronic pacer, COPD, [facility] with main concern of 3 weeks duration of progressively worsening mid area back pain, constant and throbbing, 7/10 in severity and worse with movement, with no specific alleviating factors. CT scan showed T6 acute compression fracture with 40% body loss. Pt with increased WBC and CT suspicious for aspiration pna - debris in the trachea.    Assessment / Plan / Recommendation Clinical Impression  SLP called to pts room by RN as pt became fully alert though still requiring ventimask. Pt presents with probable radiographic evidence of aspiration, now with increased aspiration risk from respiratory distress, anxiety and intermittent sedation. Trials of thin liquids resulted in both immediate and weak delayed coughing likely indicative of aspiration events with unreliable sensation. RR increased with trials though no measurement available for exact reading. Trials of puree tolerated well. Recommend pt remain NPO except for necessary meds given in puree. Discussed with RN and pt. Will f/u for PO trials and probable MBS when respiratory function becomes more stable.     Aspiration Risk  Severe aspiration risk    Diet Recommendation     Medication Administration: Whole meds with puree    Other  Recommendations Oral Care Recommendations: Oral care QID   Follow up Recommendations  Skilled Nursing facility    Frequency and Duration min 2x/week  2 weeks       Prognosis Prognosis for Safe Diet Advancement: Fair  Swallow Study   General HPI: 81 y.o. Female, S/P cardiac pacemaker procedure, 02/03/13, Medtronic pacer, COPD, presented to [facility] ED with main concern of 3 weeks duration of progressively worsening mid area back pain, constant and throbbing, 7/10 in severity and worse with movement, with no specific alleviating factors. CT scan showed T6 acute compression fracture with 40% body loss. Pt with increased WBC and CT  suspicious for aspiration pna - debris in the trachea.  Type of Study: Bedside Swallow Evaluation Diet Prior to this Study: Regular;Thin liquids Temperature Spikes Noted: No Respiratory Status: Venti-mask History of Recent Intubation: No Behavior/Cognition: Alert (anxious) Oral Cavity Assessment: Dry Oral Care Completed by SLP: Yes Oral Cavity - Dentition: Edentulous Vision: Functional for self-feeding Self-Feeding Abilities: Needs assist Patient Positioning: Upright in bed Baseline Vocal Quality: Normal Volitional Cough: Weak Volitional Swallow: Able to elicit    Oral/Motor/Sensory Function Overall Oral Motor/Sensory Function: Within functional limits   Ice Chips     Thin Liquid Thin Liquid: Impaired Presentation: Cup;Straw Pharyngeal  Phase Impairments: Suspected delayed Swallow;Cough - Immediate;Cough - Delayed;Change in Vital Signs    Nectar Thick Nectar Thick Liquid: Not tested   Honey Thick Honey Thick Liquid: Not tested   Puree Puree: Within functional limits   Solid Solid: Not tested      Harlon Ditty, MA CCC-SLP [phone]  Claudine Mouton 04/28/2015,3:28 PM

## 2015-04-28 NOTE — Progress Notes (Signed)
ANTICOAGULATION CONSULT NOTE - Follow Up Consult  Pharmacy Consult for Heparin Indication: atrial fibrillation - bridge therapy  Allergies  Allergen Reactions  . Amoxicillin Other (See Comments)    Can't remember reaction  . Doxycycline Other (See Comments)    Can't remember reaction  . Penicillins Other (See Comments)    Can't remember reaction  . Sulfa Antibiotics Other (See Comments)    Can't remember reaction    Patient Measurements: Height: 5\' 3"  (160 cm) Weight: 179 lb 10.8 oz (81.5 kg) IBW/kg (Calculated) : 52.4 Heparin Dosing Weight:   Vital Signs: Temp: 98.4 F (36.9 C) (11/26 0356) Temp Source: Oral (11/26 0356) BP: 127/60 mmHg (11/26 0356) Pulse Rate: 67 (11/26 0356)  Labs:  Recent Labs  04/26/15 0531 04/27/15 0459 04/28/15 0107  HGB 12.7 12.5 11.5*  HCT 40.2 39.8 35.6*  PLT 364 374 318  LABPROT 16.5* 15.6* 16.3*  INR 1.32 1.22 1.30  HEPARINUNFRC  --   --  <0.10*  CREATININE 0.73 0.75 0.81    Estimated Creatinine Clearance: 55 mL/min (by C-G formula based on Cr of 0.81).   Medications:  Infusions:  . heparin 1,050 Units/hr (04/27/15 2330)    Assessment: Patient with low heparin level.  However, per RN the patient lost IV access and drip was not restarted until ~2300.  No other issues noted per RN.  Will consider the low level as a false level.  Reschedule level for ~ 8 hr after restart.  Goal of Therapy:  Heparin level 0.3-0.7 units/ml Monitor platelets by anticoagulation protocol: Yes   Plan:  Recheck level at 0800  Darlina GuysGrimsley Jr, Jacquenette ShoneJulian Crowford 04/28/2015,5:00 AM

## 2015-04-28 NOTE — Progress Notes (Signed)
OT Cancellation Note  Patient Details Name: [name] MRN: [id number] DOB: 1931-10-06   Cancelled Treatment:    Reason Eval/Treat Not Completed: Medical issues which prohibited therapy.  Noted pt with respiratory issues, confusion and KP could not be performed yesterday.  Will check back after procedure is done.    Clovis Warwick 04/28/2015, 7:51 AM  [name], OTR/L [phone] 04/28/2015

## 2015-04-28 NOTE — Progress Notes (Signed)
SLP Cancellation Note  Patient Details Name: Regina OverlieShirley A Liberatore MRN: [id number] DOB: Jun 15, 1931   Cancelled treatment:       Reason Eval/Treat Not Completed: Patient not medically ready. Swallow eval ordered due to suspicion of aspiration leading to respiratory distress. RN reports pt has not been stable, desaturates when mask is removed, has been restless and was given Haldol recently to calm pt. Will need swallow eval, but no appropriate at this time.    Domingo Fuson, Riley NearingBonnie Caroline 04/28/2015, 1:51 PM

## 2015-04-28 NOTE — Progress Notes (Signed)
ANTICOAGULATION CONSULT NOTE  Pharmacy Consult for heparin Indication: atrial fibrillation - bridge therapy  Allergies  Allergen Reactions  . Amoxicillin Other (See Comments)    Can't remember reaction  . Doxycycline Other (See Comments)    Can't remember reaction  . Penicillins Other (See Comments)    Can't remember reaction  . Sulfa Antibiotics Other (See Comments)    Can't remember reaction    Patient Measurements: Height: 5\' 3"  (160 cm) Weight: 179 lb 10.8 oz (81.5 kg) IBW/kg (Calculated) : 52.4 Heparin Dosing Weight: 70kg  Vital Signs: Temp: 98.4 F (36.9 C) (11/26 0356) Temp Source: Oral (11/26 0356) BP: 127/60 mmHg (11/26 0356) Pulse Rate: 67 (11/26 0356)  Labs:  Recent Labs  04/26/15 0531 04/27/15 0459 04/28/15 0107 04/28/15 0836  HGB 12.7 12.5 11.5*  --   HCT 40.2 39.8 35.6*  --   PLT 364 374 318  --   LABPROT 16.5* 15.6* 16.3*  --   INR 1.32 1.22 1.30  --   HEPARINUNFRC  --   --  <0.10* 0.14*  CREATININE 0.73 0.75 0.81  --     Estimated Creatinine Clearance: 55 mL/min (by C-G formula based on Cr of 0.81).   Medical History: Past Medical History  Diagnosis Date  . Irregular heart beat   . Hypertension   . Hypercholesteremia   . Depression   . Atrial fibrillation (HCC)   . Bowel obstruction (HCC)   . S/P cardiac pacemaker procedure, 02/03/13, Medtronic pacer 02/04/2013    sick sinus syndrome  . CVA (cerebral infarction)   . COPD (chronic obstructive pulmonary disease) (HCC)   . Tobacco abuse     quit 2009  . History of echocardiogram 05/21/2012    TEE; EF 55-60%; mild grade 1 atherosclerosis of prox descending aorta & distal aortic arch  . History of nuclear stress test 11/07/2009    dipyridamole; normal pattern of perfusion; negative for ischemia; low risk   . Carotid artery disease (HCC)     carotid duplex 10/2012 - bilat ICAs normla patency with mod tortuosity of vessel, L vertebral artery demonstrated occlsuve disease    Assessment: [age]  YOF presents with back pain with plans for kyphoplasty 11/25.  This had to be cancelled d/t hypoxia and pulmonary edema per CT chest. She is on warfarin for PAF (CHADS-VASc = 7), pharmacy asked to bridge with heparin gtt.   Home warfarin dose 2.5mg  daily, Last dose 11/19  Today, 04/28/2015  Heparin level sub-therapeutic (0.14)  CBC Hgb and pltc WNL  No interruptions in infusion or bleeding report per RN  Goal of Therapy:  Heparin level 0.3-0.7 units/ml Monitor platelets by anticoagulation protocol: Yes   Plan:   Increase heparin to 1300 units/hr  Check 8h heparin level  Daily CBC and heparin  Kyphoplasty tentatively rescheduled for 11/28.  F/U anticoagulation plans peri-procedure.   Junita PushMichelle Shadawn Hanaway, PharmD, BCPS Pager: [phone] 04/28/2015 9:23 AM

## 2015-04-28 NOTE — Progress Notes (Signed)
Patient ID: [name], female   DOB: 30-Jul-1931, 81 y.o.   MRN: [id number]   [facility] PROGRESS NOTE  HIYAB NHEM WUJ:[id number] DOB: Dec 25, 1931 DOA: 04/21/2015 PCP: [name], MD   Brief narrative:    81 y.o. Female, S/P cardiac pacemaker procedure, 02/03/13, Medtronic pacer, COPD, presented to [facility] ED with main concern of 3 weeks duration of progressively worsening mid area back pain, constant and throbbing, 7/10 in severity and worse with movement, with no specific alleviating factors. CT scan showed T6 acute compression fracture with 40% body loss.  Assessment/Plan:    Principal Problem:   Acute on chronic respiratory failure (HCC) with hypoxia  - secondary to COPD and with worsening hypoxia 11/25, CT chest obtained, was worrisome for aspiration PNA, started Clindamycin 11/25 and will continue same regimen  - persistent hypoxia, d/w son at bedside, relatively poor prognosis and high risk for aspiration  - he will discuss with family to follow upon code status and thinks its his mother wish to be comfortable with no life support  - on Prednisone, will continue tapering down  - continue BD's scheduled and as needed, brovana   Active Problems:   Leukocytosis - still elevated WBC, CT chest with ? Aspiration PNA - steroids are being tapered down - continue Clindamycin day #2    High risk aspiration - SLP requested     Hospital induced delirium  - persistent confusion, haldol as needed  - monitor     Accelerated HTN - SBP in 160's - on Lasix and Losartan - continue hydralazine as needed for better BP control     PAF (paroxysmal atrial fibrillation) (HCC) - rate controlled  - Continue amiodarone.  - AC on hold in anticipation of possible procedure but procedure cancelled so will resume AC - may be able to do vertebroplasty on Monday     Chronic diastolic congestive heart failure (HCC) - mild crackles on exam  - weight 81.5 kg - continue to monitor  daily weights, strict I/O - continue Lasix     Hypokalemia - supplemented and WNL this AM    S/P cardiac pacemaker procedure, 02/03/13, Medtronic pacer - stable     Chest pain - resolved     T6 compression fracture - bone scan with increased activity over the midthoracic spine at T6 consistent with compression fracture  - plan for kyphoplasty once hypoxia resolved     Obesity  - Body mass index is 31.84 kg/(m^2).  DVT prophylaxis - SCD's  Code Status: Full.  Family Communication:  plan of care discussed with son at bedside  Disposition Plan: SNF when hypoxia and mental status improves   IV access:  Peripheral IV  Procedures and diagnostic studies:    Dg Chest 2 View 04/21/2015  Stable mild cardiomegaly without overt pulmonary edema. No active pulmonary disease.  Dg Thoracic Spine 2 View 04/23/2015 Severe compression deformity of T6 vertebral body is noted consistent with compression fracture of indeterminate age. This corresponds in location to abnormality seen on nuclear medicine study.  Dg Lumbar Spine Complete 04/12/2015 There is moderate multilevel degenerative disc and facet joint change. There is no acute compression fracture. If the patient's symptoms persist, lumbar spine MRI may be useful. 2. Increase colonic stool burden diffusely may reflect constipation in the appropriate clinical setting.   Nm Bone Scan Whole Body 04/23/2015   Increased activity noted over the mid thoracic spine at approximately T6. This could correspond to previously questioned T6 compression fracture.  Dg Chest Port 1 View 04/22/2015 No active disease.   Medical Consultants:  IR PCCM PCT  Other Consultants:  PT  IAnti-Infectives:   Rocephin (pt allergic to Rocephin), transition to Levaquin 11/23 --> changed to Clindamycin 11/25  Debbora PrestoMAGICK-Verlean Allport, MD  [facility] Pager [phone]  If 7PM-7AM, please contact night-coverage [email or web address] Password [email or web address] 04/28/2015, 2:34 PM   LOS: 3  days   HPI/Subjective: No events overnight. More confusion this AM  Objective: Filed Vitals:   04/28/15 0349 04/28/15 0350 04/28/15 0351 04/28/15 0356  BP:    127/60  Pulse:    67  Temp:    98.4 F (36.9 C)  TempSrc:    Oral  Resp:    20  Height:      Weight:      SpO2: 79% 88% 90% 92%    Intake/Output Summary (Last 24 hours) at 04/28/15 1434 Last data filed at 04/28/15 1234  Gross per 24 hour  Intake 204.25 ml  Output   1910 ml  Net -1705.75 ml    Exam:   General:  Pt is confused, NAD  Cardiovascular: Regular rate and rhythm, no rubs, no gallops  Respiratory: Clear to auscultation bilaterally, mild crackles at bases   Abdomen: Soft, tender in lower abd quadrants, non distended   Data Reviewed: Basic Metabolic Panel:  Recent Labs Lab 04/22/15 0522  04/24/15 0809 04/25/15 0511 04/26/15 0531 04/27/15 0459 04/28/15 0107  NA 147*  < > 142 141 141 139 139  K 3.7  < > 4.2 3.7 4.0 3.8 3.5  CL 104  < > 101 101 101 98* 99*  CO2 32  < > 33* 32 30 31 31   GLUCOSE 110*  < > 177* 124* 159* 125* 156*  BUN 17  < > 21* 24* 21* 20 22*  CREATININE 1.00  < > 0.76 0.83 0.73 0.75 0.81  CALCIUM 9.1  < > 9.1 8.9 8.7* 8.8* 8.4*  MG 2.2  --   --   --   --   --   --   PHOS 3.7  --   --   --   --   --   --   < > = values in this interval not displayed. Liver Function Tests:  Recent Labs Lab 04/22/15 0522  AST 33  ALT 26  ALKPHOS 63  BILITOT 0.4  PROT 7.3  ALBUMIN 3.9   CBC:  Recent Labs Lab 04/21/15 1922  04/24/15 0809 04/25/15 0511 04/26/15 0531 04/27/15 0459 04/28/15 0107  WBC 13.0*  < > 18.8* 20.9* 15.1* 19.8* 19.2*  NEUTROABS 10.4*  --   --   --   --  16.6*  --   HGB 12.4  < > 11.0* 11.9* 12.7 12.5 11.5*  HCT 39.4  < > 36.0 38.8 40.2 39.8 35.6*  MCV 84.5  < > 85.7 86.2 84.1 84.3 82.2  PLT 357  < > 349 392 364 374 318  < > = values in this interval not displayed. Cardiac Enzymes:  Recent Labs Lab 04/21/15 2247 04/22/15 0522 04/22/15 1042   TROPONINI <0.03 <0.03 <0.03   Recent Results (from the past 240 hour(s))  Culture, Urine     Status: None   Collection Time: 04/21/15  8:02 PM  Result Value Ref Range Status   Specimen Description URINE, CATHETERIZED  Final   Special Requests NONE  Final   Culture   Final    MULTIPLE SPECIES PRESENT, SUGGEST RECOLLECTION Performed [facility]  Report Status 04/23/2015 FINAL  Final  Urine culture     Status: None   Collection Time: 04/25/15 12:39 AM  Result Value Ref Range Status   Specimen Description URINE, CATHETERIZED  Final   Special Requests Normal  Final   Culture   Final    NO GROWTH 1 DAY Performed at [facility]    Report Status 04/26/2015 FINAL  Final    Scheduled Meds: . acetaminophen  500 mg Oral TID  . amiodarone  200 mg Oral q morning - 10a  . arformoterol  15 mcg Nebulization BID  . PULMICORT nebulizer   0.25 mg Nebulization BID  . calcitonin (salmon)  1 spray Alternating Nares QPM  . docusate sodium  100 mg Oral BID  . fentaNYL  25 mcg Transdermal Q72H  . FLUoxetine  20 mg Oral q morning - 10a  . furosemide  20 mg Oral Daily  . ipratropium  0.5 mg Nebulization TID  . levalbuterol  0.63 mg Nebulization Q6H WA  . levofloxacin  IV  500 mg Intravenous Q24H  . losartan  50 mg Oral q morning - 10a  . mirabegron ER  50 mg Oral QPM  . mirtazapine  15 mg Oral QHS  . polyethylene glycol  17 g Oral Daily  . PredniSONE  60 mg Oral QAC breakfast  . senna-docusate  1 tablet Oral QHS   Continuous Infusions: . heparin 1,300 Units/hr (04/28/15 0928)

## 2015-04-28 NOTE — Progress Notes (Signed)
PT Cancellation Note  Patient Details Name: Regina OverlieShirley A Lavallee MRN: [id number] DOB: 01/04/1932   Cancelled Treatment:    Reason Eval/Treat Not Completed: Medical issues which prohibited therapy (noted surgery held yesterday due to hypoxia. Will evaluate following surgery. )   [name], [name] 04/28/2015, 10:20 AM [phone]

## 2015-04-28 NOTE — Progress Notes (Signed)
ANTICOAGULATION CONSULT NOTE  Pharmacy Consult for heparin Indication: atrial fibrillation - bridge therapy  Allergies  Allergen Reactions  . Amoxicillin Other (See Comments)    Can't remember reaction  . Doxycycline Other (See Comments)    Can't remember reaction  . Penicillins Other (See Comments)    Can't remember reaction  . Sulfa Antibiotics Other (See Comments)    Can't remember reaction    Patient Measurements: Height: 5\' 3"  (160 cm) Weight: 179 lb 10.8 oz (81.5 kg) IBW/kg (Calculated) : 52.4 Heparin Dosing Weight: 70kg  Vital Signs: Temp: 98.4 F (36.9 C) (11/26 1400) Temp Source: Oral (11/26 1400) BP: 121/75 mmHg (11/26 1400) Pulse Rate: 81 (11/26 1400)  Labs:  Recent Labs  04/26/15 0531 04/27/15 0459 04/28/15 0107 04/28/15 0836 04/28/15 1805  HGB 12.7 12.5 11.5*  --   --   HCT 40.2 39.8 35.6*  --   --   PLT 364 374 318  --   --   LABPROT 16.5* 15.6* 16.3*  --   --   INR 1.32 1.22 1.30  --   --   HEPARINUNFRC  --   --  <0.10* 0.14* 0.30  CREATININE 0.73 0.75 0.81  --   --     Estimated Creatinine Clearance: 55 mL/min (by C-G formula based on Cr of 0.81).   Medical History: Past Medical History  Diagnosis Date  . Irregular heart beat   . Hypertension   . Hypercholesteremia   . Depression   . Atrial fibrillation (HCC)   . Bowel obstruction (HCC)   . S/P cardiac pacemaker procedure, 02/03/13, Medtronic pacer 02/04/2013    sick sinus syndrome  . CVA (cerebral infarction)   . COPD (chronic obstructive pulmonary disease) (HCC)   . Tobacco abuse     quit 2009  . History of echocardiogram 05/21/2012    TEE; EF 55-60%; mild grade 1 atherosclerosis of prox descending aorta & distal aortic arch  . History of nuclear stress test 11/07/2009    dipyridamole; normal pattern of perfusion; negative for ischemia; low risk   . Carotid artery disease (HCC)     carotid duplex 10/2012 - bilat ICAs normla patency with mod tortuosity of vessel, L vertebral artery  demonstrated occlsuve disease    Assessment: [age] YOF presents with back pain with plans for kyphoplasty 11/25.  This had to be cancelled d/t hypoxia and pulmonary edema per CT chest. She is on warfarin for PAF (CHADS-VASc = 7), pharmacy asked to bridge with heparin gtt.   Home warfarin dose 2.5mg  daily, Last dose 11/19  Today, 04/28/2015  Heparin level therapeutic (0.3)  CBC Hgb and pltc WNL  No interruptions in infusion or bleeding report per RN  Goal of Therapy:  Heparin level 0.3-0.7 units/ml Monitor platelets by anticoagulation protocol: Yes   Plan:   Cont heparin at 1300 units/hr  Check a confirmatory heparin level in 6 hrs  Daily CBC and heparin  Kyphoplasty tentatively rescheduled for 11/28.  F/U anticoagulation plans peri-procedure.   Charolotte Ekeom Esabella Stockinger, PharmD, pager [phone]. 04/28/2015,6:58 PM.

## 2015-04-29 LAB — BASIC METABOLIC PANEL
Anion gap: 10 (ref 5–15)
BUN: 22 mg/dL — AB (ref 6–20)
CALCIUM: 8.4 mg/dL — AB (ref 8.9–10.3)
CHLORIDE: 101 mmol/L (ref 101–111)
CO2: 31 mmol/L (ref 22–32)
CREATININE: 0.64 mg/dL (ref 0.44–1.00)
GFR calc non Af Amer: >60 mL/min (ref >60)
Glucose, Bld: 105 mg/dL — ABNORMAL HIGH (ref 65–99)
Potassium: 3.4 mmol/L — ABNORMAL LOW (ref 3.5–5.1)
SODIUM: 142 mmol/L (ref 135–145)

## 2015-04-29 LAB — CBC
HEMATOCRIT: 36.2 % (ref 36.0–46.0)
HEMOGLOBIN: 11.3 g/dL — AB (ref 12.0–15.0)
MCH: 26.1 pg (ref 26.0–34.0)
MCHC: 31.2 g/dL (ref 30.0–36.0)
MCV: 83.6 fL (ref 78.0–100.0)
Platelets: 338 10*3/uL (ref 150–400)
RBC: 4.33 MIL/uL (ref 3.87–5.11)
RDW: 15.8 % — ABNORMAL HIGH (ref 11.5–15.5)
WBC: 20.7 10*3/uL — ABNORMAL HIGH (ref 4.0–10.5)

## 2015-04-29 LAB — PROTIME-INR
INR: 1.35 (ref 0.00–1.49)
Prothrombin Time: 16.8 seconds — ABNORMAL HIGH (ref 11.6–15.2)

## 2015-04-29 LAB — HEPARIN LEVEL (UNFRACTIONATED): Heparin Unfractionated: 0.33 IU/mL (ref 0.30–0.70)

## 2015-04-29 MED ORDER — CLINDAMYCIN HCL 300 MG PO CAPS
300.0000 mg | ORAL_CAPSULE | Freq: Four times a day (QID) | ORAL | Status: DC
  Administered 2015-04-29 – 2015-05-02 (×12): 300 mg via ORAL
  Filled 2015-04-29 (×13): qty 1

## 2015-04-29 NOTE — Progress Notes (Signed)
ANTICOAGULATION CONSULT NOTE  Pharmacy Consult for heparin Indication: atrial fibrillation - bridge therapy  Allergies  Allergen Reactions  . Amoxicillin Other (See Comments)    Can't remember reaction  . Doxycycline Other (See Comments)    Can't remember reaction  . Penicillins Other (See Comments)    Can't remember reaction  . Sulfa Antibiotics Other (See Comments)    Can't remember reaction    Patient Measurements: Height: 5\' 3"  (160 cm) Weight: 182 lb 12.2 oz (82.9 kg) IBW/kg (Calculated) : 52.4 Heparin Dosing Weight: 70kg  Vital Signs: Temp: 98.6 F (37 C) (11/27 0428) Temp Source: Oral (11/27 0428) BP: 129/76 mmHg (11/27 0428) Pulse Rate: 73 (11/27 0428)  Labs:  Recent Labs  04/27/15 0459 04/28/15 0107  04/28/15 1805 04/28/15 2306 04/29/15 0606  HGB 12.5 11.5*  --   --   --  11.3*  HCT 39.8 35.6*  --   --   --  36.2  PLT 374 318  --   --   --  338  LABPROT 15.6* 16.3*  --   --   --  16.8*  INR 1.22 1.30  --   --   --  1.35  HEPARINUNFRC  --  <0.10*  < > 0.30 0.42 0.33  CREATININE 0.75 0.81  --   --   --  0.64  < > = values in this interval not displayed.  Estimated Creatinine Clearance: 56.2 mL/min (by C-G formula based on Cr of 0.64).   Medical History: Past Medical History  Diagnosis Date  . Irregular heart beat   . Hypertension   . Hypercholesteremia   . Depression   . Atrial fibrillation (HCC)   . Bowel obstruction (HCC)   . S/P cardiac pacemaker procedure, 02/03/13, Medtronic pacer 02/04/2013    sick sinus syndrome  . CVA (cerebral infarction)   . COPD (chronic obstructive pulmonary disease) (HCC)   . Tobacco abuse     quit 2009  . History of echocardiogram 05/21/2012    TEE; EF 55-60%; mild grade 1 atherosclerosis of prox descending aorta & distal aortic arch  . History of nuclear stress test 11/07/2009    dipyridamole; normal pattern of perfusion; negative for ischemia; low risk   . Carotid artery disease (HCC)     carotid duplex 10/2012 -  bilat ICAs normla patency with mod tortuosity of vessel, L vertebral artery demonstrated occlsuve disease    Assessment: [age] YOF presents with back pain with plans for kyphoplasty 11/25.  This had to be cancelled d/t hypoxia and pulmonary edema per CT chest. She is on warfarin for PAF (CHADS-VASc = 7), pharmacy asked to bridge with heparin gtt.   Home warfarin dose 2.5mg  daily, Last dose 11/19  Today, 04/29/2015  Heparin level remains therapeutic   CBC-Hgb and pltc WNL  No interruptions in infusion or bleeding report per RN  Goal of Therapy:  Heparin level 0.3-0.7 units/ml Monitor platelets by anticoagulation protocol: Yes   Plan:   Continue heparin infusion at 1300 units/hr  Daily CBC and heparin  Kyphoplasty tentatively rescheduled for 11/28.  F/U anticoagulation plans peri-procedure.   Junita PushMichelle Yaremi Stahlman, PharmD, BCPS Pager: [phone] 04/29/2015 7:05 AM

## 2015-04-29 NOTE — Progress Notes (Signed)
Soft diet ordered this morning. Awaiting ST re-eval. After ordering meal, son began feeding patient without staff present. At approx 1410, patient began coughing while eating scrambled eggs and pancakes. RN assisted patient. Patient was able to spit out food and whole pieces of egg. O2 Sats increased to upper 90s again once Venturi mask was reapplied. Respirations also became less labored after coughing subsided. VS stable. MD notified immediately regarding probable aspiration. Gave verbal order for no more food, only liquids and medications. Patient alert with some continued confusion noted at this time. Stable. Will continue to monitor closely.

## 2015-04-29 NOTE — Progress Notes (Signed)
ANTICOAGULATION CONSULT NOTE - Follow Up Consult  Pharmacy Consult for Heparin Indication: atrial fibrillation - bridge therapy  Allergies  Allergen Reactions  . Amoxicillin Other (See Comments)    Can't remember reaction  . Doxycycline Other (See Comments)    Can't remember reaction  . Penicillins Other (See Comments)    Can't remember reaction  . Sulfa Antibiotics Other (See Comments)    Can't remember reaction    Patient Measurements: Height: 5\' 3"  (160 cm) Weight: 179 lb 10.8 oz (81.5 kg) IBW/kg (Calculated) : 52.4 Heparin Dosing Weight:   Vital Signs: Temp: 99.9 F (37.7 C) (11/26 2147) Temp Source: Oral (11/26 2147) BP: 113/49 mmHg (11/26 2147) Pulse Rate: 65 (11/26 2147)  Labs:  Recent Labs  04/26/15 0531 04/27/15 0459  04/28/15 0107 04/28/15 0836 04/28/15 1805 04/28/15 2306  HGB 12.7 12.5  --  11.5*  --   --   --   HCT 40.2 39.8  --  35.6*  --   --   --   PLT 364 374  --  318  --   --   --   LABPROT 16.5* 15.6*  --  16.3*  --   --   --   INR 1.32 1.22  --  1.30  --   --   --   HEPARINUNFRC  --   --   < > <0.10* 0.14* 0.30 0.42  CREATININE 0.73 0.75  --  0.81  --   --   --   < > = values in this interval not displayed.  Estimated Creatinine Clearance: 55 mL/min (by C-G formula based on Cr of 0.81).   Medications:  Infusions:  . heparin 1,300 Units/hr (04/28/15 1736)    Assessment: Patient with heparin level at goal.  No heparin isssues noted.  Goal of Therapy:  Heparin level 0.3-0.7 units/ml Monitor platelets by anticoagulation protocol: Yes   Plan:  Continue heparin drip at current rate Recheck level with AM labs  Darlina GuysGrimsley Jr, Jacquenette ShoneJulian Crowford 04/29/2015,2:34 AM

## 2015-04-29 NOTE — Progress Notes (Signed)
SLP Cancellation Note  Patient Details Name: Richarda OverlieShirley A Gray MRN: [id number] DOB: [birth year]   Cancelled treatment:       Reason Eval/Treat Not Completed: Medical issues which prohibited therapy; pt on 12L of O2 with mask when arrived; re-attempt on 04/30/15   ADAMS,PAT, M.S., CCC-SLP 04/29/2015, 4:07 PM

## 2015-04-29 NOTE — Progress Notes (Addendum)
At 1840, O2 Sats 87-88% while deep breathing on 12L Venturi mask. Respiratory notified and stated they would be giving scheduled nebulizer treatments soon. Minimal wheezing heard and no c/o SOB from patient. No signs of distress. Patient placed on NRB mask 15L at 1850. O2 Sats increased to 92-96% on NRB. MD paged to be notified of change to NRB mask. Awaiting response.  Continue on NRB for now, PCT consulted, son to find out from pt's living will if pt DNR or full code. Pt is at high risk aspiration, unfortunately can not offer more in terms of medical care.

## 2015-04-29 NOTE — Progress Notes (Signed)
Patient ID: [name], female   DOB: Jun 16, 1931, 81 y.o.   MRN: [id number]   [facility] PROGRESS NOTE  [name] [name] DOB: [birth year] DOA: 04/21/2015 PCP: [name], MD   Brief narrative:    81 y.o. Female, S/P cardiac pacemaker procedure, 02/03/13, Medtronic pacer, COPD, presented to [facility] ED with main concern of 3 weeks duration of progressively worsening mid area back pain, constant and throbbing, 7/10 in severity and worse with movement, with no specific alleviating factors. CT scan showed T6 acute compression fracture with 40% body loss.  Assessment/Plan:    Principal Problem:   Acute on chronic respiratory failure (HCC) with hypoxia  - secondary to COPD and with worsening hypoxia 11/25, CT chest obtained, was worrisome for aspiration PNA, started Clindamycin 11/25 and will continue same regimen  - persistent hypoxia, d/w son at bedside, relatively poor prognosis and very high risk for aspiration  - he will discuss with family to follow upon code status and thinks its his mother wish to be comfortable with no life support  - attempted to allow soft diet but pt again almost chocked, continue with aspiration precautions  - on Prednisone, will continue tapering down  - continue BD's scheduled and as needed, brovana   Active Problems:   Leukocytosis - still elevated WBC, CT chest with ? Aspiration PNA - steroids are being tapered down - continue Clindamycin day #3, lost IV access so will have to try PO clindamycin - d/w son at bedside of relatively poor prognosis, not sure will be able to proceed with verebroplasty in AM - CBC in AM    High risk aspiration - SLP done, very high risk aspiration     Hospital induced delirium  - persistent confusion, haldol as needed  - more clear and alert this AM    Accelerated HTN - SBP in 160's - on Lasix and Losartan - continue hydralazine as needed for better BP control     PAF (paroxysmal atrial  fibrillation) (HCC) - rate controlled  - Continue amiodarone.  - AC on hold in anticipation of possible procedure but procedure cancelled so will resume AC - may be able to do vertebroplasty in AM, will discuss with IR    Chronic diastolic congestive heart failure (HCC) - mild crackles on exam  - weight 82.9 kg this AM - continue to monitor daily weights, strict I/O - continue Lasix     Hypokalemia - continue to supplement     S/P cardiac pacemaker procedure, 02/03/13, Medtronic pacer - stable     Chest pain - resolved     T6 compression fracture - bone scan with increased activity over the midthoracic spine at T6 consistent with compression fracture  - plan for kyphoplasty once hypoxia resolved     Obesity  - Body mass index is 31.84 kg/(m^2).  DVT prophylaxis - SCD's  Code Status: Full.  Family Communication:  plan of care discussed with son at bedside  Disposition Plan: SNF when hypoxia and mental status improves   IV access:  Peripheral IV  Procedures and diagnostic studies:    Dg Chest 2 View 04/21/2015  Stable mild cardiomegaly without overt pulmonary edema. No active pulmonary disease.  Dg Thoracic Spine 2 View 04/23/2015 Severe compression deformity of T6 vertebral body is noted consistent with compression fracture of indeterminate age. This corresponds in location to abnormality seen on nuclear medicine study.  Dg Lumbar Spine Complete 04/12/2015 There is moderate multilevel degenerative disc and facet joint  change. There is no acute compression fracture. If the patient's symptoms persist, lumbar spine MRI may be useful. 2. Increase colonic stool burden diffusely may reflect constipation in the appropriate clinical setting.   Nm Bone Scan Whole Body 04/23/2015   Increased activity noted over the mid thoracic spine at approximately T6. This could correspond to previously questioned T6 compression fracture.  Dg Chest Port 1 View 04/22/2015 No active disease.    Medical Consultants:  IR PCCM PCT  Other Consultants:  PT  IAnti-Infectives:   Rocephin (pt allergic to Rocephin), transition to Levaquin 11/23 --> changed to Clindamycin 11/25  [name], MD  [facility] Pager [phone]  If 7PM-7AM, please contact night-coverage [email or web address] Password [email or web address] 04/29/2015, 2:54 PM   LOS: 4 days   HPI/Subjective: No events overnight. More confusion this AM  Objective: Filed Vitals:   04/29/15 0428 04/29/15 0858 04/29/15 1352 04/29/15 1417  BP: 129/76   151/89  Pulse: 73   80  Temp: 98.6 F (37 C)     TempSrc: Oral     Resp: 20   18  Height:      Weight: 82.9 kg (182 lb 12.2 oz)     SpO2: 95% 100% 96% 85%    Intake/Output Summary (Last 24 hours) at 04/29/15 1454 Last data filed at 04/29/15 1418  Gross per 24 hour  Intake    160 ml  Output   1000 ml  Net   -840 ml    Exam:   General:  Pt is more alert, on NRB, NAD  Cardiovascular: Regular rate and rhythm, no rubs, no gallops  Respiratory: Diminished breath sounds throughout with crackles at bases   Abdomen: Soft, tender in lower abd quadrants, non distended   Data Reviewed: Basic Metabolic Panel:  Recent Labs Lab 04/25/15 0511 04/26/15 0531 04/27/15 0459 04/28/15 0107 04/29/15 0606  NA 141 141 139 139 142  K 3.7 4.0 3.8 3.5 3.4*  CL 101 101 98* 99* 101  CO2 32 30 31 31 31   GLUCOSE 124* 159* 125* 156* 105*  BUN 24* 21* 20 22* 22*  CREATININE 0.83 0.73 0.75 0.81 0.64  CALCIUM 8.9 8.7* 8.8* 8.4* 8.4*   CBC:  Recent Labs Lab 04/25/15 0511 04/26/15 0531 04/27/15 0459 04/28/15 0107 04/29/15 0606  WBC 20.9* 15.1* 19.8* 19.2* 20.7*  NEUTROABS  --   --  16.6*  --   --   HGB 11.9* 12.7 12.5 11.5* 11.3*  HCT 38.8 40.2 39.8 35.6* 36.2  MCV 86.2 84.1 84.3 82.2 83.6  PLT 392 364 374 318 338    Recent Results (from the past 240 hour(s))  Culture, Urine     Status: None   Collection Time: 04/21/15  8:02 PM  Result Value Ref Range Status   Specimen  Description URINE, CATHETERIZED  Final   Special Requests NONE  Final   Culture   Final    MULTIPLE SPECIES PRESENT, SUGGEST RECOLLECTION [facility]    Report Status 04/23/2015 FINAL  Final  Urine culture     Status: None   Collection Time: 04/25/15 12:39 AM  Result Value Ref Range Status   Specimen Description URINE, CATHETERIZED  Final   Special Requests Normal  Final   Culture   Final    NO GROWTH 1 DAY [facility]    Report Status 04/26/2015 FINAL  Final    Scheduled Meds: . acetaminophen  500 mg Oral TID  . amiodarone  200 mg Oral  q morning - 10a  . arformoterol  15 mcg Nebulization BID  . PULMICORT nebulizer   0.25 mg Nebulization BID  . calcitonin (salmon)  1 spray Alternating Nares QPM  . docusate sodium  100 mg Oral BID  . fentaNYL  25 mcg Transdermal Q72H  . FLUoxetine  20 mg Oral q morning - 10a  . furosemide  20 mg Oral Daily  . ipratropium  0.5 mg Nebulization TID  . levalbuterol  0.63 mg Nebulization Q6H WA  . levofloxacin  IV  500 mg Intravenous Q24H  . losartan  50 mg Oral q morning - 10a  . mirabegron ER  50 mg Oral QPM  . mirtazapine  15 mg Oral QHS  . polyethylene glycol  17 g Oral Daily  . PredniSONE  60 mg Oral QAC breakfast  . senna-docusate  1 tablet Oral QHS   Continuous Infusions: . heparin 1,300 Units/hr (04/28/15 1736)

## 2015-04-30 DIAGNOSIS — R0902 Hypoxemia: Secondary | ICD-10-CM | POA: Insufficient documentation

## 2015-04-30 DIAGNOSIS — Z7189 Other specified counseling: Secondary | ICD-10-CM

## 2015-04-30 DIAGNOSIS — Z515 Encounter for palliative care: Secondary | ICD-10-CM

## 2015-04-30 LAB — BASIC METABOLIC PANEL
Anion gap: 9 (ref 5–15)
BUN: 25 mg/dL — ABNORMAL HIGH (ref 6–20)
CHLORIDE: 100 mmol/L — AB (ref 101–111)
CO2: 31 mmol/L (ref 22–32)
CREATININE: 0.69 mg/dL (ref 0.44–1.00)
Calcium: 8.4 mg/dL — ABNORMAL LOW (ref 8.9–10.3)
GFR calc non Af Amer: >60 mL/min (ref >60)
Glucose, Bld: 106 mg/dL — ABNORMAL HIGH (ref 65–99)
Potassium: 3.4 mmol/L — ABNORMAL LOW (ref 3.5–5.1)
Sodium: 140 mmol/L (ref 135–145)

## 2015-04-30 LAB — CBC
HCT: 34.7 % — ABNORMAL LOW (ref 36.0–46.0)
HEMOGLOBIN: 10.5 g/dL — AB (ref 12.0–15.0)
MCH: 25.5 pg — AB (ref 26.0–34.0)
MCHC: 30.3 g/dL (ref 30.0–36.0)
MCV: 84.2 fL (ref 78.0–100.0)
PLATELETS: 315 10*3/uL (ref 150–400)
RBC: 4.12 MIL/uL (ref 3.87–5.11)
RDW: 15.9 % — ABNORMAL HIGH (ref 11.5–15.5)
WBC: 18.4 10*3/uL — ABNORMAL HIGH (ref 4.0–10.5)

## 2015-04-30 LAB — PROTIME-INR
INR: 1.43 (ref 0.00–1.49)
Prothrombin Time: 17.6 seconds — ABNORMAL HIGH (ref 11.6–15.2)

## 2015-04-30 LAB — HEPARIN LEVEL (UNFRACTIONATED): Heparin Unfractionated: 0.38 IU/mL (ref 0.30–0.70)

## 2015-04-30 MED ORDER — PREDNISONE 20 MG PO TABS
20.0000 mg | ORAL_TABLET | Freq: Every day | ORAL | Status: DC
  Administered 2015-05-01: 20 mg via ORAL
  Filled 2015-04-30: qty 1

## 2015-04-30 MED ORDER — FUROSEMIDE 10 MG/ML IJ SOLN
40.0000 mg | Freq: Once | INTRAMUSCULAR | Status: AC
  Administered 2015-04-30: 40 mg via INTRAVENOUS
  Filled 2015-04-30: qty 4

## 2015-04-30 MED ORDER — POTASSIUM CHLORIDE CRYS ER 20 MEQ PO TBCR
40.0000 meq | EXTENDED_RELEASE_TABLET | Freq: Once | ORAL | Status: AC
  Administered 2015-04-30: 40 meq via ORAL
  Filled 2015-04-30: qty 2

## 2015-04-30 NOTE — Care Management Important Message (Signed)
Important Message  Patient Details  Name: [name] MRN: [id number] Date of Birth: Mar 14, 1932   Medicare Important Message Given:  Yes    Haskell FlirtJamison, Carmen Tolliver 04/30/2015, 12:26 PMImportant Message  Patient Details  Name: [name] MRN: [id number] Date of Birth: Mar 14, 1932   Medicare Important Message Given:  Yes    Haskell FlirtJamison, Kaige Whistler 04/30/2015, 12:26 PM

## 2015-04-30 NOTE — Progress Notes (Signed)
ANTICOAGULATION CONSULT NOTE  Pharmacy Consult for heparin Indication: atrial fibrillation - bridge therapy  Allergies  Allergen Reactions  . Amoxicillin Other (See Comments)    Can't remember reaction  . Doxycycline Other (See Comments)    Can't remember reaction  . Penicillins Other (See Comments)    Can't remember reaction  . Sulfa Antibiotics Other (See Comments)    Can't remember reaction    Patient Measurements: Height: 5\' 3"  (160 cm) Weight: 187 lb 2.7 oz (84.9 kg) IBW/kg (Calculated) : 52.4 Heparin Dosing Weight: 70kg  Vital Signs: Temp: 98 F (36.7 C) (11/27 2015) Temp Source: Oral (11/27 2015) BP: 134/66 mmHg (11/27 2015) Pulse Rate: 71 (11/27 2015)  Labs:  Recent Labs  04/28/15 0107  04/28/15 2306 04/29/15 0606 04/30/15 0530  HGB 11.5*  --   --  11.3* 10.5*  HCT 35.6*  --   --  36.2 34.7*  PLT 318  --   --  338 315  LABPROT 16.3*  --   --  16.8* 17.6*  INR 1.30  --   --  1.35 1.43  HEPARINUNFRC <0.10*  < > 0.42 0.33 0.38  CREATININE 0.81  --   --  0.64 0.69  < > = values in this interval not displayed.  Estimated Creatinine Clearance: 56.9 mL/min (by C-G formula based on Cr of 0.69).   Medical History: Past Medical History  Diagnosis Date  . Irregular heart beat   . Hypertension   . Hypercholesteremia   . Depression   . Atrial fibrillation (HCC)   . Bowel obstruction (HCC)   . S/P cardiac pacemaker procedure, 02/03/13, Medtronic pacer 02/04/2013    sick sinus syndrome  . CVA (cerebral infarction)   . COPD (chronic obstructive pulmonary disease) (HCC)   . Tobacco abuse     quit 2009  . History of echocardiogram 05/21/2012    TEE; EF 55-60%; mild grade 1 atherosclerosis of prox descending aorta & distal aortic arch  . History of nuclear stress test 11/07/2009    dipyridamole; normal pattern of perfusion; negative for ischemia; low risk   . Carotid artery disease (HCC)     carotid duplex 10/2012 - bilat ICAs normla patency with mod tortuosity of  vessel, L vertebral artery demonstrated occlsuve disease    Assessment: [age] YOF presents with back pain with plans for kyphoplasty 11/25.  This had to be cancelled d/t hypoxia and pulmonary edema per CT chest. She is on warfarin for PAF (CHADS-VASc = 7), pharmacy asked to bridge with heparin gtt.   Home warfarin dose 2.5mg  daily, Last dose 11/19  Today, 04/30/2015  Heparin level remains therapeutic with infusion at 1300 units/hr  CBC-Hgb 10.5 and pltc WNL  No interruptions in infusion or bleeding report per RN  Goal of Therapy:  Heparin level 0.3-0.7 units/ml Monitor platelets by anticoagulation protocol: Yes   Plan:   Continue heparin infusion at 1300 units/hr  Daily CBC and heparin  F/u plans for kyphoplasty and anticoagulation plans peri-procedure.   Clance BollAmanda Chivonne Rascon, PharmD, BCPS Pager: [phone] 04/30/2015 8:04 AM

## 2015-04-30 NOTE — Progress Notes (Signed)
OT Cancellation Note  Patient Details Name: [name] MRN: [id number] DOB: 1932/01/21   Cancelled Treatment:    Reason Eval/Treat Not Completed: Medical issues which prohibited therapy   Pt to have procedure this day.  Please  RE ORDER OT and PT for pt s/p procedure if appropriate.  Thanks!   Alba CoryREDDING, Primitivo Merkey D 04/30/2015, 1:21 PM

## 2015-04-30 NOTE — Progress Notes (Signed)
Doctor please clarify diet.  PN states NPO x sips of liquids and meds in puree.  Pt able to swallow pills whole in Applesauce without difficulty.  However, continues to cough 90% of time when attemptng to drink liquids.  Do you want pt to only have thickened liquds?  Pt does not have a maintenance fluid, has been NPO and continues to c/o of extreme thirst.  Mouth care performed frequently.  Pt sats have remained low to mid 90's in past 8 hours on NRB 15liters.

## 2015-04-30 NOTE — Progress Notes (Signed)
Patient ID: [name], female   DOB: 10/31/1931, 81 y.o.   MRN: [id number]   [facility] PROGRESS NOTE  [name] MWN:[id number] DOB: 03/14/1932 DOA: 04/21/2015 PCP: Regina Mask, MD   Brief narrative:    81 y.o. Female, S/P cardiac pacemaker procedure, 02/03/13, Medtronic pacer, COPD, presented to [facility] ED with main concern of 3 weeks duration of progressively worsening mid area back pain, constant and throbbing, 7/10 in severity and worse with movement, with no specific alleviating factors. CT scan showed T6 acute compression fracture with 40% body loss.  Assessment/Plan:    Principal Problem:   Acute on chronic respiratory failure (HCC) with hypoxia  - secondary to COPD and with worsening hypoxia 11/25, CT chest obtained, was worrisome for aspiration PNA, started Clindamycin 11/25 and will continue same regimen  - persistent hypoxia, d/w son at bedside, relatively poor prognosis and very high risk for aspiration  - he will discuss with family to follow upon code status and thinks its his mother wish to be comfortable with no life support  - attempted to allow soft diet but pt again almost chocked, continue with aspiration precautions  - allow liquid diet, nectar thick and see how pt does  - on Prednisone, will continue tapering down  - continue BD's scheduled and as needed, brovana  - PCT consulted, appreciate assistance   Active Problems:   Leukocytosis - still elevated WBC, CT chest with ? Aspiration PNA - steroids are being tapered down - continue Clindamycin day #4, lost IV access so have to continue with PO clindamycin - d/w son at bedside of relatively poor prognosis - CBC in AM    High risk aspiration - SLP done, very high risk aspiration  - try liquid nectar thick diet     Hospital induced delirium  - persistent confusion, haldol as needed  - more clear and alert this AM    Accelerated HTN - SBP in 160's - on Lasix and Losartan - continue  hydralazine as needed for better BP control     PAF (paroxysmal atrial fibrillation) (HCC) - rate controlled  - Continue amiodarone.  - AC on hold in anticipation of possible procedure today  - will see with IR     Chronic diastolic congestive heart failure (HCC) - mild crackles on exam  - weight up from 82.9 kg --> 84 kg this AM - continue to monitor daily weights, strict I/O - continue Lasix     Hypokalemia, from lasix  - continue to supplement     S/P cardiac pacemaker procedure, 02/03/13, Medtronic pacer - stable     Chest pain - resolved     T6 compression fracture - bone scan with increased activity over the midthoracic spine at T6 consistent with compression fracture  - plan for kyphoplasty, ? Today     Obesity  - Body mass index is 31.84 kg/(m^2).  DVT prophylaxis - SCD's  Code Status: Full.  Family Communication:  plan of care discussed with son at bedside  Disposition Plan: SNF when hypoxia and mental status improves   IV access:  Peripheral IV  Procedures and diagnostic studies:    Dg Chest 2 View 04/21/2015  Stable mild cardiomegaly without overt pulmonary edema. No active pulmonary disease.  Dg Thoracic Spine 2 View 04/23/2015 Severe compression deformity of T6 vertebral body is noted consistent with compression fracture of indeterminate age. This corresponds in location to abnormality seen on nuclear medicine study.  Dg Lumbar Spine Complete 04/12/2015  There is moderate multilevel degenerative disc and facet joint change. There is no acute compression fracture. If the patient's symptoms persist, lumbar spine MRI may be useful. 2. Increase colonic stool burden diffusely may reflect constipation in the appropriate clinical setting.   Nm Bone Scan Whole Body 04/23/2015   Increased activity noted over the mid thoracic spine at approximately T6. This could correspond to previously questioned T6 compression fracture.  Dg Chest Port 1 View 04/22/2015 No  active disease.   Medical Consultants:  IR PCCM PCT  Other Consultants:  PT  IAnti-Infectives:   Rocephin (pt allergic to Rocephin), transition to Levaquin 11/23 --> changed to Clindamycin 11/25  Debbora Presto, MD  [facility] Pager [phone]  If 7PM-7AM, please contact night-coverage [email or web address] Password [facility] 04/30/2015, 1:43 PM   LOS: 5 days   HPI/Subjective: No events overnight.   Objective: Filed Vitals:   04/29/15 1923 04/29/15 2015 04/30/15 0634 04/30/15 0827  BP:  134/66    Pulse: 66 71    Temp:  98 F (36.7 C)    TempSrc:  Oral    Resp: 20 18    Height:      Weight:   84.9 kg (187 lb 2.7 oz)   SpO2: 98% 94%  98%    Intake/Output Summary (Last 24 hours) at 04/30/15 1343 Last data filed at 04/30/15 0451  Gross per 24 hour  Intake    458 ml  Output    650 ml  Net   -192 ml    Exam:   General:  Pt is more alert, on NRB, NAD  Cardiovascular: Regular rate and rhythm, no rubs, no gallops  Respiratory: Diminished breath sounds throughout with crackles at bases   Abdomen: Soft, tender in lower abd quadrants, non distended   Data Reviewed: Basic Metabolic Panel:  Recent Labs Lab 04/26/15 0531 04/27/15 0459 04/28/15 0107 04/29/15 0606 04/30/15 0530  NA 141 139 139 142 140  K 4.0 3.8 3.5 3.4* 3.4*  CL 101 98* 99* 101 100*  CO2 GLUCOSE 159* 125* 156* 105* 106*  BUN 21* 20 22* 22* 25*  CREATININE 0.73 0.75 0.81 0.64 0.69  CALCIUM 8.7* 8.8* 8.4* 8.4* 8.4*   CBC:  Recent Labs Lab 04/26/15 0531 04/27/15 0459 04/28/15 0107 04/29/15 0606 04/30/15 0530  WBC 15.1* 19.8* 19.2* 20.7* 18.4*  NEUTROABS  --  16.6*  --   --   --   HGB 12.7 12.5 11.5* 11.3* 10.5*  HCT 40.2 39.8 35.6* 36.2 34.7*  MCV 84.1 84.3 82.2 83.6 84.2  PLT 364 374 318 338 315    Recent Results (from the past 240 hour(s))  Culture, Urine     Status: None   Collection Time: 04/21/15  8:02 PM  Result Value Ref Range Status   Specimen Description URINE,  CATHETERIZED  Final   Special Requests NONE  Final   Culture   Final    MULTIPLE SPECIES PRESENT, SUGGEST RECOLLECTION Performed at [facility]    Report Status 04/23/2015 FINAL  Final  Urine culture     Status: None   Collection Time: 04/25/15 12:39 AM  Result Value Ref Range Status   Specimen Description URINE, CATHETERIZED  Final   Special Requests Normal  Final   Culture   Final    NO GROWTH 1 DAY Performed at [facility]    Report Status 04/26/2015 FINAL  Final    Scheduled Meds: . acetaminophen  500 mg Oral TID  .  amiodarone  200 mg Oral q morning - 10a  . arformoterol  15 mcg Nebulization BID  . PULMICORT nebulizer   0.25 mg Nebulization BID  . calcitonin (salmon)  1 spray Alternating Nares QPM  . docusate sodium  100 mg Oral BID  . fentaNYL  25 mcg Transdermal Q72H  . FLUoxetine  20 mg Oral q morning - 10a  . furosemide  20 mg Oral Daily  . ipratropium  0.5 mg Nebulization TID  . levalbuterol  0.63 mg Nebulization Q6H WA  . levofloxacin  IV  500 mg Intravenous Q24H  . losartan  50 mg Oral q morning - 10a  . mirabegron ER  50 mg Oral QPM  . mirtazapine  15 mg Oral QHS  . polyethylene glycol  17 g Oral Daily  . PredniSONE  60 mg Oral QAC breakfast  . senna-docusate  1 tablet Oral QHS   Continuous Infusions: . heparin 1,300 Units/hr (04/30/15 1214)

## 2015-04-30 NOTE — Consult Note (Signed)
Consultation Note Date: 04/30/2015   Patient Name: [name]  DOB: 11/29/1931  MRN: [id number]  Age / Sex: 81 y.o., female  PCP: Kaleen MaskWilson Oliver Elkins, MD Referring Physician: Dorothea OgleIskra M Myers, MD  Reason for Consultation: Establishing goals of care, Hospice Evaluation, Non pain symptom management, Pain control and Psychosocial/spiritual support    Clinical Assessment/Narrative: 81 y.o. female  has a past medical history of Irregular heart beat; Hypertension; Hypercholesteremia; Depression; Atrial fibrillation (HCC); Bowel obstruction (HCC); S/P cardiac pacemaker procedure, 02/03/13, Medtronic pacer (02/04/2013); CVA (cerebral infarction); COPD (chronic obstructive pulmonary disease) (HCC); Tobacco abuse; History of echocardiogram (05/21/2012); History of nuclear stress test (11/07/2009); and Carotid artery disease (HCC).   Presented with back pain for the past 3 weeks. Has been ongoing and has been progressively getting worse. Few days ago she presented to her [facility] at [facility] who obtained this thoracic spine CT that showed T4 acute compression fracture with 40% body loss. Back pain is so severe that supported controlled with by mouth medications. Patient presented today to emergency department due to severity of pain. In emergency room she was given morphine 4 mg 3 doses for total of 12 mg without improvement  Admitted for further evaluation and treatment.  In spite of medical interventions patient continues to fail to thrive.  Family is faced with advanced directive and anticipatory care need decsions.  Per family there is underlying depression and anxiety and reported physical and functional decline over the past many months.  .  This NP Lorinda CreedMary Larach reviewed medical records, received report from team, assessed the patient and then meet at the patient's bedside along with her son Fraser Dinreston  to discuss diagnosis,  prognosis, GOC, EOL wishes disposition and options.   A  discussion was had today regarding advanced directives.  Concepts specific to code status, artifical feeding and hydration, continued IV antibiotics and rehospitalization was had.  The difference between a aggressive medical intervention path  and a palliative comfort care path for this patient at this time was had.  Values and goals of care important to patient and family were attempted to be elicited.  Concept of Hospice and Palliative Care were discussed  Natural trajectory and expectations at EOL were discussed.  Questions and concerns addressed.  Hard Choices booklet left for review. Family encouraged to call with questions or concerns.  PMT will continue to support holistically.  Fraser Dinreston is anxious and tearful.  He is the only child and has little social support.  His father too is basically homebound and needs daily nursing care.  He is overwhelmed     Primary Decision Maker: son-Preston Klump    HCPOA: yes    SUMMARY OF RECOMMENDATIONS  -son continues to process "all this information" -continue current medical interventions, hopeful for improvement  -he will bring AD and Living will tomorrow for review and further discussion regarding plan of care -sips of water and ice chips as tolerated, family aware of aspiration risks   Code Status/Advance Care Planning:  Full code - strongly encouraged son to consider  DNR status knowing poor outcomes in similar pateitnts     Code Status Orders        Start     Ordered   04/21/15 2247  Full code   Continuous     04/21/15 2246    Advance Directive Documentation        Most Recent Value   Type of Advance Directive  Healthcare Power of Attorney, Living will   Pre-existing  out of facility DNR order (yellow form or pink MOST form)     "MOST" Form in Place?        Other Directives:Advanced Directive and Living Will-son to bring in tomorrow  Palliative Prophylaxis:     Aspiration, Bowel Regimen, Delirium Protocol, Frequent Pain Assessment and Turn Reposition   Psycho-social/Spiritual:   Support System: Fair   Desire for further Chaplaincy support:yes  Additional Recommendations: Education on Hospice and Grief/Bereavement Support  Prognosis: Pending life prolonging interventions decsions  Discharge Planning: Pending  Chief Complaint/ Primary Diagnoses: Present on Admission:  . PAF (paroxysmal atrial fibrillation) (HCC) . S/P cardiac pacemaker procedure, 02/03/13, Medtronic pacer . Hypokalemia . COPD with emphysema (HCC) . Acute diastolic congestive heart failure (HCC) . Chest pain . Vertebral compression fracture (HCC)  I have reviewed the medical record, interviewed the patient and family, and examined the patient. The following aspects are pertinent.  Past Medical History  Diagnosis Date  . Irregular heart beat   . Hypertension   . Hypercholesteremia   . Depression   . Atrial fibrillation (HCC)   . Bowel obstruction (HCC)   . S/P cardiac pacemaker procedure, 02/03/13, Medtronic pacer 02/04/2013    sick sinus syndrome  . CVA (cerebral infarction)   . COPD (chronic obstructive pulmonary disease) (HCC)   . Tobacco abuse     quit 2009  . History of echocardiogram 05/21/2012    TEE; EF 55-60%; mild grade 1 atherosclerosis of prox descending aorta & distal aortic arch  . History of nuclear stress test 11/07/2009    dipyridamole; normal pattern of perfusion; negative for ischemia; low risk   . Carotid artery disease (HCC)     carotid duplex 10/2012 - bilat ICAs normla patency with mod tortuosity of vessel, L vertebral artery demonstrated occlsuve disease   Social History   Social History  . Marital Status: Married    Spouse Name: N/A  . Number of Children: 1  . Years of Education: N/A   Occupational History  . retired    Social History Main Topics  . Smoking status: Former Smoker -- 1.00 packs/day for 38 years    Types:  Cigarettes    Start date: 06/02/1969    Quit date: 06/03/2007  . Smokeless tobacco: Never Used  . Alcohol Use: No  . Drug Use: No  . Sexual Activity: Not Asked   Other Topics Concern  . 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 Coronary artery disease Father   . Heart attack Brother   . Stomach cancer Paternal Grandmother    Scheduled Meds: . acetaminophen  500 mg Oral TID  . amiodarone  200 mg Oral q morning - 10a  . antiseptic oral rinse  7 mL Mouth Rinse BID  . arformoterol  15 mcg Nebulization BID  . budesonide (PULMICORT) nebulizer solution  0.25 mg Nebulization BID  . calcitonin (salmon)  1 spray Alternating Nares QPM  . clindamycin  300 mg Oral 4 times per day  . docusate sodium  100 mg Oral BID  . fentaNYL  25 mcg Transdermal Q72H  . FLUoxetine  20 mg Oral q morning - 10a  . furosemide  40 mg Intravenous Daily  . ipratropium  0.5 mg Nebulization TID  . levalbuterol  0.63 mg Nebulization TID  . losartan  50 mg Oral q morning - 10a  . mirtazapine  15  mg Oral QHS  . polyethylene glycol  17 g Oral Daily  . predniSONE  30 mg Oral Q breakfast  . senna-docusate  1 tablet Oral QHS  . sodium chloride  3 mL Intravenous Q12H  . sodium chloride  3 mL Intravenous Q12H   Continuous Infusions: . heparin 1,300 Units/hr (04/30/15 1214)   PRN Meds:.sodium chloride, acetaminophen **OR** acetaminophen, haloperidol lactate, hydrALAZINE, HYDROmorphone (DILAUDID) injection, ipratropium, levalbuterol, ondansetron **OR** ondansetron (ZOFRAN) IV, oxyCODONE, sodium chloride Medications Prior to Admission:  Prior to Admission medications   Medication Sig Start Date End Date Taking? Authorizing Provider  acetaminophen (TYLENOL) 500 MG tablet Take 500 mg by mouth at bedtime as needed for moderate pain. For pain to help sleep   Yes Historical Provider, MD  amiodarone (PACERONE) 200 MG tablet  Take 200 mg by mouth every morning.    Yes Historical Provider, MD  beta carotene w/minerals (OCUVITE) tablet Take 1 tablet by mouth every morning.    Yes Historical Provider, MD  calcitonin, salmon, (MIACALCIN/FORTICAL) 200 UNIT/ACT nasal spray Place 1 spray into alternate nostrils every evening.   Yes Historical Provider, MD  calcium-vitamin D (OSCAL WITH D) 500-200 MG-UNIT per tablet Take 1 tablet by mouth daily with breakfast.    Yes Historical Provider, MD  FLUoxetine (PROZAC) 20 MG capsule Take 20 mg by mouth every morning.    Yes Historical Provider, MD  furosemide (LASIX) 40 MG tablet TAKE 1 AND 1/2 TABLET BY MOUTH ONCE DAILY 02/07/15  Yes Mihai Croitoru, MD  HYDROcodone-acetaminophen (NORCO/VICODIN) 5-325 MG tablet Take 1 tablet by mouth every 8 (eight) hours.   Yes Historical Provider, MD  ipratropium-albuterol (DUONEB) 0.5-2.5 (3) MG/3ML SOLN Take 3 mLs by nebulization every 6 (six) hours as needed. 01/26/14  Yes Waymon Budge, MD  losartan (COZAAR) 50 MG tablet Take 50 mg by mouth every morning.    Yes Historical Provider, MD  mirabegron ER (MYRBETRIQ) 50 MG TB24 tablet Take 50 mg by mouth every evening.    Yes Historical Provider, MD  mirtazapine (REMERON) 15 MG tablet Take 15 mg by mouth at bedtime.   Yes Historical Provider, MD  warfarin (COUMADIN) 5 MG tablet Take 2.5 mg by mouth daily at 6 PM.    Yes Historical Provider, MD   Allergies  Allergen Reactions  . Amoxicillin Other (See Comments)    Can't remember reaction  . Doxycycline Other (See Comments)    Can't remember reaction  . Penicillins Other (See Comments)    Can't remember reaction  . Sulfa Antibiotics Other (See Comments)    Can't remember reaction    Review of Systems  Unable to perform ROS   Physical Exam  Constitutional: She appears well-developed.  HENT:  Head: Normocephalic and atraumatic.  Cardiovascular: Normal rate, regular rhythm and normal heart sounds.   Respiratory: She is in respiratory  distress.  diminished throughout lung fields  Neurological: She is alert.  Skin: Skin is warm and dry.  Psychiatric:  -poor insight into current medical situation, diverts all decisions to son    Vital Signs: BP 134/66 mmHg  Pulse 71  Temp(Src) 98 F (36.7 C) (Oral)  Resp 18  Ht  (1.6 m)  Wt 84.9 kg (187 lb 2.7 oz)  BMI 33.16 kg/m2  SpO2 98%  SpO2: SpO2: 98 % O2 Device:SpO2: 98 % O2 Flow Rate: .O2 Flow Rate (L/min): 15 L/min  IO: Intake/output summary:  Intake/Output Summary (Last 24 hours) at 04/30/15 1303 Last data filed at 04/30/15 0451  Gross per 24 hour  Intake    458 ml  Output    650 ml  Net   -192 ml    LBM: Last BM Date: 04/29/15 Baseline Weight: Weight: 84.369 kg (186 lb) Most recent weight: Weight: 84.9 kg (187 lb 2.7 oz)      Palliative Assessment/Data:    Additional Data Reviewed:  CBC:    Component Value Date/Time   WBC 18.4* 04/30/2015 0530   HGB 10.5* 04/30/2015 0530   HCT 34.7* 04/30/2015 0530   PLT 315 04/30/2015 0530   MCV 84.2 04/30/2015 0530   NEUTROABS 16.6* 04/27/2015 0459   LYMPHSABS 1.3 04/27/2015 0459   MONOABS 2.0* 04/27/2015 0459   EOSABS 0.0 04/27/2015 0459   BASOSABS 0.0 04/27/2015 0459   Comprehensive Metabolic Panel:    Component Value Date/Time   NA 140 04/30/2015 0530   K 3.4* 04/30/2015 0530   CL 100* 04/30/2015 0530   CO2 31 04/30/2015 0530   BUN 25* 04/30/2015 0530   CREATININE 0.69 04/30/2015 0530   CREATININE 1.03* 03/22/2015 1442   GLUCOSE 106* 04/30/2015 0530   CALCIUM 8.4* 04/30/2015 0530   AST 33 04/22/2015 0522   ALT 26 04/22/2015 0522   ALKPHOS 63 04/22/2015 0522   BILITOT 0.4 04/22/2015 0522   PROT 7.3 04/22/2015 0522   ALBUMIN 3.9 04/22/2015 0522   Discussed with Dr Izola Price  Time In: 1330 Time Out: 1500 Time Total: 90 min Greater than 50%  of this time was spent counseling and coordinating care related to the above assessment and plan.  Signed by: Lorinda Creed, NP  Canary Brim, NP   04/30/2015, 1:03 PM  Please contact Palliative Medicine Team phone at [phone] for questions and concerns.

## 2015-04-30 NOTE — Progress Notes (Signed)
Speech Language Pathology Treatment: Dysphagia  Patient Details Name: [name] MRN: [id number] DOB: February 08, 1932 Today's Date: 04/30/2015 Time: 4098-1191 SLP Time Calculation (min) (ACUTE ONLY): 17 min  Assessment / Plan / Recommendation Clinical Impression  F/u after 11/26 swallow assessment.  Pt continues to require supplemental 02 at 15L to maintain acceptable SP02.  She demonstrates mild throat-clearing during and after PO presentations, regardless of consistency presented.  Pt is alert, following commands, demonstrates adequate oral control/manipulation and consistent swallow response.  RR remained around 16/minute, compatible with adequate swallow/ventilatory pacing.  Per son, he met with Palliative medicine today.  Recommend proceeding with MBS next date to determine presence of dysphagia and impact on function; doing so may assist with decision-making for family with regard to Levasy.  D/W Dr. Doyle Askew, who agrees.  Continue meds in puree and ice chips for now.    HPI HPI: 81 y.o. Female, S/P cardiac pacemaker procedure, 02/03/13, Medtronic pacer, COPD, presented to [facility] ED with main concern of 3 weeks duration of progressively worsening mid area back pain, constant and throbbing, 7/10 in severity and worse with movement, with no specific alleviating factors. CT scan showed T6 acute compression fracture with 40% body loss. Pt with increased WBC and CT suspicious for aspiration pna - debris in the trachea.       SLP Plan  MBS     Recommendations  Diet recommendations:  (continue npo except meds, ice chips) Medication Administration: Whole meds with puree Postural Changes and/or Swallow Maneuvers: Seated upright 90 degrees              Oral Care Recommendations: Oral care QID Plan: MBS   [name] 04/30/2015, 3:32 PM  Rosemary Mossbarger L. Tivis Ringer, [place] CCC/SLP Pager [phone]

## 2015-05-01 ENCOUNTER — Inpatient Hospital Stay (HOSPITAL_COMMUNITY): Payer: Medicare Other

## 2015-05-01 DIAGNOSIS — R0600 Dyspnea, unspecified: Secondary | ICD-10-CM | POA: Insufficient documentation

## 2015-05-01 LAB — PROTIME-INR
INR: 1.36 (ref 0.00–1.49)
PROTHROMBIN TIME: 16.9 s — AB (ref 11.6–15.2)

## 2015-05-01 LAB — BASIC METABOLIC PANEL
Anion gap: 10 (ref 5–15)
BUN: 24 mg/dL — AB (ref 6–20)
CO2: 31 mmol/L (ref 22–32)
CREATININE: 0.59 mg/dL (ref 0.44–1.00)
Calcium: 8.3 mg/dL — ABNORMAL LOW (ref 8.9–10.3)
Chloride: 102 mmol/L (ref 101–111)
GFR calc Af Amer: >60 mL/min (ref >60)
GLUCOSE: 94 mg/dL (ref 65–99)
POTASSIUM: 3.7 mmol/L (ref 3.5–5.1)
Sodium: 143 mmol/L (ref 135–145)

## 2015-05-01 LAB — CBC
HCT: 36.7 % (ref 36.0–46.0)
HEMOGLOBIN: 11 g/dL — AB (ref 12.0–15.0)
MCH: 25.9 pg — AB (ref 26.0–34.0)
MCHC: 30 g/dL (ref 30.0–36.0)
MCV: 86.6 fL (ref 78.0–100.0)
PLATELETS: 340 10*3/uL (ref 150–400)
RBC: 4.24 MIL/uL (ref 3.87–5.11)
RDW: 16.2 % — AB (ref 11.5–15.5)
WBC: 16.6 10*3/uL — ABNORMAL HIGH (ref 4.0–10.5)

## 2015-05-01 LAB — HEPARIN LEVEL (UNFRACTIONATED): HEPARIN UNFRACTIONATED: 0.34 [IU]/mL (ref 0.30–0.70)

## 2015-05-01 MED ORDER — RESOURCE THICKENUP CLEAR PO POWD
ORAL | Status: DC | PRN
  Filled 2015-05-01: qty 125

## 2015-05-01 MED ORDER — MORPHINE SULFATE (PF) 2 MG/ML IV SOLN
1.0000 mg | INTRAVENOUS | Status: DC | PRN
  Administered 2015-05-02: 1 mg via INTRAVENOUS
  Filled 2015-05-01: qty 1

## 2015-05-01 MED ORDER — PREDNISONE 5 MG PO TABS
10.0000 mg | ORAL_TABLET | Freq: Every day | ORAL | Status: DC
  Administered 2015-05-02: 10 mg via ORAL
  Filled 2015-05-01: qty 2

## 2015-05-01 MED ORDER — LORAZEPAM 2 MG/ML IJ SOLN
1.0000 mg | Freq: Four times a day (QID) | INTRAMUSCULAR | Status: DC | PRN
Start: 2015-05-01 — End: 2015-05-02

## 2015-05-01 NOTE — Progress Notes (Signed)
Patient ID: Richarda OverlieShirley A Costin, female   DOB: 1932/05/25, 81 y.o.   MRN: [id number]   [facility] PROGRESS NOTE  Richarda OverlieShirley A Garr WUJ:[id number] DOB: 1932/05/25 DOA: 04/21/2015 PCP: Kaleen MaskELKINS,WILSON OLIVER, MD   Brief narrative:    81 y.o. Female, S/P cardiac pacemaker procedure, 02/03/13, Medtronic pacer, COPD, [facility] with main concern of 3 weeks duration of progressively worsening mid area back pain, constant and throbbing, 7/10 in severity and worse with movement, with no specific alleviating factors. CT scan showed T6 acute compression fracture with 40% body loss.  Assessment/Plan:    Principal Problem:   Acute on chronic respiratory failure (HCC) with hypoxia  - secondary to COPD and with worsening hypoxia 11/25, CT chest obtained, was worrisome for aspiration PNA, started Clindamycin 11/25 and will continue same regimen  - persistent hypoxia, d/w son at bedside, relatively poor prognosis and very high risk for aspiration  - confirmed DNR code status  - attempted to allow soft diet but pt again almost chocked, continue with aspiration precautions  - allow liquid diet, nectar thick and see how pt does  - on Prednisone, will continue tapering down  - continue BD's scheduled and as needed, brovana  - PCT consulted, appreciate assistance   Active Problems:   Leukocytosis - still elevated WBC, CT chest with ? Aspiration PNA - steroids are being tapered down - continue Clindamycin day #5, lost IV access so have to continue with PO clindamycin - d/w son at bedside of relatively poor prognosis - CBC in AM    High risk aspiration - SLP done, very high risk aspiration  - continue liquid nectar thick diet     Hospital induced delirium  - persistent confusion, haldol as needed  - more alert this AM    Accelerated HTN - SBP in 160's - on Lasix and Losartan - continue hydralazine as needed for better BP control     PAF (paroxysmal atrial fibrillation) (HCC) - rate  controlled  - Continue amiodarone.  - AC on hold in anticipation of possible procedure today  - will see with IR     Acute on Chronic diastolic congestive heart failure (HCC) - still with mild crackles on exam  - weight up from 82.9 kg --> 84 kg --> 82 kg this AM - continue to monitor daily weights, strict I/O - continue Lasix     Hypokalemia, from lasix  - continue to supplement     S/P cardiac pacemaker procedure, 02/03/13, Medtronic pacer - stable     Chest pain - resolved     T6 compression fracture - bone scan with increased activity over the midthoracic spine at T6 consistent with compression fracture  - unable to do kypohoplasty due to resp status and unable to wean off NRB    Obesity  - Body mass index is 31.84 kg/(m^2).  DVT prophylaxis - SCD's  Code Status: DNR Family Communication:  plan of care discussed with son at bedside  Disposition Plan: SNF when hypoxia and mental status improves   IV access:  Peripheral IV  Procedures and diagnostic studies:    Dg Chest 2 View 04/21/2015  Stable mild cardiomegaly without overt pulmonary edema. No active pulmonary disease.  Dg Thoracic Spine 2 View 04/23/2015 Severe compression deformity of T6 vertebral body is noted consistent with compression fracture of indeterminate age. This corresponds in location to abnormality seen on nuclear medicine study.  Dg Lumbar Spine Complete 04/12/2015 There is moderate multilevel degenerative disc and  facet joint change. There is no acute compression fracture. If the patient's symptoms persist, lumbar spine MRI may be useful. 2. Increase colonic stool burden diffusely may reflect constipation in the appropriate clinical setting.   Nm Bone Scan Whole Body 04/23/2015   Increased activity noted over the mid thoracic spine at approximately T6. This could correspond to previously questioned T6 compression fracture.  Dg Chest Port 1 View 04/22/2015 No active disease.   Medical  Consultants:  IR PCCM PCT  Other Consultants:  PT  IAnti-Infectives:   Rocephin (pt allergic to Rocephin), transition to Levaquin 11/23 --> changed to Clindamycin 11/25  Debbora Presto, MD  [facility] Pager [phone]  If 7PM-7AM, please contact night-coverage [email or web address] Password [facility] 05/01/2015, 4:44 PM   LOS: 6 days   HPI/Subjective: No events overnight.   Objective: Filed Vitals:   05/01/15 0843 05/01/15 0847 05/01/15 1428 05/01/15 1537  BP:   108/64   Pulse:   69   Temp:   97.7 F (36.5 C)   TempSrc:   Oral   Resp:   20   Height:      Weight:  82.2 kg (181 lb 3.5 oz)    SpO2: 86%  89% 91%    Intake/Output Summary (Last 24 hours) at 05/01/15 1644 Last data filed at 05/01/15 1300  Gross per 24 hour  Intake    104 ml  Output   1725 ml  Net  -1621 ml    Exam:   General:  Pt is more alert, on NRB, NAD  Cardiovascular: Regular rate and rhythm, no rubs, no gallops  Respiratory: Diminished breath sounds throughout with crackles at bases   Abdomen: Soft, tender in lower abd quadrants, non distended   Data Reviewed: Basic Metabolic Panel:  Recent Labs Lab 04/27/15 0459 04/28/15 0107 04/29/15 0606 04/30/15 0530 05/01/15 0457  NA 139 139 142 140 143  K 3.8 3.5 3.4* 3.4* 3.7  CL 98* 99* 101 100* 102  CO2 GLUCOSE 125* 156* 105* 106* 94  BUN 20 22* 22* 25* 24*  CREATININE 0.75 0.81 0.64 0.69 0.59  CALCIUM 8.8* 8.4* 8.4* 8.4* 8.3*   CBC:  Recent Labs Lab 04/27/15 0459 04/28/15 0107 04/29/15 0606 04/30/15 0530 05/01/15 0457  WBC 19.8* 19.2* 20.7* 18.4* 16.6*  NEUTROABS 16.6*  --   --   --   --   HGB 12.5 11.5* 11.3* 10.5* 11.0*  HCT 39.8 35.6* 36.2 34.7* 36.7  MCV 84.3 82.2 83.6 84.2 86.6  PLT 374 318 338 315 340    Recent Results (from the past 240 hour(s))  Culture, Urine     Status: None   Collection Time: 04/21/15  8:02 PM  Result Value Ref Range Status   Specimen Description URINE, CATHETERIZED  Final   Special  Requests NONE  Final   Culture   Final    MULTIPLE SPECIES PRESENT, SUGGEST RECOLLECTION Performed at [facility]    Report Status 04/23/2015 FINAL  Final  Urine culture     Status: None   Collection Time: 04/25/15 12:39 AM  Result Value Ref Range Status   Specimen Description URINE, CATHETERIZED  Final   Special Requests Normal  Final   Culture   Final    NO GROWTH 1 DAY Performed at [facility]    Report Status 04/26/2015 FINAL  Final    Scheduled Meds: . acetaminophen  500 mg Oral TID  . amiodarone  200 mg Oral q morning -  10a  . arformoterol  15 mcg Nebulization BID  . PULMICORT nebulizer   0.25 mg Nebulization BID  . calcitonin (salmon)  1 spray Alternating Nares QPM  . docusate sodium  100 mg Oral BID  . fentaNYL  25 mcg Transdermal Q72H  . FLUoxetine  20 mg Oral q morning - 10a  . furosemide  20 mg Oral Daily  . ipratropium  0.5 mg Nebulization TID  . levalbuterol  0.63 mg Nebulization Q6H WA  . levofloxacin  IV  500 mg Intravenous Q24H  . losartan  50 mg Oral q morning - 10a  . mirabegron ER  50 mg Oral QPM  . mirtazapine  15 mg Oral QHS  . polyethylene glycol  17 g Oral Daily  . PredniSONE  60 mg Oral QAC breakfast  . senna-docusate  1 tablet Oral QHS   Continuous Infusions: . heparin 1,300 Units/hr (05/01/15 1004)

## 2015-05-01 NOTE — Clinical Documentation Improvement (Addendum)
Hospitalist  (Please document any query response in the progress notes and discharge summary.  Response documented on the CDI BPA are not codeable because the CDI BPA is not part of the permanent medical record.  Query 1 of 2   (please scroll down) Conflicting documentation exists in the current medical record regarding the patient's diastolic heart failure.  Please clarify and document the ACUITY of the patient's diastolic heart failure monitored and treated this admission.   If the patient's diastolic heart failure transitioned this admission from chronic to acute on chronic, please include that information, also.   - Acute on Chronic Diastolic Heart Failure, requiring IV Lasix this admission  - Chronic Diastolic Heart Failure without exacerbation  - Other condition  - Unable to clinically determine  Clinical Information: PO Lasix given 04/22/15 - 04/25/15 IV Lasix given at variable doses from approx. 04/25/15 through the present. CXR 04/25/15 - "Question a degree of underlying congestive heart failure"  Query 2 of 2  Please document the Suspected, Likely or Known cause of the patient's T6 compression fracture, including any associated condition(s).   Please exercise your independent, professional judgment when responding. A specific answer is not anticipated or expected.   Thank You,  Jerral Ralphathy R Joslyn Ramos  RN BSN CCDS [phone] Health Information Management 

## 2015-05-01 NOTE — Progress Notes (Signed)
Daily Progress Note   Patient Name: [name]       Date: 05/01/2015 DOB: 1931-10-20  Age: 81 y.o. MRN#: [id number] Attending Physician: Dorothea Ogle, MD Primary Care Physician: [name], MD Admit Date: 04/21/2015  Reason for Consultation/Follow-up: Establishing goals of care, Hospice Evaluation, Inpatient hospice referral, Non pain symptom management and Psychosocial/spiritual support  Subjective:  -continued conversation regarding diagnosis prognosis, GOC, EOL wishes disposition and options.  -son brought living will for review, patient clearly staes a wish for full comfort and natural death if she is in an incurable situation  -we again discussed  code status, artifical feeding and hydration, continued IV antibiotics and rehospitalization was had.  The difference between a aggressive medical intervention path  and a palliative comfort care path for this patient at this time was had.  Values and goals of care important to patient and family were attempted to be elicited.  -Concept of Hospice and Palliative Care were discussed  - Natural trajectory and expectations at EOL were discussed.  Questions and concerns addressed.  Family encouraged to call with questions or concerns.  PMT will continue to support holistically.  -son is able to verbalize his desire to focus on comfort for his mother, understanding her limited prognosis.  Patient quickly desaturates (O2 into 80s) with Ventimask for oxygen delivery   -hope is for a hospice facility, with a de-escalation of life prolonging interventions on discahrge    Length of Stay: 6 days  Current Medications: Scheduled Meds:  . acetaminophen  500 mg Oral TID  . amiodarone  200 mg Oral q morning - 10a  . antiseptic oral  rinse  7 mL Mouth Rinse BID  . arformoterol  15 mcg Nebulization BID  . budesonide (PULMICORT) nebulizer solution  0.25 mg Nebulization BID  . calcitonin (salmon)  1 spray Alternating Nares QPM  . clindamycin  300 mg Oral 4 times per day  . docusate sodium  100 mg Oral BID  . fentaNYL  25 mcg Transdermal Q72H  . FLUoxetine  20 mg Oral q morning - 10a  . furosemide  40 mg Intravenous Daily  . ipratropium  0.5 mg Nebulization TID  . levalbuterol  0.63 mg Nebulization TID  . losartan  50 mg Oral q morning - 10a  . mirtazapine  15  mg Oral QHS  . polyethylene glycol  17 g Oral Daily  . predniSONE  20 mg Oral Q breakfast  . senna-docusate  1 tablet Oral QHS  . sodium chloride  3 mL Intravenous Q12H  . sodium chloride  3 mL Intravenous Q12H    Continuous Infusions: . heparin 1,300 Units/hr (05/01/15 1004)    PRN Meds: sodium chloride, acetaminophen **OR** acetaminophen, haloperidol lactate, hydrALAZINE, HYDROmorphone (DILAUDID) injection, ipratropium, levalbuterol, ondansetron **OR** ondansetron (ZOFRAN) IV, oxyCODONE, RESOURCE THICKENUP CLEAR, sodium chloride  Physical Exam: Physical Exam  Constitutional: She appears well-developed and well-nourished.  HENT:  Head: Normocephalic.  Cardiovascular: Normal rate, regular rhythm and normal heart sounds.   Pulmonary/Chest: She is in respiratory distress.  diminished BS throughout  Skin: Skin is warm and dry.  Psychiatric:  -VERY talkative today, pressured                Vital Signs: BP 108/64 mmHg  Pulse 69  Temp(Src) 97.7 F (36.5 C) (Oral)  Resp 20  Ht  (1.6 m)  Wt 82.2 kg (181 lb 3.5 oz)  BMI 32.11 kg/m2  SpO2 91% SpO2: SpO2: 91 % O2 Device: O2 Device: NRB O2 Flow Rate: O2 Flow Rate (L/min): 15 L/min  Intake/output summary:  Intake/Output Summary (Last 24 hours) at 05/01/15 1546 Last data filed at 05/01/15 9562  Gross per 24 hour  Intake    104 ml  Output    825 ml  Net   -721 ml   LBM: Last BM Date:  04/28/15 Baseline Weight: Weight: 84.369 kg (186 lb) Most recent weight: Weight: 82.2 kg (181 lb 3.5 oz)       Palliative Assessment/Data: Flowsheet Rows        Most Recent Value   Intake Tab    Referral Department  Hospitalist   Unit at Time of Referral  Cardiac/Telemetry Unit   Palliative Care Primary Diagnosis  Pulmonary   Date Notified  04/28/15   Palliative Care Type  New Palliative care   Reason for referral  Clarify Goals of Care   Date of Admission  04/21/15   Date first seen by Palliative Care  04/30/15   # of days Palliative referral response time  2 Day(s)   # of days IP prior to Palliative referral  7   Clinical Assessment    Psychosocial & Spiritual Assessment    Palliative Care Outcomes       Additional Data Reviewed: CBC    Component Value Date/Time   WBC 16.6* 05/01/2015 0457   RBC 4.24 05/01/2015 0457   HGB 11.0* 05/01/2015 0457   HCT 36.7 05/01/2015 0457   PLT 340 05/01/2015 0457   MCV 86.6 05/01/2015 0457   MCH 25.9* 05/01/2015 0457   MCHC 30.0 05/01/2015 0457   RDW 16.2* 05/01/2015 0457   LYMPHSABS 1.3 04/27/2015 0459   MONOABS 2.0* 04/27/2015 0459   EOSABS 0.0 04/27/2015 0459   BASOSABS 0.0 04/27/2015 0459    CMP     Component Value Date/Time   NA 143 05/01/2015 0457   K 3.7 05/01/2015 0457   CL 102 05/01/2015 0457   CO2 31 05/01/2015 0457   GLUCOSE 94 05/01/2015 0457   BUN 24* 05/01/2015 0457   CREATININE 0.59 05/01/2015 0457   CREATININE 1.03* 03/22/2015 1442   CALCIUM 8.3* 05/01/2015 0457   PROT 7.3 04/22/2015 0522   ALBUMIN 3.9 04/22/2015 0522   AST 33 04/22/2015 0522   ALT 26 04/22/2015 0522   ALKPHOS 63 04/22/2015  0522   BILITOT 0.4 04/22/2015 0522   GFRNONAA >60 05/01/2015 0457   GFRAA >60 05/01/2015 0457       Problem List:  Patient Active Problem List   Diagnosis Date Noted  . Hypoxia   . Palliative care encounter   . DNR (do not resuscitate) discussion   . Acute on chronic respiratory failure (HCC) 04/22/2015   . COPD exacerbation (HCC) 04/22/2015  . Respiratory distress   . Chest pain 04/21/2015  . Compression fracture 04/21/2015  . Vertebral compression fracture (HCC) 04/21/2015  . Pain in the chest   . COPD with emphysema (HCC) 08/12/2013  . Long QT interval 03/16/2013  . S/P cardiac pacemaker procedure, 02/03/13, Medtronic pacer 02/04/2013  . Sinus node dysfunction (HCC) 02/01/2013  . Malaise and fatigue 01/31/2013  . Symptomatic bradycardia 01/31/2013  . Atrial fibrillation with slow ventricular response (HCC) 01/31/2013  . Long term (current) use of anticoagulants 09/18/2012  . Pulmonary fibrosis (HCC) 05/23/2012  . Elevated LFTs 05/22/2012  . PAF (paroxysmal atrial fibrillation) (HCC) 05/17/2012  . Pulmonary edema 05/17/2012  . Acute diastolic congestive heart failure (HCC) 05/17/2012  . CAP (community acquired pneumonia) 05/17/2012  . Chronic respiratory failure with hypoxia (HCC) 05/17/2012  . Hypokalemia 05/17/2012     Palliative Care Assessment & Plan    1.Code Status:  DNRdocumented today        Code Status Orders        Start     Ordered   04/21/15 2247  Full code   Continuous     04/21/15 2246    Advance Directive Documentation        Most Recent Value   Type of Advance Directive  Healthcare Power of Attorney, Living will   Pre-existing out of facility DNR order (yellow form or pink MOST form)     "MOST" Form in Place?         2. Goals of Care/Additional Recommendations: Focus is comfort and dignity  Limitations on Scope of Treatment:   Once discharged to hospice facility  Avoid Hospitalization, Minimize Medications, Initiate Comfort Feeding, No Artificial Feeding, No Blood Transfusions, No Diagnostics, No Glucose Monitoring, No IV Antibiotics, No IV Fluids and No Lab Draws, continue with current oxygen delivery   Desire for further Chaplaincy support:no-community preacher at bedside     Psycho-social Needs: Education on Hospice and  Grief/Bereavement Support  3. Symptom Management:      1. Dyspnea: Morphine 1 mg IV every 1 hr prn       2.  Agitation: Ativan 1 mg IV every 6 hrs prn  4. Palliative Prophylaxis:   Aspiration, Bowel Regimen, Delirium Protocol, Frequent Pain Assessment, Oral Care and Turn Reposition  5. Prognosis: < 2 weeks  6. Discharge Planning:  Hospice facility   Care plan was discussed with Dr Izola PriceMyers  Thank you for allowing the Palliative Medicine Team to assist in the care of this patient.   Time In: 1500 Time Out: 1615 Total Time 75 min Prolonged Time Billed  no         Canary BrimMary W Rossy Virag, NP  05/01/2015, 3:46 PM  Please contact Palliative Medicine Team phone at [phone] for questions and concerns.

## 2015-05-01 NOTE — Progress Notes (Signed)
Assessment / Plan / Recommendation  CHL IP CLINICAL IMPRESSIONS 05/01/2015  Therapy Diagnosis Mild oral phase dysphagia;Mild pharyngeal phase dysphagia  Clinical Impression  Mild oropharyngeal sensorimotor dysphagia resulting in silent aspiration of thin.  Oral deficits characterized by decreased bolus cohesion and premature spillage of boluses into pharynx.  Pharyngeal swallow was mildly weak resulting in impaired laryngeal closure allowing SILENT aspiration of thin liquids via straw and penetration to cords via tsp.  Nectar liquids were minimally penetrated and cue cough/throat clear helped clear.  Increased viscocity of liquids results in more residuals- and therefore nectar may be better tolerated.    She benefited from verbal cue to swallow x1 as nectar liquids pooled in pharynx - indicating pharyngeal sensory deficits.  Mild vallecular/tongue base residuals noted without pt sensation - dry swallows helpful to decrease.    Of note, pt was leaning forward in tripod position during entire MBS which may have negatively impacted her swallowing including esophageal clearance.    She denies coughing with intake or dysphagia but her respiratory status and mild dysphagia place her at chronic risk.  Recommend consider advancement of diet to dys1/nectar with strict aspiration precautions.    Hopeful for continued dietary advancement as pt's respiratory status improves.        Impact on safety and function Moderate aspiration risk      CHL IP TREATMENT RECOMMENDATION 05/01/2015  Treatment Recommendations Therapy as outlined in treatment plan below     Prognosis 05/01/2015  Prognosis for Safe Diet Advancement Fair  Barriers to Reach Goals Other (Comment)  Barriers/Prognosis Comment --    CHL IP DIET RECOMMENDATION 05/01/2015  SLP Diet Recommendations Dysphagia 1 (Puree) solids;Nectar thick liquid  Liquid Administration via Cup;Straw  Medication Administration Crushed with puree   Compensations Slow rate;Small sips/bites  Postural Changes Remain semi-upright after after feeds/meals (Comment);Seated upright at 90 degrees      Donavan Burnetamara Maanasa Aderhold, [place] [facility] SLP [phone]

## 2015-05-01 NOTE — Clinical Social Work Note (Signed)
Clinical Social Work Assessment  Patient Details  Name: Regina OverlieShirley A Gilchrest MRN: [id number] Date of Birth: 1931-08-02  Date of referral:  05/01/15               Reason for consult:  Discharge Planning                Permission sought to share information with:  Family Supports Permission granted to share information::     Name::     Bosie Helperreston Escorcia  Agency::     Relationship::  son  Contact Information:  [phone]  Housing/Transportation Living arrangements for the past 2 months:  Skilled Nursing Facility Source of Information:  Adult Children Patient Interpreter Needed:  None Criminal Activity/Legal Involvement Pertinent to Current Situation/Hospitalization:  No - Comment as needed Significant Relationships:  Adult Children Lives with:  Self Do you feel safe going back to the place where you live?  No Need for family participation in patient care:  Yes (Comment)  Care giving concerns:  Pt admitted from home. Pt had displayed progressive decline in the hospital. PMT GOC held with pt son and recommendation for residential hospice.   Social Worker assessment / plan:  CSW received referral for residential hospice and that [facility].   CSW spoke with pt son, Fraser Dinreston via telephone. CSW introduced self and explained role. CSW discussed notification from PMT NP, Lorinda CreedMary Larach that pt son agreeable to residential hospice and [facility]. [facility]. CS[facility] and pt son expressed understanding. CSW discussed with pt son that CSW will follow up tomorrow once [facility] reviews pt information and notifies CSW of availability.   [facility], Forrestine Himva Davis.   CSW to continue to follow to assist with pt transition to residential hospice.   Employment status:  Retired Database administratornsurance information:  Managed Medicare PT Recommendations:  Not assessed at this time Information /  Referral to community resources:  Other (Comment Required) ([facility])  Patient/Family's Response to care:  Per chart, pt oriented to person. Pt son coping as well as to be anticipated given pt rapid decline. Pt son agreeable to residential hospice for comfort care.   Patient/Family's Understanding of and Emotional Response to Diagnosis, Current Treatment, and Prognosis:  Pt son displayed understanding surrounding pt diagnosis and prognosis. Pt son aware pt limited prognosis and wanting pt to remain comfortable at this time.   Emotional Assessment Appearance:  Appears stated age Attitude/Demeanor/Rapport:  Unable to Assess Affect (typically observed):  Unable to Assess Orientation:  Oriented to Self Alcohol / Substance use:  Not Applicable Psych involvement (Current and /or in the community):  No (Comment)  Discharge Needs  Concerns to be addressed:  Discharge Planning Concerns Readmission within the last 30 days:  No Current discharge risk:  Terminally ill Barriers to Discharge:  Other ([facility] at 5:14 pm)   Orson EvaKIDD, Stephanie Littman A, LCSW 05/01/2015, 6:16 PM  [phone]

## 2015-05-01 NOTE — Progress Notes (Signed)
PT Cancellation Note  Patient Details Name: [name] MRN: [id number] DOB: [name]   Cancelled Treatment:     Chart reviewed, OT note of 11/28 noted. Appears KP is on hold, patient has declined  And is on NRB mask. PALLIATIVE CARE INVOLVED RE: GOC. WILL  DISCONTINUE PT DUE TO DECLINE. PLEASE REORDER IF PATIENT IMPROVES well enough to mobilize.    [name], [name] 05/01/2015, 7:33 AM Blanchard KelchKaren Ceyda Peterka PT [phone]

## 2015-05-01 NOTE — Progress Notes (Signed)
ANTICOAGULATION CONSULT NOTE  Pharmacy Consult for heparin Indication: atrial fibrillation - bridge therapy  Allergies  Allergen Reactions  . Amoxicillin Other (See Comments)    Can't remember reaction  . Doxycycline Other (See Comments)    Can't remember reaction  . Penicillins Other (See Comments)    Can't remember reaction  . Sulfa Antibiotics Other (See Comments)    Can't remember reaction    Patient Measurements: Height: 5\' 3"  (160 cm) Weight:  (couldn't get the weight ,bed have error  on it) IBW/kg (Calculated) : 52.4 Heparin Dosing Weight: 70kg   Vital Signs: Temp: 97.5 F (36.4 C) (11/29 0559) Temp Source: Oral (11/29 0559) BP: 136/59 mmHg (11/29 0559) Pulse Rate: 74 (11/29 0559)  Labs:  Recent Labs  04/29/15 0606 04/30/15 0530 05/01/15 0457  HGB 11.3* 10.5* 11.0*  HCT 36.2 34.7* 36.7  PLT 338 315 340  LABPROT 16.8* 17.6* 16.9*  INR 1.35 1.43 1.36  HEPARINUNFRC 0.33 0.38 0.34  CREATININE 0.64 0.69 0.59    Estimated Creatinine Clearance: 56.9 mL/min (by C-G formula based on Cr of 0.59).   Medical History: Past Medical History  Diagnosis Date  . Irregular heart beat   . Hypertension   . Hypercholesteremia   . Depression   . Atrial fibrillation (HCC)   . Bowel obstruction (HCC)   . S/P cardiac pacemaker procedure, 02/03/13, Medtronic pacer 02/04/2013    sick sinus syndrome  . CVA (cerebral infarction)   . COPD (chronic obstructive pulmonary disease) (HCC)   . Tobacco abuse     quit 2009  . History of echocardiogram 05/21/2012    TEE; EF 55-60%; mild grade 1 atherosclerosis of prox descending aorta & distal aortic arch  . History of nuclear stress test 11/07/2009    dipyridamole; normal pattern of perfusion; negative for ischemia; low risk   . Carotid artery disease (HCC)     carotid duplex 10/2012 - bilat ICAs normla patency with mod tortuosity of vessel, L vertebral artery demonstrated occlsuve disease    Assessment: [age] YOF presents with back  pain with plans for kyphoplasty 11/25.  This had to be cancelled d/t hypoxia and pulmonary edema per CT chest. She is on warfarin for PAF (CHADS-VASc = 7), pharmacy asked to bridge with heparin gtt.   Home warfarin dose 2.5mg  daily, Last dose 11/19  Today, 05/01/2015  Heparin level remains therapeutic with infusion at 1300 units/hr  CBC-Hgb 11 and pltc WNL  No interruptions in infusion or bleeding report per RN  Goal of Therapy:  Heparin level 0.3-0.7 units/ml Monitor platelets by anticoagulation protocol: Yes   Plan:   Continue heparin infusion at 1300 units/hr  Daily CBC and heparin  F/u plans for kyphoplasty and anticoagulation plans peri-procedure.   Clance BollAmanda Dnyla Antonetti, PharmD, BCPS Pager: [phone] 05/01/2015 7:07 AM

## 2015-05-02 LAB — BASIC METABOLIC PANEL
Anion gap: 10 (ref 5–15)
BUN: 25 mg/dL — ABNORMAL HIGH (ref 6–20)
CALCIUM: 8.8 mg/dL — AB (ref 8.9–10.3)
CO2: 30 mmol/L (ref 22–32)
CREATININE: 0.72 mg/dL (ref 0.44–1.00)
Chloride: 103 mmol/L (ref 101–111)
GFR calc non Af Amer: >60 mL/min (ref >60)
Glucose, Bld: 114 mg/dL — ABNORMAL HIGH (ref 65–99)
Potassium: 3.8 mmol/L (ref 3.5–5.1)
SODIUM: 143 mmol/L (ref 135–145)

## 2015-05-02 LAB — CBC
HCT: 37 % (ref 36.0–46.0)
Hemoglobin: 11.2 g/dL — ABNORMAL LOW (ref 12.0–15.0)
MCH: 25.9 pg — AB (ref 26.0–34.0)
MCHC: 30.3 g/dL (ref 30.0–36.0)
MCV: 85.6 fL (ref 78.0–100.0)
PLATELETS: 338 10*3/uL (ref 150–400)
RBC: 4.32 MIL/uL (ref 3.87–5.11)
RDW: 16 % — ABNORMAL HIGH (ref 11.5–15.5)
WBC: 18.2 10*3/uL — AB (ref 4.0–10.5)

## 2015-05-02 LAB — PROTIME-INR
INR: 1.36 (ref 0.00–1.49)
PROTHROMBIN TIME: 16.9 s — AB (ref 11.6–15.2)

## 2015-05-02 LAB — HEPARIN LEVEL (UNFRACTIONATED): Heparin Unfractionated: 0.35 IU/mL (ref 0.30–0.70)

## 2015-05-02 MED ORDER — HYDROMORPHONE HCL 1 MG/ML IJ SOLN: 0.5000 mg | INTRAMUSCULAR | Status: AC | PRN

## 2015-05-02 MED ORDER — FUROSEMIDE 40 MG PO TABS: 40.0000 mg | ORAL_TABLET | Freq: Every day | ORAL | Status: AC

## 2015-05-02 MED ORDER — SENNOSIDES-DOCUSATE SODIUM 8.6-50 MG PO TABS: 1.0000 | ORAL_TABLET | Freq: Every day | ORAL | Status: AC

## 2015-05-02 MED ORDER — LORAZEPAM 2 MG/ML IJ SOLN: 1.0000 mg | Freq: Four times a day (QID) | INTRAMUSCULAR | Status: AC | PRN

## 2015-05-02 MED ORDER — CLINDAMYCIN HCL 300 MG PO CAPS: 300.0000 mg | ORAL_CAPSULE | Freq: Four times a day (QID) | ORAL | Status: AC

## 2015-05-02 MED ORDER — ARFORMOTEROL TARTRATE 15 MCG/2ML IN NEBU: 15.0000 ug | INHALATION_SOLUTION | Freq: Two times a day (BID) | RESPIRATORY_TRACT | Status: AC

## 2015-05-02 MED ORDER — FENTANYL 25 MCG/HR TD PT72: 25.0000 ug | MEDICATED_PATCH | TRANSDERMAL | Status: AC

## 2015-05-02 MED ORDER — DOCUSATE SODIUM 100 MG PO CAPS: 100.0000 mg | ORAL_CAPSULE | Freq: Two times a day (BID) | ORAL | Status: AC

## 2015-05-02 MED ORDER — HALOPERIDOL LACTATE 5 MG/ML IJ SOLN: 2.5000 mg | Freq: Four times a day (QID) | INTRAMUSCULAR | Status: AC | PRN

## 2015-05-02 MED ORDER — POLYETHYLENE GLYCOL 3350 17 G PO PACK: 17.0000 g | PACK | Freq: Every day | ORAL | Status: AC

## 2015-05-02 MED ORDER — CLINDAMYCIN HCL 300 MG PO CAPS: 300.0000 mg | ORAL_CAPSULE | Freq: Four times a day (QID) | ORAL | Status: DC

## 2015-05-02 MED ORDER — BUDESONIDE 0.25 MG/2ML IN SUSP: 0.2500 mg | Freq: Two times a day (BID) | RESPIRATORY_TRACT | Status: AC

## 2015-05-02 MED ORDER — OXYCODONE HCL 5 MG PO TABS: 5.0000 mg | ORAL_TABLET | ORAL | Status: AC | PRN

## 2015-05-02 NOTE — Clinical Documentation Improvement (Signed)
Hospitalist  (please document any query response in the progress notes and discharge summary.  Responses documented in the CDI BPA are not codeable because CDI BPA's are not part of the permanent medical record.  Please document the Suspected, Likely or Known cause of the patient's T6 compression fracture, including any associated condition(s).    Please exercise your independent, professional judgment when responding. A specific answer is not anticipated or expected.   Thank You,  Jerral Ralphathy R Jackee Glasner  RN BSN CCDS [phone] Health Information Management [place]

## 2015-05-02 NOTE — Progress Notes (Signed)
CSW received [facility], Forrestine Himva Davis that [facility].  CSW notified MD.   CSW facilitated [facility] needs [facility] including contacting [facility], [facility], discussing with pt and pt son at bedside and providing support, providing RN phone number to call report, and arranging [facility].   No further social work needs identified at this time.  CSW signing off.   Loletta SpecterSuzanna Kidd, MSW, LCSW Clinical Social Work [phone]

## 2015-05-02 NOTE — Progress Notes (Signed)
[facility]. Spoke with Reynolds AmericanStephanie RN. [facility]

## 2015-05-02 NOTE — Discharge Summary (Addendum)
Physician Discharge Summary  [name] WJX:[id number] DOB: 01-25-1932 DOA: 04/21/2015  PCP: Regina Mask, MD  Admit date: 04/21/2015 Discharge date: 05/02/2015  Recommendations for Outpatient Follow-up:  Pt will be discharged to [facility] Please allow comfort feeding if pt desires Also please note that coumadin has been stopped as it was determined that focus is more of pt's wish and would try to avoid blood work to ensure comfort  Please continue Clindamycin for 4 more days post discharge   Discharge Diagnoses:  Principal Problem:   Acute on chronic respiratory failure (HCC) Active Problems:   PAF (paroxysmal atrial fibrillation) (HCC)   Acute diastolic congestive heart failure (HCC)   Hypokalemia   S/P cardiac pacemaker procedure, 02/03/13, Medtronic pacer   COPD with emphysema (HCC)   Chest pain   Compression fracture   Vertebral compression fracture (HCC)   Pain in the chest   COPD exacerbation (HCC)   Respiratory distress   Hypoxia   Palliative care encounter   DNR (do not resuscitate) discussion   Dyspnea  Discharge Condition: Stable  Diet recommendation: Full liquid diet and comfort feeding as pt able to tolerate    Brief narrative:    81 y.o. Female, S/P cardiac pacemaker procedure, 02/03/13, Medtronic pacer, COPD, presented to [facility] ED with main concern of 3 weeks duration of progressively worsening mid area back pain, constant and throbbing, 7/10 in severity and worse with movement, with no specific alleviating factors. CT scan showed T6 acute compression fracture with 40% body loss.  Assessment/Plan:    Principal Problem:  Acute on chronic respiratory failure (HCC) with hypoxia  - secondary to COPD and with worsening hypoxia 11/25, CT chest obtained, was worrisome for aspiration PNA, started Clindamycin 11/25 and will continue same regimen  - also secondary to acute on chronic diastolic CHF - persistent hypoxia, d/w son at bedside,  relatively poor prognosis and very high risk for aspiration  - confirmed DNR code status  - attempted to allow soft diet but pt again almost chocked, continue with aspiration precautions  - allow liquid diet, nectar thick and see how pt does, advance if pt desires   - pt has completed course of Prednisone while inpatient  - continue BD's scheduled and as needed, brovana  - PCT consulted, appreciate assistance  - plan to discharge to [facility]   Active Problems:  Leukocytosis - still elevated WBC, CT chest with ? Aspiration PNA - steroids taper has been completed  - continue Clindamycin day #6, and continue for 4 more days post discharge  - d/w son at bedside, he is in agreement with [facility] placement    High risk aspiration - SLP done, very high risk aspiration  - continue liquid nectar thick diet    Hospital induced delirium  - persistent confusion, haldol as needed  - more alert this AM   Accelerated HTN - reasonably stable on Lasix    PAF (paroxysmal atrial fibrillation) (HCC) - rate controlled  - Continue amiodarone.  - AC stopped in an effort to ensure comfort    Acute on Chronic diastolic congestive heart failure (HCC) - no crackles on exam this AM - weight up from 82.9 kg --> 84 kg --> 82 kg--> 79.5 kg this AM - continue Lasix    Hypokalemia, from lasix  - supplemented and WNL this AM   S/P cardiac pacemaker procedure, 02/03/13, Medtronic pacer - stable    Chest pain - resolved    T6 compression fracture possibly  from injury after fall  - bone scan with increased activity over the midthoracic spine at T6 consistent with compression fracture  - this is causing significant immobility and functional quadriplegia, pt mostly bed bound  - unable to do kypohoplasty due to resp status and unable to wean off NRB   Obesity  - Body mass index is 31.84 kg/(m^2).  Code Status: DNR Family Communication: plan of care discussed with son at  bedside  Disposition Plan: [facility]   IV access:  Peripheral IV  Procedures and diagnostic studies:   Dg Chest 2 View 04/21/2015 Stable mild cardiomegaly without overt pulmonary edema. No active pulmonary disease.  Dg Thoracic Spine 2 View 04/23/2015 Severe compression deformity of T6 vertebral body is noted consistent with compression fracture of indeterminate age. This corresponds in location to abnormality seen on nuclear medicine study.  Dg Lumbar Spine Complete 04/12/2015 There is moderate multilevel degenerative disc and facet joint change. There is no acute compression fracture. If the patient's symptoms persist, lumbar spine MRI may be useful. 2. Increase colonic stool burden diffusely may reflect constipation in the appropriate clinical setting.   Nm Bone Scan Whole Body 04/23/2015 Increased activity noted over the mid thoracic spine at approximately T6. This could correspond to previously questioned T6 compression fracture.  Dg Chest Port 1 View 04/22/2015 No active disease.   Medical Consultants:  IR PCCM PCT  Other Consultants:  PT  IAnti-Infectives:   Rocephin (pt allergic to Rocephin), transition to Levaquin 11/23 --> changed to Clindamycin 11/25  [name], MD [facility] Pager [phone]       Discharge Exam: Filed Vitals:   05/01/15 2205 05/02/15 0502  BP: 123/50 132/56  Pulse: 74 74  Temp: 97.7 F (36.5 C) 98.3 F (36.8 C)  Resp: 20 20   Filed Vitals:   05/01/15 2126 05/01/15 2205 05/02/15 0502 05/02/15 0913  BP:  123/50 132/56   Pulse:  74 74   Temp:  97.7 F (36.5 C) 98.3 F (36.8 C)   TempSrc:  Oral Oral   Resp:  20 20   Height:      Weight:   79.5 kg (175 lb 4.3 oz)   SpO2: 90% 93% 91% 90%    General: Pt is alert, not in acute distress Cardiovascular: Regular rate and rhythm, no rubs, no gallops Respiratory: Clear to auscultation bilaterally, no wheezing, diminished breath sounds at bases   Abdominal: Soft, non tender, non distended, bowel sounds +, no guarding  Discharge Instructions  Discharge Instructions    Diet - low sodium heart healthy    Complete by:  As directed      Increase activity slowly    Complete by:  As directed             Medication List    STOP taking these medications        calcium-vitamin D 500-200 MG-UNIT tablet  Commonly known as:  OSCAL WITH D     HYDROcodone-acetaminophen 5-325 MG tablet  Commonly known as:  NORCO/VICODIN     losartan 50 MG tablet  Commonly known as:  COZAAR     MYRBETRIQ 50 MG Tb24 tablet  Generic drug:  mirabegron ER     warfarin 5 MG tablet  Commonly known as:  COUMADIN      TAKE these medications        acetaminophen 500 MG tablet  Commonly known as:  TYLENOL  Take 500 mg by mouth at bedtime as needed for moderate pain.  For pain to help sleep     amiodarone 200 MG tablet  Commonly known as:  PACERONE  Take 200 mg by mouth every morning.     arformoterol 15 MCG/2ML Nebu  Commonly known as:  BROVANA  Take 2 mLs (15 mcg total) by nebulization 2 (two) times daily.     beta carotene w/minerals tablet  Take 1 tablet by mouth every morning.     budesonide 0.25 MG/2ML nebulizer solution  Commonly known as:  PULMICORT  Take 2 mLs (0.25 mg total) by nebulization 2 (two) times daily.     calcitonin (salmon) 200 UNIT/ACT nasal spray  Commonly known as:  MIACALCIN/FORTICAL  Place 1 spray into alternate nostrils every evening.     clindamycin 300 MG capsule  Commonly known as:  CLEOCIN  Take 1 capsule (300 mg total) by mouth every 6 (six) hours.     docusate sodium 100 MG capsule  Commonly known as:  COLACE  Take 1 capsule (100 mg total) by mouth 2 (two) times daily.     fentaNYL 25 MCG/HR patch  Commonly known as:  DURAGESIC - dosed mcg/hr  Place 1 patch (25 mcg total) onto the skin every 3 (three) days.     FLUoxetine 20 MG capsule  Commonly known as:  PROZAC  Take 20 mg by mouth every morning.      furosemide 40 MG tablet  Commonly known as:  LASIX  Take 1 tablet (40 mg total) by mouth daily.     haloperidol lactate 5 MG/ML injection  Commonly known as:  HALDOL  Inject 0.5 mLs (2.5 mg total) into the vein every 6 (six) hours as needed.     HYDROmorphone 1 MG/ML injection  Commonly known as:  DILAUDID  Inject 0.5-1 mLs (0.5-1 mg total) into the vein every 4 (four) hours as needed for severe pain.     ipratropium-albuterol 0.5-2.5 (3) MG/3ML Soln  Commonly known as:  DUONEB  Take 3 mLs by nebulization every 6 (six) hours as needed.     LORazepam 2 MG/ML injection  Commonly known as:  ATIVAN  Inject 0.5 mLs (1 mg total) into the vein every 6 (six) hours as needed for anxiety.     mirtazapine 15 MG tablet  Commonly known as:  REMERON  Take 15 mg by mouth at bedtime.     oxyCODONE 5 MG immediate release tablet  Commonly known as:  Oxy IR/ROXICODONE  Take 1-2 tablets (5-10 mg total) by mouth every 4 (four) hours as needed for moderate pain.     polyethylene glycol packet  Commonly known as:  MIRALAX / GLYCOLAX  Take 17 g by mouth daily.     senna-docusate 8.6-50 MG tablet  Commonly known as:  Senokot-S  Take 1 tablet by mouth at bedtime.            Follow-up Information    Follow up with Regina Mask, MD.   Specialty:  Family Medicine   Why:  As needed   Contact information:   [address] [place] [address] [phone]        The results of significant diagnostics from this hospitalization (including imaging, microbiology, ancillary and laboratory) are listed below for reference.     Microbiology: Recent Results (from the past 240 hour(s))  Urine culture     Status: None   Collection Time: 04/25/15 12:39 AM  Result Value Ref Range Status   Specimen Description URINE, CATHETERIZED  Final   Special Requests  Normal  Final   Culture   Final    NO GROWTH 1 DAY Performed at [facility]    Report Status 04/26/2015 FINAL   Final     Labs: Basic Metabolic Panel:  Recent Labs Lab 04/28/15 0107 04/29/15 0606 04/30/15 0530 05/01/15 0457 05/02/15 0609  NA 139 142 140 143 143  K 3.5 3.4* 3.4* 3.7 3.8  CL 99* 101 100* 102 103  CO2 GLUCOSE 156* 105* 106* 94 114*  BUN 22* 22* 25* 24* 25*  CREATININE 0.81 0.64 0.69 0.59 0.72  CALCIUM 8.4* 8.4* 8.4* 8.3* 8.8*   CBC:  Recent Labs Lab 04/27/15 0459 04/28/15 0107 04/29/15 0606 04/30/15 0530 05/01/15 0457 05/02/15 0609  WBC 19.8* 19.2* 20.7* 18.4* 16.6* 18.2*  NEUTROABS 16.6*  --   --   --   --   --   HGB 12.5 11.5* 11.3* 10.5* 11.0* 11.2*  HCT 39.8 35.6* 36.2 34.7* 36.7 37.0  MCV 84.3 82.2 83.6 84.2 86.6 85.6  PLT 374 318 338 315 340 338   SIGNED: Time coordinating discharge:  30 minutes  MAGICK-Ercell Razon, MD  [facility] 05/02/2015, 11:03 AM Pager [phone]  If 7PM-7AM, please contact night-coverage [email or web address] Password [email or web address]

## 2015-05-02 NOTE — Progress Notes (Signed)
   05/02/15 1500  Clinical Encounter Type  Visited With Health care provider (MSW)  Visit Type Initial  Referral From Palliative care team  Consult/Referral To Chaplain  [facility] to Pall[facility] Care Team request[facility] informed [facility] in the process of [facility]. 3:20 PM Erline LevineMichael I Dnyla Antonetti

## 2015-05-02 NOTE — Consult Note (Signed)
[facility] Liaison: [facility] room available for patient today. Spoke with son by phone to confirm plan to transfer today. Plan to meet with him at 1:30 to complete registration paper work. Discharge summary has been faxed.   RN please call report to [phone].   Thank you.  Forrestine Himva Derryck Shahan, LCSW  [phone]

## 2015-05-02 NOTE — Progress Notes (Signed)
ANTICOAGULATION CONSULT NOTE  Pharmacy Consult for heparin Indication: atrial fibrillation - bridge therapy  Allergies  Allergen Reactions  . Amoxicillin Other (See Comments)    Can't remember reaction  . Doxycycline Other (See Comments)    Can't remember reaction  . Penicillins Other (See Comments)    Can't remember reaction  . Sulfa Antibiotics Other (See Comments)    Can't remember reaction    Patient Measurements: Height: 5\' 3"  (160 cm) Weight: 175 lb 4.3 oz (79.5 kg) IBW/kg (Calculated) : 52.4 Heparin Dosing Weight: 70kg   Vital Signs: Temp: 98.3 F (36.8 C) (11/30 0502) Temp Source: Oral (11/30 0502) BP: 132/56 mmHg (11/30 0502) Pulse Rate: 74 (11/30 0502)  Labs:  Recent Labs  04/30/15 0530 05/01/15 0457 05/02/15 0609  HGB 10.5* 11.0* 11.2*  HCT 34.7* 36.7 37.0  PLT 315 340 338  LABPROT 17.6* 16.9* 16.9*  INR 1.43 1.36 1.36  HEPARINUNFRC 0.38 0.34 0.35  CREATININE 0.69 0.59 0.72    Estimated Creatinine Clearance: 55 mL/min (by C-G formula based on Cr of 0.72).   Medical History: Past Medical History  Diagnosis Date  . Irregular heart beat   . Hypertension   . Hypercholesteremia   . Depression   . Atrial fibrillation (HCC)   . Bowel obstruction (HCC)   . S/P cardiac pacemaker procedure, 02/03/13, Medtronic pacer 02/04/2013    sick sinus syndrome  . CVA (cerebral infarction)   . COPD (chronic obstructive pulmonary disease) (HCC)   . Tobacco abuse     quit 2009  . History of echocardiogram 05/21/2012    TEE; EF 55-60%; mild grade 1 atherosclerosis of prox descending aorta & distal aortic arch  . History of nuclear stress test 11/07/2009    dipyridamole; normal pattern of perfusion; negative for ischemia; low risk   . Carotid artery disease (HCC)     carotid duplex 10/2012 - bilat ICAs normla patency with mod tortuosity of vessel, L vertebral artery demonstrated occlsuve disease    Assessment: [age] YOF presents with back pain with plans for  kyphoplasty 11/25.  This had to be cancelled d/t hypoxia and pulmonary edema per CT chest. She is on warfarin for PAF (CHADS-VASc = 7), pharmacy asked to bridge with heparin gtt.   Home warfarin dose 2.5mg  daily, Last dose 11/19  Today, 05/02/2015  Heparin level remains therapeutic with infusion at 1300 units/hr  CBC-Hgb 11.2 and pltc WNL  No interruptions in infusion or bleeding report per RN  Unable to proceed with kyphoplasty due to respiratory status.  Palliative care consulted.  Goal of Therapy:  Heparin level 0.3-0.7 units/ml Monitor platelets by anticoagulation protocol: Yes   Plan:   Continue heparin infusion at 1300 units/hr  Daily CBC and heparin level while on heparin infusion  F/u plans for anticoagulation. Resume warfarin?  D/c anticoagulation if focus is comfort?  Clance BollAmanda Jaymond Waage, PharmD, BCPS Pager: [phone] 05/02/2015 8:44 AM

## 2015-05-02 NOTE — Discharge Instructions (Signed)
Aspiration Pneumonia  Aspiration pneumonia is an infection in your lungs. It occurs when food, liquid, or stomach contents (vomit) are inhaled (aspirated) into your lungs. When these things get into your lungs, swelling (inflammation) and infection can occur. This can make it difficult for you to breathe. Aspiration pneumonia is a serious condition and can be life threatening. RISK FACTORS Aspiration pneumonia is more likely to occur when a person's cough (gag) reflex or ability to swallow has been decreased. Some things that can do this include:   Having a brain injury or disease, such as stroke, seizures, Parkinson's disease, dementia, or amyotrophic lateral sclerosis (ALS).   Being given general anesthetic for procedures.   Being in a coma (unconscious).   Having a narrowing of the tube that carries food to the stomach (esophagus).   Drinking too much alcohol. If a person passes out and vomits, vomit can be swallowed into the lungs.   Taking certain medicines, such as tranquilizers or sedatives.  SIGNS AND SYMPTOMS   Coughing after swallowing food or liquids.   Breathing problems, such as wheezing or shortness of breath.   Bluish skin. This can be caused by lack of oxygen.   Coughing up food or mucus. The mucus might contain blood, greenish material, or yellowish-white fluid (pus).   Fever.   Chest pain.   Being more tired than usual (fatigue).   Sweating more than usual.   Bad breath.  DIAGNOSIS  A physical exam will be done. During the exam, the health care provider will listen to your lungs with a stethoscope to check for:   Crackling sounds in the lungs.  Decreased breath sounds.  A rapid heartbeat. Various tests may be ordered. These may include:   Chest X-ray.   CT scan.   Swallowing study. This test looks at how food is swallowed and whether it goes into your breathing tube (trachea) or food pipe (esophagus).   Sputum culture. Saliva and  mucus (sputum) are collected from the lungs or the tubes that carry air to the lungs (bronchi). The sputum is then tested for bacteria.   Bronchoscopy. This test uses a flexible tube (bronchoscope) to see inside the lungs. TREATMENT  Treatment will usually include antibiotic medicines. Other medicines may also be used to reduce fever or pain. You may need to be treated in the hospital. In the hospital, your breathing will be carefully monitored. Depending on how well you are breathing, you may need to be given oxygen, or you may need breathing support from a breathing machine (ventilator). For people who fail a swallowing study, a feeding tube might be placed in the stomach, or they may be asked to avoid certain food textures or liquids when they eat. HOME CARE INSTRUCTIONS   Carefully follow any special eating instructions you were given, such as avoiding certain food textures or thickening liquids. This reduces the risk of developing aspiration pneumonia again.  Only take over-the-counter or prescription medicines as directed by your health care provider. Follow the directions carefully.   If you were prescribed antibiotics, take them as directed. Finish them even if you start to feel better.   Rest as instructed by your health care provider.   Keep all follow-up appointments with your health care provider.  SEEK MEDICAL CARE IF:   You develop worsening shortness of breath, wheezing, or difficulty breathing.   You develop a fever.   You have chest pain.  MAKE SURE YOU:   Understand these instructions.  Will watch   your condition.  Will get help right away if you are not doing well or get worse.   This information is not intended to replace advice given to you by your health care provider. Make sure you discuss any questions you have with your health care provider.   Document Released: 03/16/2009 Document Revised: 05/24/2013 Document Reviewed: 11/04/2012 Elsevier  Interactive Patient Education 2016 [employer]vier Inc.  

## 2015-05-02 NOTE — Progress Notes (Signed)
SLP Cancellation Note  Patient Details Name: [name] MRN: [id number] DOB: 05/31/1932   Cancelled treatment:       Reason Eval/Treat Not Completed: Other (comment) (pt to dc to [facility] per review of chart, focus on comfort, please reorder if indicated)   Donavan Burnetamara Curstin Schmale, MS [facility] SLP [phone]

## 2015-06-03 DEATH — deceased

## 2015-07-19 ENCOUNTER — Ambulatory Visit: Payer: Medicare Other | Admitting: Internal Medicine

## 2015-12-28 ENCOUNTER — Telehealth: Payer: Self-pay | Admitting: Cardiovascular Disease

## 2015-12-28 NOTE — Telephone Encounter (Signed)
Message routed to MD & Aram Beecham w/medical records

## 2015-12-28 NOTE — Telephone Encounter (Signed)
Pt expired on 06/03/15 not 05-08-16.

## 2015-12-28 NOTE — Telephone Encounter (Signed)
Received a letter to schedule an appointment. Pt passed away on June 07, 2016

## 2015-12-28 NOTE — Telephone Encounter (Signed)
Sad news. Please make sure device clinic is also aware.

## 2015-12-28 NOTE — Telephone Encounter (Signed)
Noted in [email or web address] 

## 2016-02-05 IMAGING — CT CT CHEST W/O CM
1 of 2 series · 15 of 32 positions shown, 19 images · non-contrast
Comparison: Chest CT 05/03/2007 and CT spine 04/17/2015

CLINICAL DATA: Three-week history of worsening mid back pain,
hypoxia.

EXAM:
CT CHEST WITHOUT CONTRAST
TECHNIQUE: Multidetector CT imaging of the chest was performed following the
standard protocol without IV contrast.

[Series 2: rtn chest without st · axial · non-contrast · 0.72mm/px · z∈[-240,+10]mm · 15 of 58 slices shown, 19 images]
[im 4/58  mediastinal]
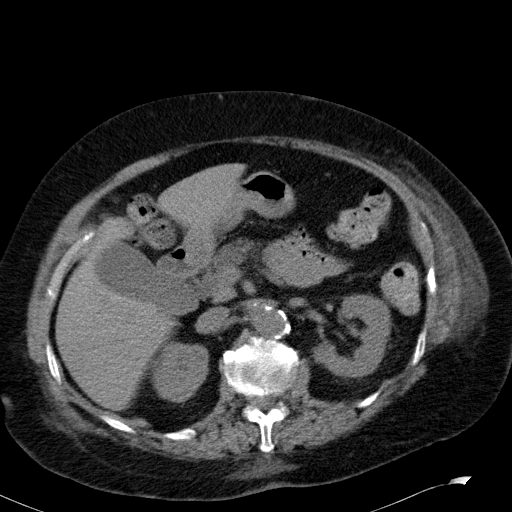
[im 4/58  lung]
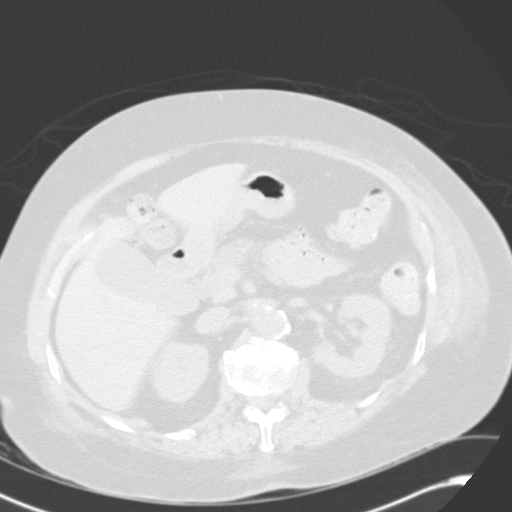
[im 8/58  lung]
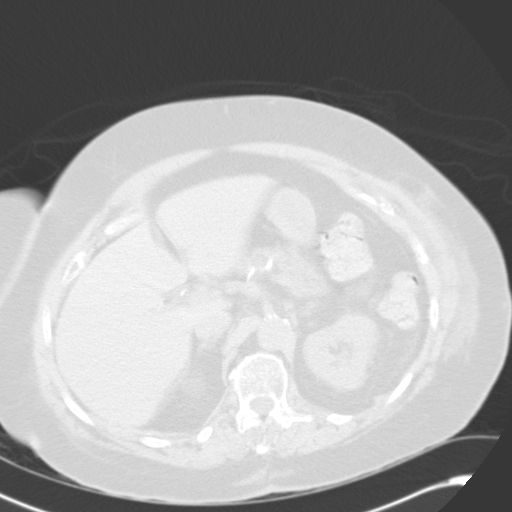
[im 12/58  lung]
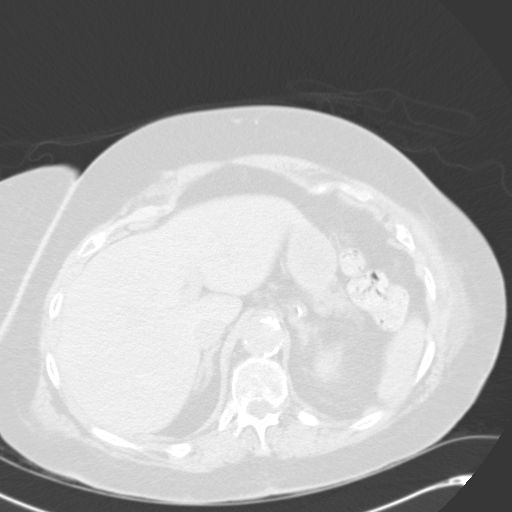
[im 16/58  lung]
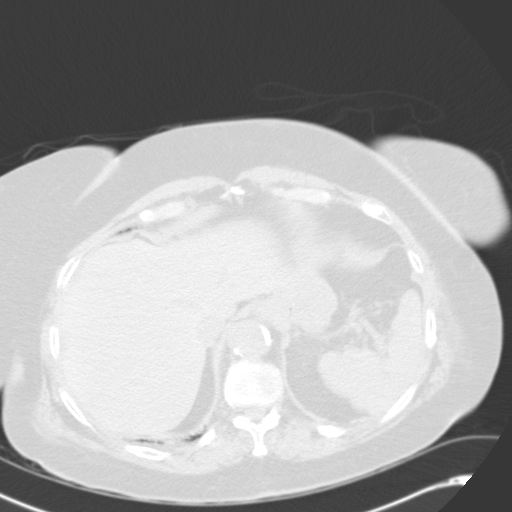
[im 20/58  mediastinal]
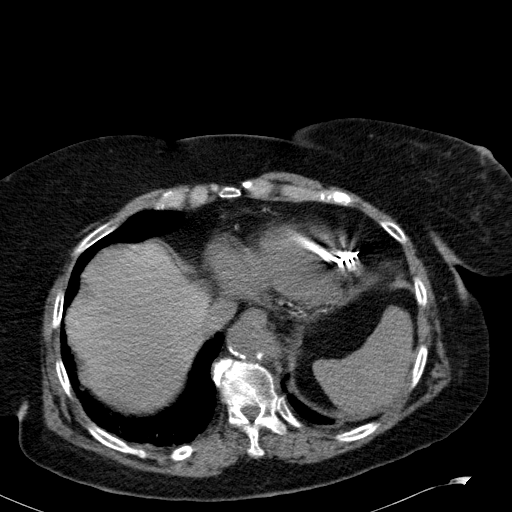
[im 20/58  lung]
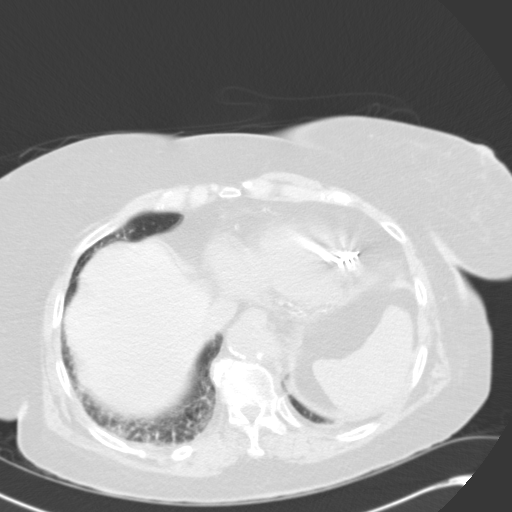
[im 23/58  lung]
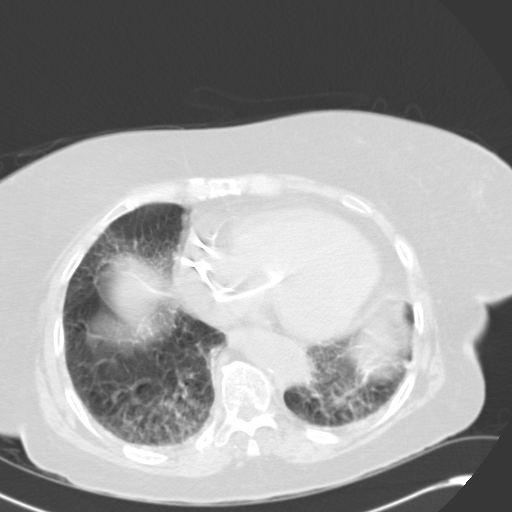
[im 27/58  lung]
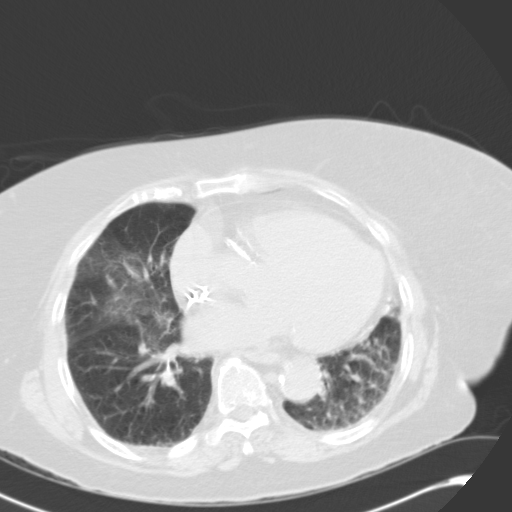
[im 29/58  lung]
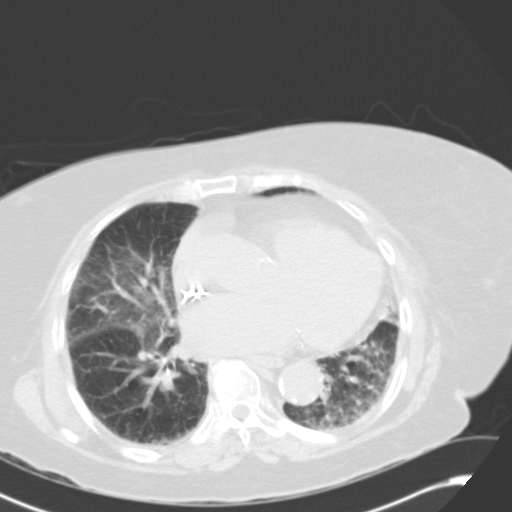
[im 31/58  mediastinal]
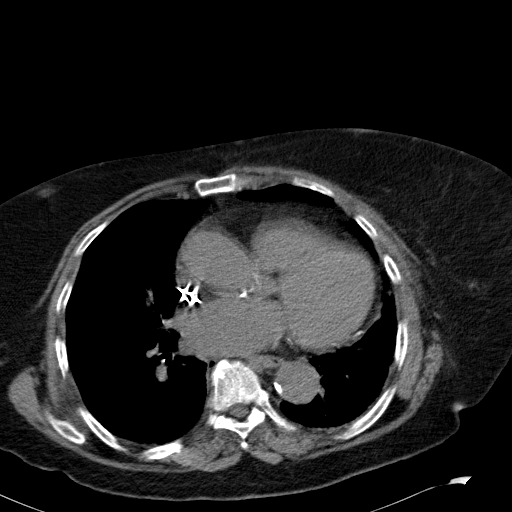
[im 31/58  lung]
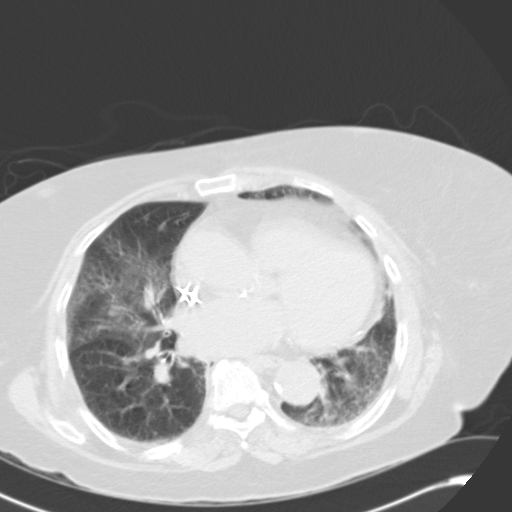
[im 35/58  lung]
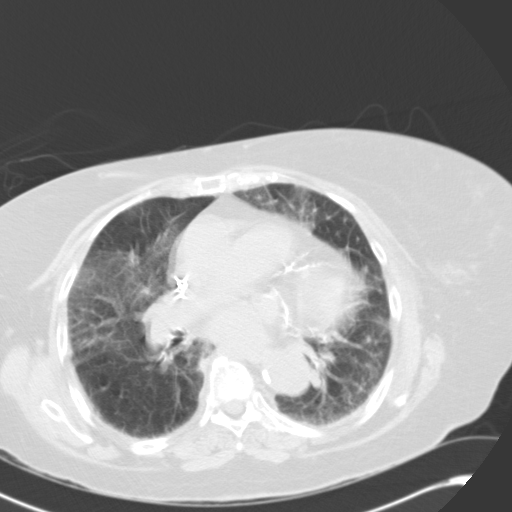
[im 39/58  lung]
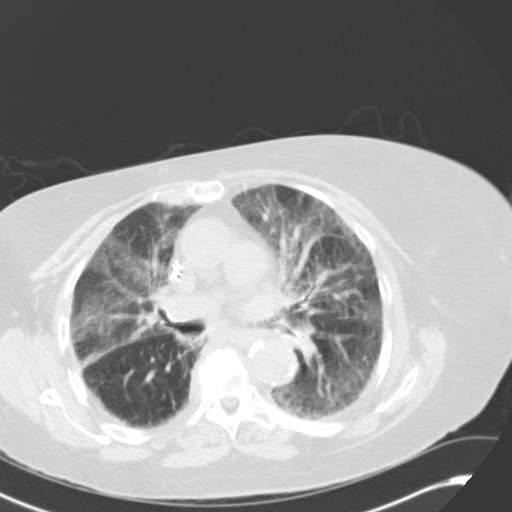
[im 42/58  lung]
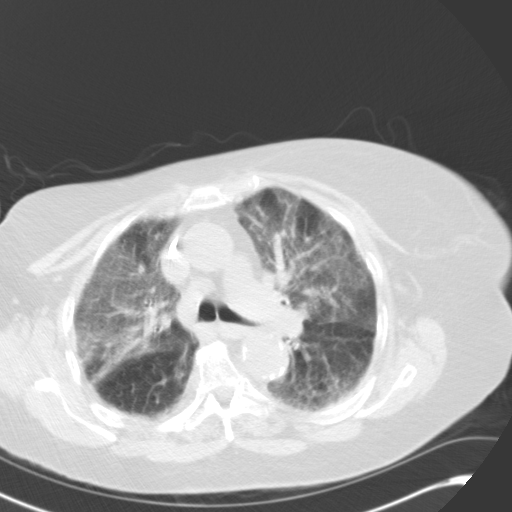
[im 46/58  mediastinal]
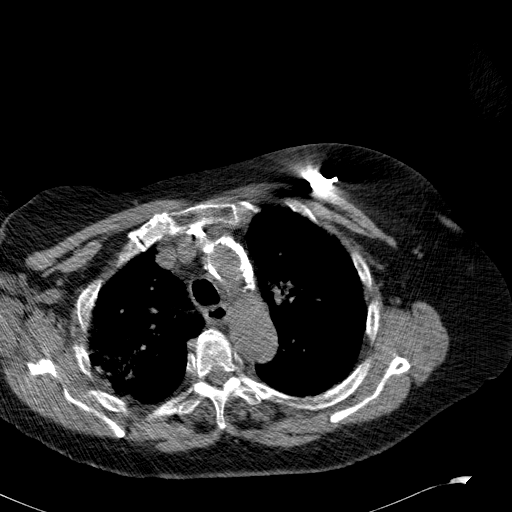
[im 46/58  lung]
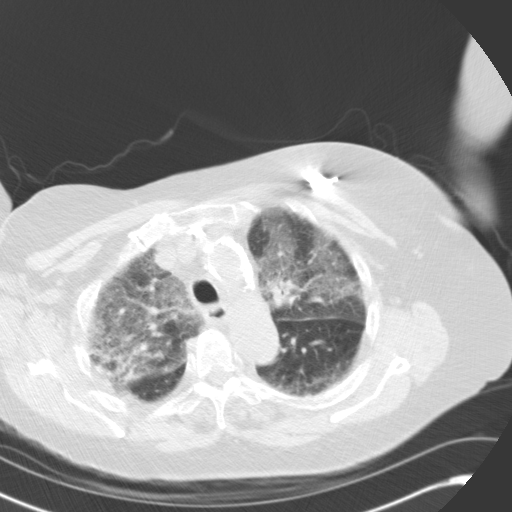
[im 50/58  lung]
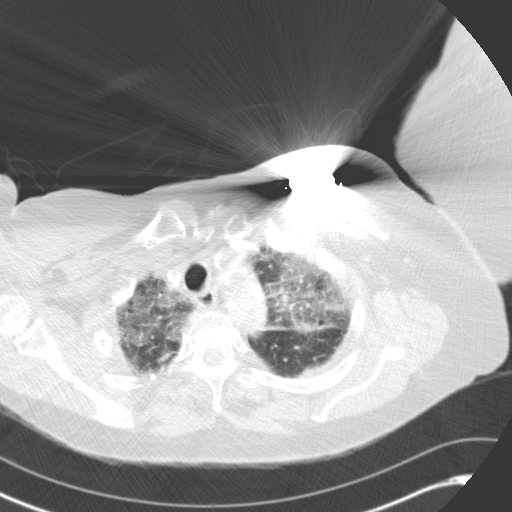
[im 54/58  lung]
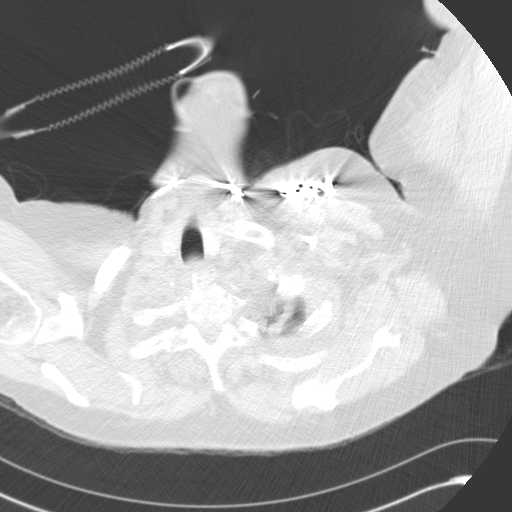

[15 of 32 positions shown; findings below may reference images not displayed]

FINDINGS: Lungs are adequately inflated and demonstrate a patchy bilateral
mixed interstitial airspace process most prominent over the upper
lobes. No evidence of effusion. Mild dependent debris along the left
posterior aspect of the trachea at the level of the sternal notch
likely aspirate material.

There is mild cardiomegaly. Pacemaker present over the left chest
wall. Calcified plaque over the coronary arteries. Moderate
calcified plaque over the thoracic aorta. 1 cm precarinal lymph node
unchanged likely reactive. No significant hilar or axillary
adenopathy. Remaining mediastinal structures are unremarkable.

Images through the upper abdomen demonstrate minimal diverticulosis
of the colon. Moderate atherosclerotic plaque over the abdominal
aorta. Severe thoracic spine compression fracture with mild interval
progression and minimal retropulsion of the posterior aspect of the
fracture. Mild degenerate change of the thoracic spine.
IMPRESSION: Patchy bilateral mixed interstitial airspace process most prominent
over the upper lobes likely infection. Mild debris within the
trachea as the pulmonary findings may be due to aspiration
pneumonia.

Severe thoracic spine compression fracture with mild interval
progression. Slight retropulsion of the posterior border of the
compression fracture.

Mild cardiomegaly.  Atherosclerotic coronary artery disease.

Mild diverticulosis of the colon.
